# Patient Record
Sex: Female | Born: 1980 | Race: Black or African American | Hispanic: No | Marital: Single | Smoking: Former smoker
Health system: Southern US, Community
[De-identification: ages and names within clinical notes are randomized; demographics above are authoritative.]

## PROBLEM LIST (undated history)

## (undated) DIAGNOSIS — I10 Essential (primary) hypertension: Secondary | ICD-10-CM

## (undated) DIAGNOSIS — Z8489 Family history of other specified conditions: Secondary | ICD-10-CM

## (undated) DIAGNOSIS — R000 Tachycardia, unspecified: Secondary | ICD-10-CM

## (undated) DIAGNOSIS — R1115 Cyclical vomiting syndrome unrelated to migraine: Secondary | ICD-10-CM

## (undated) HISTORY — PX: CHOLECYSTECTOMY: SHX55

---

## 2005-09-20 ENCOUNTER — Emergency Department: Payer: Self-pay | Admitting: Unknown Physician Specialty

## 2005-09-21 ENCOUNTER — Emergency Department: Payer: Self-pay | Admitting: Emergency Medicine

## 2005-10-08 ENCOUNTER — Ambulatory Visit: Payer: Self-pay | Admitting: Unknown Physician Specialty

## 2006-02-04 ENCOUNTER — Emergency Department: Payer: Self-pay | Admitting: Emergency Medicine

## 2006-02-08 ENCOUNTER — Emergency Department: Payer: Self-pay | Admitting: Emergency Medicine

## 2006-02-11 ENCOUNTER — Emergency Department: Payer: Self-pay | Admitting: Emergency Medicine

## 2006-02-12 ENCOUNTER — Emergency Department: Payer: Self-pay | Admitting: Emergency Medicine

## 2006-02-12 ENCOUNTER — Ambulatory Visit: Payer: Self-pay

## 2006-04-27 ENCOUNTER — Ambulatory Visit: Payer: Self-pay | Admitting: Gastroenterology

## 2006-05-03 ENCOUNTER — Inpatient Hospital Stay: Payer: Self-pay | Admitting: Internal Medicine

## 2006-06-09 ENCOUNTER — Ambulatory Visit: Payer: Self-pay | Admitting: Surgery

## 2006-08-07 ENCOUNTER — Emergency Department: Payer: Self-pay | Admitting: Emergency Medicine

## 2006-12-18 ENCOUNTER — Emergency Department: Payer: Self-pay

## 2007-04-09 ENCOUNTER — Emergency Department: Payer: Self-pay | Admitting: Emergency Medicine

## 2007-04-10 ENCOUNTER — Emergency Department: Payer: Self-pay | Admitting: Emergency Medicine

## 2007-04-11 ENCOUNTER — Emergency Department: Payer: Self-pay | Admitting: Emergency Medicine

## 2007-04-12 ENCOUNTER — Emergency Department: Payer: Self-pay | Admitting: Emergency Medicine

## 2007-04-29 ENCOUNTER — Emergency Department: Payer: Self-pay | Admitting: Emergency Medicine

## 2007-05-31 ENCOUNTER — Ambulatory Visit: Payer: Self-pay | Admitting: Gastroenterology

## 2007-06-27 ENCOUNTER — Emergency Department: Payer: Self-pay | Admitting: Emergency Medicine

## 2007-06-29 ENCOUNTER — Emergency Department: Payer: Self-pay | Admitting: Emergency Medicine

## 2007-07-01 ENCOUNTER — Emergency Department: Payer: Self-pay | Admitting: Internal Medicine

## 2008-06-23 ENCOUNTER — Emergency Department: Payer: Self-pay | Admitting: Emergency Medicine

## 2008-06-24 ENCOUNTER — Emergency Department: Payer: Self-pay | Admitting: Emergency Medicine

## 2008-06-25 ENCOUNTER — Emergency Department: Payer: Self-pay | Admitting: Emergency Medicine

## 2008-07-25 ENCOUNTER — Emergency Department: Payer: Self-pay | Admitting: Emergency Medicine

## 2008-07-25 ENCOUNTER — Emergency Department: Payer: Self-pay | Admitting: Internal Medicine

## 2008-09-30 ENCOUNTER — Emergency Department: Payer: Self-pay | Admitting: Unknown Physician Specialty

## 2008-10-30 DIAGNOSIS — I10 Essential (primary) hypertension: Secondary | ICD-10-CM | POA: Diagnosis present

## 2008-11-17 ENCOUNTER — Emergency Department: Payer: Self-pay | Admitting: Unknown Physician Specialty

## 2008-11-18 ENCOUNTER — Emergency Department: Payer: Self-pay | Admitting: Emergency Medicine

## 2008-11-19 ENCOUNTER — Emergency Department: Payer: Self-pay | Admitting: Unknown Physician Specialty

## 2008-12-22 ENCOUNTER — Emergency Department: Payer: Self-pay | Admitting: Emergency Medicine

## 2009-01-29 ENCOUNTER — Emergency Department: Payer: Self-pay | Admitting: Emergency Medicine

## 2009-08-14 ENCOUNTER — Emergency Department: Payer: Self-pay | Admitting: Emergency Medicine

## 2012-05-18 ENCOUNTER — Emergency Department: Payer: Self-pay

## 2012-05-18 LAB — CBC
HGB: 14.2 g/dL (ref 12.0–16.0)
MCH: 31 pg (ref 26.0–34.0)
MCHC: 32.9 g/dL (ref 32.0–36.0)
MCV: 94 fL (ref 80–100)
RBC: 4.57 10*6/uL (ref 3.80–5.20)

## 2012-05-18 LAB — COMPREHENSIVE METABOLIC PANEL
Albumin: 3.9 g/dL (ref 3.4–5.0)
Alkaline Phosphatase: 89 U/L (ref 50–136)
BUN: 11 mg/dL (ref 7–18)
Bilirubin,Total: 0.6 mg/dL (ref 0.2–1.0)
Calcium, Total: 8.8 mg/dL (ref 8.5–10.1)
Chloride: 108 mmol/L — ABNORMAL HIGH (ref 98–107)
EGFR (Non-African Amer.): >60
Glucose: 96 mg/dL (ref 65–99)
SGPT (ALT): 21 U/L (ref 12–78)
Sodium: 140 mmol/L (ref 136–145)

## 2012-05-18 LAB — URINALYSIS, COMPLETE
Bacteria: NONE SEEN
Bilirubin,UR: NEGATIVE
Nitrite: NEGATIVE
RBC,UR: 5 /HPF (ref 0–5)

## 2012-07-04 ENCOUNTER — Emergency Department: Payer: Self-pay | Admitting: Emergency Medicine

## 2012-07-04 LAB — CBC
HGB: 14.5 g/dL (ref 12.0–16.0)
MCHC: 34.3 g/dL (ref 32.0–36.0)
RBC: 4.65 10*6/uL (ref 3.80–5.20)
RDW: 14.7 % — ABNORMAL HIGH (ref 11.5–14.5)
WBC: 12.1 10*3/uL — ABNORMAL HIGH (ref 3.6–11.0)

## 2012-07-04 LAB — URINALYSIS, COMPLETE
Glucose,UR: NEGATIVE mg/dL (ref 0–75)
Leukocyte Esterase: NEGATIVE
Nitrite: NEGATIVE
Ph: 6 (ref 4.5–8.0)
Protein: 100
RBC,UR: 4 /HPF (ref 0–5)
Squamous Epithelial: 6
WBC UR: 2 /HPF (ref 0–5)

## 2012-07-04 LAB — COMPREHENSIVE METABOLIC PANEL
Albumin: 3.7 g/dL (ref 3.4–5.0)
Anion Gap: 8 (ref 7–16)
Bilirubin,Total: 0.6 mg/dL (ref 0.2–1.0)
Chloride: 108 mmol/L — ABNORMAL HIGH (ref 98–107)
Co2: 23 mmol/L (ref 21–32)
Creatinine: 0.58 mg/dL — ABNORMAL LOW (ref 0.60–1.30)
EGFR (African American): >60
Osmolality: 276 (ref 275–301)
Total Protein: 7.5 g/dL (ref 6.4–8.2)

## 2012-07-04 LAB — LIPASE, BLOOD: Lipase: 73 U/L (ref 73–393)

## 2012-07-04 LAB — PREGNANCY, URINE: Pregnancy Test, Urine: NEGATIVE m[IU]/mL

## 2012-08-03 ENCOUNTER — Emergency Department: Payer: Self-pay | Admitting: Emergency Medicine

## 2012-08-03 LAB — LIPASE, BLOOD: Lipase: 55 U/L — ABNORMAL LOW (ref 73–393)

## 2012-08-03 LAB — COMPREHENSIVE METABOLIC PANEL
Albumin: 4.1 g/dL (ref 3.4–5.0)
Alkaline Phosphatase: 81 U/L (ref 50–136)
Anion Gap: 10 (ref 7–16)
Calcium, Total: 9.7 mg/dL (ref 8.5–10.1)
Chloride: 104 mmol/L (ref 98–107)
Potassium: 3.4 mmol/L — ABNORMAL LOW (ref 3.5–5.1)

## 2012-08-03 LAB — CBC
HCT: 41.8 % (ref 35.0–47.0)
MCH: 30.8 pg (ref 26.0–34.0)
MCHC: 33.9 g/dL (ref 32.0–36.0)
MCV: 91 fL (ref 80–100)
RBC: 4.6 10*6/uL (ref 3.80–5.20)

## 2012-08-03 LAB — GC/CHLAMYDIA PROBE AMP

## 2012-08-03 LAB — URINALYSIS, COMPLETE
Bacteria: NONE SEEN
Bilirubin,UR: NEGATIVE
Blood: NEGATIVE
Leukocyte Esterase: NEGATIVE
Ph: 6 (ref 4.5–8.0)
Protein: 100
RBC,UR: 4 /HPF (ref 0–5)
Squamous Epithelial: 6

## 2012-08-03 LAB — WET PREP, GENITAL

## 2012-11-23 ENCOUNTER — Emergency Department: Payer: Self-pay | Admitting: Emergency Medicine

## 2012-11-23 LAB — COMPREHENSIVE METABOLIC PANEL
Albumin: 3.9 g/dL (ref 3.4–5.0)
BUN: 8 mg/dL (ref 7–18)
Bilirubin,Total: 0.5 mg/dL (ref 0.2–1.0)
Calcium, Total: 9.2 mg/dL (ref 8.5–10.1)
Chloride: 109 mmol/L — ABNORMAL HIGH (ref 98–107)
EGFR (African American): >60
Glucose: 96 mg/dL (ref 65–99)
Osmolality: 278 (ref 275–301)
SGPT (ALT): 16 U/L (ref 12–78)
Sodium: 140 mmol/L (ref 136–145)
Total Protein: 7.3 g/dL (ref 6.4–8.2)

## 2012-11-23 LAB — CBC
HCT: 41.2 % (ref 35.0–47.0)
HGB: 14.1 g/dL (ref 12.0–16.0)
MCHC: 34.2 g/dL (ref 32.0–36.0)
MCV: 93 fL (ref 80–100)
RBC: 4.42 10*6/uL (ref 3.80–5.20)
WBC: 12 10*3/uL — ABNORMAL HIGH (ref 3.6–11.0)

## 2012-11-23 LAB — URINALYSIS, COMPLETE
Blood: NEGATIVE
Leukocyte Esterase: NEGATIVE
Protein: 100
Specific Gravity: 1.029 (ref 1.003–1.030)
Squamous Epithelial: 3

## 2013-07-15 ENCOUNTER — Emergency Department: Payer: Self-pay | Admitting: Emergency Medicine

## 2013-07-15 LAB — CBC WITH DIFFERENTIAL/PLATELET
BASOS PCT: 0.6 %
Basophil #: 0.1 10*3/uL (ref 0.0–0.1)
EOS PCT: 0.3 %
Eosinophil #: 0 10*3/uL (ref 0.0–0.7)
HCT: 45.8 % (ref 35.0–47.0)
HGB: 15 g/dL (ref 12.0–16.0)
LYMPHS ABS: 2.1 10*3/uL (ref 1.0–3.6)
Lymphocyte %: 19.5 %
MCH: 31.2 pg (ref 26.0–34.0)
MCHC: 32.8 g/dL (ref 32.0–36.0)
MCV: 95 fL (ref 80–100)
Monocyte #: 0.6 x10 3/mm (ref 0.2–0.9)
Monocyte %: 5.5 %
Neutrophil #: 8.1 10*3/uL — ABNORMAL HIGH (ref 1.4–6.5)
Neutrophil %: 74.1 %
Platelet: 291 10*3/uL (ref 150–440)
RBC: 4.82 10*6/uL (ref 3.80–5.20)
RDW: 15 % — ABNORMAL HIGH (ref 11.5–14.5)
WBC: 11 10*3/uL (ref 3.6–11.0)

## 2013-07-15 LAB — COMPREHENSIVE METABOLIC PANEL
ALBUMIN: 4.5 g/dL (ref 3.4–5.0)
AST: 18 U/L (ref 15–37)
Alkaline Phosphatase: 81 U/L
Anion Gap: 12 (ref 7–16)
BILIRUBIN TOTAL: 0.7 mg/dL (ref 0.2–1.0)
BUN: 15 mg/dL (ref 7–18)
CALCIUM: 10.4 mg/dL — AB (ref 8.5–10.1)
CREATININE: 0.84 mg/dL (ref 0.60–1.30)
Chloride: 105 mmol/L (ref 98–107)
Co2: 23 mmol/L (ref 21–32)
EGFR (African American): >60
EGFR (Non-African Amer.): >60
GLUCOSE: 118 mg/dL — AB (ref 65–99)
OSMOLALITY: 281 (ref 275–301)
POTASSIUM: 3.9 mmol/L (ref 3.5–5.1)
SGPT (ALT): 17 U/L (ref 12–78)
Sodium: 140 mmol/L (ref 136–145)
Total Protein: 8.5 g/dL — ABNORMAL HIGH (ref 6.4–8.2)

## 2013-07-15 LAB — URINALYSIS, COMPLETE
Bilirubin,UR: NEGATIVE
Blood: NEGATIVE
GLUCOSE, UR: NEGATIVE mg/dL (ref 0–75)
Ketone: NEGATIVE
Leukocyte Esterase: NEGATIVE
NITRITE: NEGATIVE
PH: 6 (ref 4.5–8.0)
SPECIFIC GRAVITY: 1.024 (ref 1.003–1.030)
Squamous Epithelial: 3
WBC UR: 2 /HPF (ref 0–5)

## 2014-01-15 ENCOUNTER — Emergency Department: Payer: Self-pay | Admitting: Emergency Medicine

## 2014-01-16 LAB — COMPREHENSIVE METABOLIC PANEL
ALT: 20 U/L
AST: 24 U/L (ref 15–37)
Albumin: 3.9 g/dL (ref 3.4–5.0)
Alkaline Phosphatase: 71 U/L
Anion Gap: 11 (ref 7–16)
BUN: 17 mg/dL (ref 7–18)
Bilirubin,Total: 0.5 mg/dL (ref 0.2–1.0)
CHLORIDE: 113 mmol/L — AB (ref 98–107)
Calcium, Total: 8.7 mg/dL (ref 8.5–10.1)
Co2: 24 mmol/L (ref 21–32)
Creatinine: 0.8 mg/dL (ref 0.60–1.30)
EGFR (African American): >60
EGFR (Non-African Amer.): >60
Glucose: 96 mg/dL (ref 65–99)
OSMOLALITY: 296 (ref 275–301)
Potassium: 3.6 mmol/L (ref 3.5–5.1)
SODIUM: 148 mmol/L — AB (ref 136–145)
TOTAL PROTEIN: 8 g/dL (ref 6.4–8.2)

## 2014-01-16 LAB — CBC
HCT: 39 % (ref 35.0–47.0)
HGB: 12.7 g/dL (ref 12.0–16.0)
MCH: 31.4 pg (ref 26.0–34.0)
MCHC: 32.5 g/dL (ref 32.0–36.0)
MCV: 97 fL (ref 80–100)
Platelet: 272 10*3/uL (ref 150–440)
RBC: 4.04 10*6/uL (ref 3.80–5.20)
RDW: 14.5 % (ref 11.5–14.5)
WBC: 22.7 10*3/uL — ABNORMAL HIGH (ref 3.6–11.0)

## 2014-01-16 LAB — LIPASE, BLOOD: Lipase: 63 U/L — ABNORMAL LOW (ref 73–393)

## 2014-01-16 LAB — TROPONIN I: Troponin-I: <0.02 ng/mL

## 2014-02-12 ENCOUNTER — Emergency Department: Payer: Self-pay | Admitting: Emergency Medicine

## 2014-03-26 ENCOUNTER — Emergency Department: Payer: Self-pay | Admitting: Emergency Medicine

## 2014-03-26 LAB — CBC WITH DIFFERENTIAL/PLATELET
COMMENT - H1-COM1: NORMAL
Eosinophil: 1 %
HCT: 43.1 % (ref 35.0–47.0)
HGB: 14.3 g/dL (ref 12.0–16.0)
Lymphocytes: 33 %
MCH: 31.4 pg (ref 26.0–34.0)
MCHC: 33.2 g/dL (ref 32.0–36.0)
MCV: 95 fL (ref 80–100)
Monocytes: 5 %
Platelet: 284 10*3/uL (ref 150–440)
RBC: 4.56 10*6/uL (ref 3.80–5.20)
RDW: 14.6 % — ABNORMAL HIGH (ref 11.5–14.5)
Segmented Neutrophils: 61 %
WBC: 11.1 10*3/uL — ABNORMAL HIGH (ref 3.6–11.0)

## 2014-03-26 LAB — COMPREHENSIVE METABOLIC PANEL
ALK PHOS: 70 U/L (ref 46–116)
Albumin: 3.6 g/dL (ref 3.4–5.0)
Anion Gap: 7 (ref 7–16)
BILIRUBIN TOTAL: 0.4 mg/dL (ref 0.2–1.0)
BUN: 11 mg/dL (ref 7–18)
CO2: 25 mmol/L (ref 21–32)
Calcium, Total: 9.5 mg/dL (ref 8.5–10.1)
Chloride: 107 mmol/L (ref 98–107)
Creatinine: 0.65 mg/dL (ref 0.60–1.30)
EGFR (African American): >60
Glucose: 74 mg/dL (ref 65–99)
Osmolality: 276 (ref 275–301)
Potassium: 3.8 mmol/L (ref 3.5–5.1)
SGOT(AST): 29 U/L (ref 15–37)
SGPT (ALT): 22 U/L (ref 14–63)
SODIUM: 139 mmol/L (ref 136–145)
TOTAL PROTEIN: 7.6 g/dL (ref 6.4–8.2)

## 2014-03-26 LAB — URINALYSIS, COMPLETE
BLOOD: NEGATIVE
Bacteria: NONE SEEN
Bilirubin,UR: NEGATIVE
GLUCOSE, UR: NEGATIVE mg/dL (ref 0–75)
Ketone: NEGATIVE
Leukocyte Esterase: NEGATIVE
Nitrite: NEGATIVE
PH: 6 (ref 4.5–8.0)
Protein: NEGATIVE
RBC,UR: 1 /HPF (ref 0–5)
SPECIFIC GRAVITY: 1.018 (ref 1.003–1.030)
WBC UR: 2 /HPF (ref 0–5)

## 2014-03-26 LAB — HCG, QUANTITATIVE, PREGNANCY: BETA HCG, QUANT.: 146 m[IU]/mL — AB

## 2014-03-26 LAB — LIPASE, BLOOD: LIPASE: 120 U/L (ref 73–393)

## 2014-04-08 ENCOUNTER — Emergency Department: Payer: Self-pay | Admitting: Internal Medicine

## 2014-04-09 ENCOUNTER — Emergency Department: Payer: Self-pay | Admitting: Emergency Medicine

## 2014-11-18 ENCOUNTER — Emergency Department
Admission: EM | Admit: 2014-11-18 | Discharge: 2014-11-18 | Disposition: A | Discharge status: Home / Self Care | Payer: Self-pay | Attending: Emergency Medicine | Admitting: Emergency Medicine

## 2014-11-18 DIAGNOSIS — Z3202 Encounter for pregnancy test, result negative: Secondary | ICD-10-CM | POA: Insufficient documentation

## 2014-11-18 DIAGNOSIS — G43D0 Abdominal migraine, not intractable: Secondary | ICD-10-CM | POA: Insufficient documentation

## 2014-11-18 LAB — CBC
HCT: 44.8 % (ref 35.0–47.0)
Hemoglobin: 14.8 g/dL (ref 12.0–16.0)
MCH: 31 pg (ref 26.0–34.0)
MCHC: 32.9 g/dL (ref 32.0–36.0)
MCV: 94 fL (ref 80.0–100.0)
PLATELETS: 264 10*3/uL (ref 150–440)
RBC: 4.77 MIL/uL (ref 3.80–5.20)
RDW: 14.5 % (ref 11.5–14.5)
WBC: 9.9 10*3/uL (ref 3.6–11.0)

## 2014-11-18 LAB — COMPREHENSIVE METABOLIC PANEL
ALBUMIN: 4.3 g/dL (ref 3.5–5.0)
ALK PHOS: 71 U/L (ref 38–126)
ALT: 12 U/L — AB (ref 14–54)
AST: 19 U/L (ref 15–41)
Anion gap: 10 (ref 5–15)
BILIRUBIN TOTAL: 0.5 mg/dL (ref 0.3–1.2)
BUN: 17 mg/dL (ref 6–20)
CALCIUM: 9.4 mg/dL (ref 8.9–10.3)
CO2: 21 mmol/L — AB (ref 22–32)
CREATININE: 0.82 mg/dL (ref 0.44–1.00)
Chloride: 108 mmol/L (ref 101–111)
GFR calc Af Amer: >60 mL/min (ref >60)
GFR calc non Af Amer: >60 mL/min (ref >60)
GLUCOSE: 101 mg/dL — AB (ref 65–99)
Potassium: 4.1 mmol/L (ref 3.5–5.1)
SODIUM: 139 mmol/L (ref 135–145)
TOTAL PROTEIN: 8.1 g/dL (ref 6.5–8.1)

## 2014-11-18 LAB — URINALYSIS COMPLETE WITH MICROSCOPIC (ARMC ONLY)
BILIRUBIN URINE: NEGATIVE
Bacteria, UA: NONE SEEN
GLUCOSE, UA: NEGATIVE mg/dL
KETONES UR: NEGATIVE mg/dL
Leukocytes, UA: NEGATIVE
Nitrite: NEGATIVE
PROTEIN: 30 mg/dL — AB
Specific Gravity, Urine: 1.029 (ref 1.005–1.030)
pH: 5 (ref 5.0–8.0)

## 2014-11-18 LAB — LIPASE, BLOOD: Lipase: 19 U/L — ABNORMAL LOW (ref 22–51)

## 2014-11-18 LAB — POCT PREGNANCY, URINE: Preg Test, Ur: NEGATIVE

## 2014-11-18 MED ORDER — PROMETHAZINE HCL 12.5 MG RE SUPP: 12.5000 mg | Freq: Four times a day (QID) | RECTAL | Status: DC | PRN

## 2014-11-18 MED ORDER — PROMETHAZINE HCL 25 MG/ML IJ SOLN
12.5000 mg | Freq: Once | INTRAMUSCULAR | Status: AC
  Administered 2014-11-18: 12.5 mg via INTRAVENOUS
  Filled 2014-11-18: qty 1

## 2014-11-18 MED ORDER — LORAZEPAM 2 MG/ML IJ SOLN
0.5000 mg | Freq: Once | INTRAMUSCULAR | Status: AC
  Administered 2014-11-18: 0.5 mg via INTRAVENOUS
  Filled 2014-11-18: qty 1

## 2014-11-18 MED ORDER — SODIUM CHLORIDE 0.9 % IV BOLUS (SEPSIS)
1000.0000 mL | Freq: Once | INTRAVENOUS | Status: AC
  Administered 2014-11-18: 1000 mL via INTRAVENOUS

## 2014-11-18 NOTE — ED Notes (Signed)
Pt tolerated PO fluids well, no emesis noted per pt, informed Dr Huel Cote.

## 2014-11-18 NOTE — ED Notes (Signed)
Pt reports that she has cyclic vomiting syndrome and since Friday has been having vomiting. Pt also  Reports that she has been having migraine headaches.

## 2014-11-18 NOTE — Discharge Instructions (Signed)
Abdominal Migraine Abdominal migraine is one of several types of migraine. It is one example of "periodic syndrome". The periodic type more commonly occurs in children. Such children usually have a family history of migraine. Children may go on to develop typical migraines later in their lives.  SYMPTOMS  The attacks usually include intermittent periods of abdominal pain. Along with the abdominal pain, other symptoms may occur such as:  Nausea.  Vomiting.  Intense blushing or reddening of the skin (flushing).  Pale appearance to the skin (pallor). DIAGNOSIS  Tests may be done to look for other possible conditions. TREATMENT  Medications that are useful in treating migraine may also work to control these attacks in most children. Document Released: 05/02/2003 Document Revised: 06/06/2012 Document Reviewed: 09/28/2007 [facility] Patient Information 2015 [place], [place]. This information is not intended to replace advice given to you by your health care provider. Make sure you discuss any questions you have with your health care provider.  Please return immediately if condition worsens. Please contact her primary physician or the physician you were given for referral. If you have any specialist physicians involved in her treatment and plan please also contact them. Thank you for using [facility].

## 2014-11-18 NOTE — ED Provider Notes (Signed)
Time Seen: Approximately 1430 I have reviewed the triage notes  Chief Complaint: Emesis   History of Present Illness: Theresa Carpenter is a 34 y.o. female who presents with acute episode of cyclic vomiting with a long history of abdominal migraines and cyclic vomiting syndrome. Followed by gastroenterologist at [facility] and took her typical Xanax and Maxalt without any successful relief of the nausea and vomiting etc. Patient denies any hematemesis or biliary emesis. She denies any fever. She denies any diarrhea or constipation. Occasional crampy abdominal pain which was typical of her previous episodes of intestinal migraines. She thought there may be a risk of being pregnant. She denies any vaginal discharge or bleeding.   No past medical history on file.  There are no active problems to display for this patient.   No past surgical history on file.  No past surgical history on file.  No current outpatient prescriptions on file.  Allergies:  Review of patient's allergies indicates no known allergies.  Family History: No family history on file.  Social History: Social History  Substance Use Topics  . Smoking status: Not on file  . Smokeless tobacco: Not on file  . Alcohol Use: Not on file     Review of Systems:   10 point review of systems was performed and was otherwise negative:  Constitutional: No fever Eyes: No visual disturbances ENT: No sore throat, ear pain Cardiac: No chest pain Respiratory: No shortness of breath, wheezing, or stridor Abdomen: No abdominal pain, no vomiting, No diarrhea Endocrine: No weight loss, No night sweats Extremities: No peripheral edema, cyanosis Skin: No rashes, easy bruising Neurologic: No focal weakness, trouble with speech or swollowing Urologic: No dysuria, Hematuria, or urinary frequency   Physical Exam:  ED Triage Vitals  Enc Vitals Group     BP 11/18/14 1251 138/80 mmHg     Pulse Rate 11/18/14 1251 138     Resp 11/18/14  1251 20     Temp 11/18/14 1251 98.2 F (36.8 C)     Temp Source 11/18/14 1251 Oral     SpO2 11/18/14 1251 97 %     Weight 11/18/14 1251 150 lb (68.04 kg)     Height 11/18/14 1251  (1.6 m)     Head Cir --      Peak Flow --      Pain Score 11/18/14 1329 10     Pain Loc --      Pain Edu? --      Excl. in GC? --     General: Awake , Alert , and Oriented times 3; GCS 15 Head: Normal cephalic , atraumatic Eyes: Pupils equal , round, reactive to light Nose/Throat: No nasal drainage, patent upper airway without erythema or exudate.  Neck: Supple, Full range of motion, No anterior adenopathy or palpable thyroid masses Lungs: Clear to ascultation without wheezes , rhonchi, or rales Heart: Regular rate, regular rhythm without murmurs , gallops , or rubs Abdomen: Soft, non tender without rebound, guarding , or rigidity; bowel sounds positive and symmetric in all 4 quadrants. No organomegaly .        Extremities: 2 plus symmetric pulses. No edema, clubbing or cyanosis Neurologic: normal ambulation, Motor symmetric without deficits, sensory intact Skin: warm, dry, no rashes   Labs:   All laboratory work was reviewed including any pertinent negatives or positives listed below:  Labs Reviewed  LIPASE, BLOOD - Abnormal; Notable for the following:    Lipase 19 (*)  All other components within normal limits  COMPREHENSIVE METABOLIC PANEL - Abnormal; Notable for the following:    CO2 21 (*)    Glucose, Bld 101 (*)    ALT 12 (*)    All other components within normal limits  URINALYSIS COMPLETEWITH MICROSCOPIC ([facility] ONLY) - Abnormal; Notable for the following:    Color, Urine YELLOW (*)    APPearance HAZY (*)    Hgb urine dipstick 2+ (*)    Protein, ur 30 (*)    Squamous Epithelial / LPF 0-5 (*)    All other components within normal limits  CBC  POC URINE PREG, ED  POCT PREGNANCY, URINE   review of patient's laboratory work shows no significant abnormalities.      ED  Course: Reviewed the patient's medical record shows that she has a history of substance abuse and was concerned about giving her any form of narcotic and wanted to be careful with any benzodiazepine therapy. Patient does appear to be of understanding when discussing this issue at the bedside. The patient will be given IV fluid along with IV low-dose Ativan and Phenergan 12.5 mg IV. Patient appears to have medication at home and does not appear to have any complications from the cyclic vomiting. The patient does not appear to have any signs or symptoms consistent with an acute abdomen. Her exam is benign at this time and she's had a long history of intestinal migraines.   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 Patient was advised to follow up with her primary care physician or other specialized physicians involved and in their current assessment. I prescribed the patient's Phenergan suppositories and she's been advised continue her normal medication and contact her gastroenterologist on Monday for any further advisement.           Jennye Moccasin, MD 11/18/14 [phone]

## 2014-11-28 ENCOUNTER — Emergency Department: Payer: Self-pay

## 2014-11-28 ENCOUNTER — Emergency Department
Admission: EM | Admit: 2014-11-28 | Discharge: 2014-11-28 | Disposition: A | Discharge status: Home / Self Care | Payer: Self-pay | Attending: Emergency Medicine | Admitting: Emergency Medicine

## 2014-11-28 ENCOUNTER — Encounter: Payer: Self-pay | Admitting: Emergency Medicine

## 2014-11-28 DIAGNOSIS — I10 Essential (primary) hypertension: Secondary | ICD-10-CM | POA: Insufficient documentation

## 2014-11-28 DIAGNOSIS — Z3202 Encounter for pregnancy test, result negative: Secondary | ICD-10-CM | POA: Insufficient documentation

## 2014-11-28 DIAGNOSIS — N760 Acute vaginitis: Secondary | ICD-10-CM | POA: Insufficient documentation

## 2014-11-28 DIAGNOSIS — R102 Pelvic and perineal pain: Secondary | ICD-10-CM

## 2014-11-28 DIAGNOSIS — B9689 Other specified bacterial agents as the cause of diseases classified elsewhere: Secondary | ICD-10-CM

## 2014-11-28 DIAGNOSIS — Z720 Tobacco use: Secondary | ICD-10-CM | POA: Insufficient documentation

## 2014-11-28 HISTORY — DX: Essential (primary) hypertension: I10

## 2014-11-28 HISTORY — DX: Cyclical vomiting syndrome unrelated to migraine: R11.15

## 2014-11-28 LAB — URINALYSIS COMPLETE WITH MICROSCOPIC (ARMC ONLY)
BACTERIA UA: NONE SEEN
Bilirubin Urine: NEGATIVE
Glucose, UA: NEGATIVE mg/dL
Ketones, ur: NEGATIVE mg/dL
Nitrite: NEGATIVE
PROTEIN: NEGATIVE mg/dL
Specific Gravity, Urine: 1.023 (ref 1.005–1.030)
pH: 6 (ref 5.0–8.0)

## 2014-11-28 LAB — CHLAMYDIA/NGC RT PCR (ARMC ONLY)
Chlamydia Tr: NOT DETECTED
N gonorrhoeae: NOT DETECTED

## 2014-11-28 LAB — WET PREP, GENITAL
Trich, Wet Prep: NONE SEEN
WBC WET PREP: NONE SEEN
YEAST WET PREP: NONE SEEN

## 2014-11-28 LAB — POCT PREGNANCY, URINE: Preg Test, Ur: NEGATIVE

## 2014-11-28 MED ORDER — METRONIDAZOLE 500 MG PO TABS: 2000.0000 mg | ORAL_TABLET | Freq: Two times a day (BID) | ORAL | Status: AC

## 2014-11-28 MED ORDER — METRONIDAZOLE 250 MG PO TABS
500.0000 mg | ORAL_TABLET | Freq: Two times a day (BID) | ORAL | Status: DC
  Administered 2014-11-28: 500 mg via ORAL
  Filled 2014-11-28: qty 2

## 2014-11-28 NOTE — ED Notes (Signed)
NAD noted at this time. Pt states she has a hx of UTI's. Pt states burning and frequency x 3 days. Pt states she has been taking AZO, cranberry juice, and increased water intake over the last 3 days with no relief. Pt states she thinks she may have an STD. Pt denies D/C and bleeding at this time.

## 2014-11-28 NOTE — ED Provider Notes (Signed)
[facility] Emergency Department Provider Note ____________________________________________  Time seen: 1920  I have reviewed the triage vital signs and the nursing notes.  HISTORY  Chief Complaint  Urinary Tract Infection and SEXUALLY TRANSMITTED DISEASE  HPI Theresa Carpenter is a 34 y.o. female reports to the ED for evaluation of some dysuria over the last 2 weeks. As well as some vaginal pain over the last week. She reports using AZO and drinking cranberry juice for her urinary symptoms. She denies any fevers, chills, sweats, or nausea or vomiting. Just denies any significant vaginal discharge or irregular bleeding. She rates her vaginal discomfort that she localizes to the vagina, as a 9/10 in triage. Symptoms are aggravated by intercourse.  Past Medical History  Diagnosis Date  . Hypertension   . Cyclic vomiting syndrome     There are no active problems to display for this patient.   Past Surgical History  Procedure Laterality Date  . Cholecystectomy      Current Outpatient Rx  Name  Route  Sig  Dispense  Refill  . metroNIDAZOLE (FLAGYL) 500 MG tablet   Oral   Take 4 tablets (2,000 mg total) by mouth 2 (two) times daily.   13 tablet   0   . promethazine (PHENERGAN) 12.5 MG suppository   Rectal   Place 1 suppository (12.5 mg total) rectally every 6 (six) hours as needed for nausea or vomiting.   12 each   0    Allergies Review of patient's allergies indicates no known allergies.  History reviewed. No pertinent family history.  Social History Social History  Substance Use Topics  . Smoking status: Current Every Day Smoker -- 0.30 packs/day    Types: Cigarettes  . Smokeless tobacco: Never Used  . Alcohol Use: No   Review of Systems  Constitutional: Negative for fever. Eyes: Negative for visual changes. ENT: Negative for sore throat. Cardiovascular: Negative for chest pain. Respiratory: Negative for shortness of  breath. Gastrointestinal: Negative for abdominal pain, vomiting and diarrhea. Genitourinary: Negative for dysuria. Vulvovaginal pain as above. Musculoskeletal: Negative for back pain. Skin: Negative for rash. Neurological: Negative for headaches, focal weakness or numbness. ____________________________________________  PHYSICAL EXAM:  VITAL SIGNS: ED Triage Vitals  Enc Vitals Group     BP 11/28/14 1843 151/107 mmHg     Pulse Rate 11/28/14 1843 116     Resp 11/28/14 1837 20     Temp 11/28/14 1837 98 F (36.7 C)     Temp Source 11/28/14 1837 Oral     SpO2 11/28/14 1837 100 %     Weight 11/28/14 1843 150 lb (68.04 kg)     Height 11/28/14 1843  (1.6 m)     Head Cir --      Peak Flow --      Pain Score 11/28/14 1844 7     Pain Loc --      Pain Edu? --      Excl. in GC? --    Constitutional: Alert and oriented. Well appearing and in no distress. Eyes: Conjunctivae are normal. PERRL. Normal extraocular movements. ENT   Head: Normocephalic and atraumatic.   Nose: No congestion/rhinorrhea.   Mouth/Throat: Mucous membranes are moist.   Neck: Supple. No thyromegaly. Hematological/Lymphatic/Immunological: No cervical lymphadenopathy. Cardiovascular: Normal rate, regular rhythm.  Respiratory: Normal respiratory effort. No wheezes/rales/rhonchi. Gastrointestinal: Soft and nontender. No distention. GU: Normal external genitalia. Vaginal exam without any significant vaginal discharge. Cervix is closed and nonfriable. No adnexal masses or  CMT noted. Musculoskeletal: Nontender with normal range of motion in all extremities.  Neurologic:  Normal gait without ataxia. Normal speech and language. No gross focal neurologic deficits are appreciated. Skin:  Skin is warm, dry and intact. No rash noted. Psychiatric: Mood and affect are normal. Patient exhibits appropriate insight and judgment. ____________________________________________   LABS (pertinent  positives/negatives) Labs Reviewed  WET PREP, GENITAL - Abnormal; Notable for the following:    Clue Cells Wet Prep HPF POC FEW (*)    All other components within normal limits  URINALYSIS COMPLETEWITH MICROSCOPIC ([facility] ONLY) - Abnormal; Notable for the following:    Color, Urine YELLOW (*)    APPearance CLOUDY (*)    Hgb urine dipstick 1+ (*)    Leukocytes, UA TRACE (*)    Squamous Epithelial / LPF 6-30 (*)    All other components within normal limits  CHLAMYDIA/NGC RT PCR ([facility] ONLY)  POCT PREGNANCY, URINE  ____________________________________________   RADIOLOGY  Pelvic/Transvaginal US IMPRESSION: Study within normal limits. ____________________________________________  PROCEDURES  Flagyl 500 mg PO ____________________________________________  INITIAL IMPRESSION / ASSESSMENT AND PLAN / ED COURSE  Patient with laboratory evaluation of bacterial vaginosis. She has a negative ultrasound, without indication of endometriosis, fibroids, or ovarian cyst. She'll be discharged with Flagyl to dose as directed. She'll follow with the primary care provider or the [facility] Department as needed. ____________________________________________  FINAL CLINICAL IMPRESSION(S) / ED DIAGNOSES  Final diagnoses:  Pelvic pain in female  BV (bacterial vaginosis)      Lissa Hoard, PA-C 11/29/14 0007  Darien Ramus, MD 12/05/14 [phone]

## 2014-11-28 NOTE — Discharge Instructions (Signed)
Bacterial Vaginosis Bacterial vaginosis is a vaginal infection that occurs when the normal balance of bacteria in the vagina is disrupted. It results from an overgrowth of certain bacteria. This is the most common vaginal infection in women of childbearing age. Treatment is important to prevent complications, especially in pregnant women, as it can cause a premature delivery. CAUSES  Bacterial vaginosis is caused by an increase in harmful bacteria that are normally present in smaller amounts in the vagina. Several different kinds of bacteria can cause bacterial vaginosis. However, the reason that the condition develops is not fully understood. RISK FACTORS Certain activities or behaviors can put you at an increased risk of developing bacterial vaginosis, including:  Having a new sex partner or multiple sex partners.  Douching.  Using an intrauterine device (IUD) for contraception. Women do not get bacterial vaginosis from toilet seats, bedding, swimming pools, or contact with objects around them. SIGNS AND SYMPTOMS  Some women with bacterial vaginosis have no signs or symptoms. Common symptoms include:  Grey vaginal discharge.  A fishlike odor with discharge, especially after sexual intercourse.  Itching or burning of the vagina and vulva.  Burning or pain with urination. DIAGNOSIS  Your health care provider will take a medical history and examine the vagina for signs of bacterial vaginosis. A sample of vaginal fluid may be taken. Your health care provider will look at this sample under a microscope to check for bacteria and abnormal cells. A vaginal pH test may also be done.  TREATMENT  Bacterial vaginosis may be treated with antibiotic medicines. These may be given in the form of a pill or a vaginal cream. A second round of antibiotics may be prescribed if the condition comes back after treatment. Because bacterial vaginosis increases your risk for sexually transmitted diseases, getting  treated can help reduce your risk for chlamydia, gonorrhea, HIV, and herpes. HOME CARE INSTRUCTIONS   Only take over-the-counter or prescription medicines as directed by your health care provider.  If antibiotic medicine was prescribed, take it as directed. Make sure you finish it even if you start to feel better.  Tell all sexual partners that you have a vaginal infection. They should see their health care provider and be treated if they have problems, such as a mild rash or itching.  During treatment, it is important that you follow these instructions:  Avoid sexual activity or use condoms correctly.  Do not douche.  Avoid alcohol as directed by your health care provider.  Avoid breastfeeding as directed by your health care provider. SEEK MEDICAL CARE IF:   Your symptoms are not improving after 3 days of treatment.  You have increased discharge or pain.  You have a fever. MAKE SURE YOU:   Understand these instructions.  Will watch your condition.  Will get help right away if you are not doing well or get worse. FOR MORE INFORMATION  Centers for Disease Control and Prevention, Division of STD Prevention: SolutionApps.[employer].za American Sexual Health Association (ASHA): www.ashastd.org    This information is not intended to replace advice given to you by your health care provider. Make sure you discuss any questions you have with your health care provider.   Document Released: 02/09/2005 Document Revised: 03/02/2014 Document Reviewed: 09/21/2012 Elsevier Interactive Patient Education 2016 Else[employer] Inc[employer]  Pelvic Pain, Female Pelvic pain is pain felt below the belly button and between your hips. It can be caused by many different things. It is important to get help right away. This is especially true  for severe, sharp, or unusual pain that comes on suddenly.  HOME CARE  Only take medicine as told by your doctor.  Rest as told by your doctor.  Eat a healthy diet, such as  fruits, vegetables, and lean meats.  Drink enough fluids to keep your pee (urine) clear or pale yellow, or as told.  Avoid sex (intercourse) if it causes pain.  Apply warm or cold packs to your lower belly (abdomen). Use the type of pack that helps the pain.  Avoid situations that cause you stress.  Keep a journal to track your pain. Write down:  When the pain started.  Where it is located.  If there are things that seem to be related to the pain, such as food or your period.  Follow up with your doctor as told. GET HELP RIGHT AWAY IF:   You have heavy bleeding from the vagina.  You have more pelvic pain.  You feel lightheaded or pass out (faint).  You have chills.  You have pain when you pee or have blood in your pee.  You cannot stop having watery poop (diarrhea).  You cannot stop throwing up (vomiting).  You have a fever or lasting symptoms for more than 3 days.  You have a fever and your symptoms suddenly get worse.  You are being physically or sexually abused.  Your medicine does not help your pain.  You have fluid (discharge) coming from your vagina that is not normal. MAKE SURE YOU:  Understand these instructions.  Will watch your condition.  Will get help if you are not doing well or get worse.   This information is not intended to replace advice given to you by your health care provider. Make sure you discuss any questions you have with your health care provider.   Document Released: 07/29/2007 Document Revised: 03/02/2014 Document Reviewed: 06/01/2011 Elsevier Interactive Patient Education [employer]   Take the antibiotic as directed until completely gone. Follow-up with your provider as needed for ongoing symptoms.

## 2014-11-28 NOTE — ED Notes (Signed)
AAOx3.  Skin warm and dry.  NAD 

## 2014-11-29 ENCOUNTER — Telehealth: Payer: Self-pay | Admitting: Emergency Medicine

## 2014-11-29 NOTE — ED Notes (Signed)
[facility] called to clarify flagyl dosing.  Per dr Mayford Knife flagyl 500 mg twice a day for 7 days.

## 2015-08-18 ENCOUNTER — Emergency Department
Admission: EM | Admit: 2015-08-18 | Discharge: 2015-08-18 | Disposition: A | Discharge status: Home / Self Care | Payer: Medicaid Other | Attending: Student | Admitting: Student

## 2015-08-18 ENCOUNTER — Encounter: Payer: Self-pay | Admitting: Emergency Medicine

## 2015-08-18 DIAGNOSIS — F17210 Nicotine dependence, cigarettes, uncomplicated: Secondary | ICD-10-CM | POA: Insufficient documentation

## 2015-08-18 DIAGNOSIS — I10 Essential (primary) hypertension: Secondary | ICD-10-CM | POA: Insufficient documentation

## 2015-08-18 DIAGNOSIS — L02512 Cutaneous abscess of left hand: Secondary | ICD-10-CM | POA: Insufficient documentation

## 2015-08-18 DIAGNOSIS — F1290 Cannabis use, unspecified, uncomplicated: Secondary | ICD-10-CM | POA: Insufficient documentation

## 2015-08-18 MED ORDER — SULFAMETHOXAZOLE-TRIMETHOPRIM 800-160 MG PO TABS: 1.0000 | ORAL_TABLET | Freq: Two times a day (BID) | ORAL | Status: DC

## 2015-08-18 MED ORDER — TRAMADOL HCL 50 MG PO TABS: 50.0000 mg | ORAL_TABLET | Freq: Four times a day (QID) | ORAL | Status: DC | PRN

## 2015-08-18 MED ORDER — SULFAMETHOXAZOLE-TRIMETHOPRIM 800-160 MG PO TABS
ORAL_TABLET | ORAL | Status: AC
  Administered 2015-08-18: 1 via ORAL
  Filled 2015-08-18: qty 1

## 2015-08-18 MED ORDER — SULFAMETHOXAZOLE-TRIMETHOPRIM 800-160 MG PO TABS
1.0000 | ORAL_TABLET | Freq: Once | ORAL | Status: AC
  Administered 2015-08-18: 1 via ORAL

## 2015-08-18 MED ORDER — NAPROXEN 500 MG PO TABS
500.0000 mg | ORAL_TABLET | Freq: Once | ORAL | Status: AC
  Administered 2015-08-18: 500 mg via ORAL
  Filled 2015-08-18: qty 1

## 2015-08-18 MED ORDER — NAPROXEN 500 MG PO TABS: 500.0000 mg | ORAL_TABLET | Freq: Two times a day (BID) | ORAL | Status: AC

## 2015-08-18 NOTE — Discharge Instructions (Signed)

## 2015-08-18 NOTE — ED Notes (Signed)
Pt states began to experience pain and swelling to lower left index finger 3 days pta. Pt with abscess noted to left index finger from mip and pip joint. Pt denies drainage. Cap refill to finger is less than 2 seconds.

## 2015-08-18 NOTE — ED Provider Notes (Signed)
[facility] Emergency Department Provider Note  ____________________________________________  Time seen: Approximately 9:12 PM  I have reviewed the triage vital signs and the nursing notes.   HISTORY  Chief Complaint Abscess   HPI Theresa Carpenter is a 35 y.o. female who presents to the emergency department for evaluation of abscess to the left hand. She states she had a very similar incident 2 years ago. She reports having to have an abscess drained in the exact same place and doesn't feel that it ever completely went away. She states that the pain started in the index finger about 3 days ago and today noticed that the pustules were back and there is redness and swelling, just like before. She states that over the day, it has gotten worse. She has not taken anything for pain today.  Past Medical History  Diagnosis Date  . Hypertension   . Cyclic vomiting syndrome     There are no active problems to display for this patient.   Past Surgical History  Procedure Laterality Date  . Cholecystectomy      Current Outpatient Rx  Name  Route  Sig  Dispense  Refill  . naproxen (NAPROSYN) 500 MG tablet   Oral   Take 1 tablet (500 mg total) by mouth 2 (two) times daily with a meal.   60 tablet   0   . promethazine (PHENERGAN) 12.5 MG suppository   Rectal   Place 1 suppository (12.5 mg total) rectally every 6 (six) hours as needed for nausea or vomiting.   12 each   0   . sulfamethoxazole-trimethoprim (BACTRIM DS,SEPTRA DS) 800-160 MG tablet   Oral   Take 1 tablet by mouth 2 (two) times daily.   20 tablet   0   . traMADol (ULTRAM) 50 MG tablet   Oral   Take 1 tablet (50 mg total) by mouth every 6 (six) hours as needed.   12 tablet   0     Allergies Review of patient's allergies indicates no known allergies.  No family history on file.  Social History Social History  Substance Use Topics  . Smoking status: Current Every Day Smoker -- 0.30  packs/day    Types: Cigarettes  . Smokeless tobacco: Never Used  . Alcohol Use: No    Review of Systems  Constitutional: Negative for fever/chills Respiratory: Negative for shortness of breath. Musculoskeletal: Negtive for pain. Skin: Positive for swelling, tenderness, and abscess. Neurological: Negative for headaches, focal weakness or numbness. ____________________________________________   PHYSICAL EXAM:  VITAL SIGNS: ED Triage Vitals  Enc Vitals Group     BP 08/18/15 1951 140/91 mmHg     Pulse Rate 08/18/15 1951 102     Resp 08/18/15 1951 16     Temp 08/18/15 1951 99.3 F (37.4 C)     Temp Source 08/18/15 1951 Oral     SpO2 08/18/15 1951 100 %     Weight 08/18/15 1951 136 lb (61.689 kg)     Height 08/18/15 1951 5\' 3"  (1.6 m)     Head Cir --      Peak Flow --      Pain Score 08/18/15 1952 10     Pain Loc --      Pain Edu? --      Excl. in GC? --      Constitutional: Alert and oriented. Well appearing and in no acute distress. Eyes: Conjunctivae are normal. EOMI. Nose: No congestion/rhinnorhea. Mouth/Throat: Mucous membranes are moist.  Cardiovascular: Good peripheral circulation. Respiratory: Normal respiratory effort.  No retractions. Musculoskeletal: Limited flexion of the MCP and PIP of the index finger of the left hand due to swelling and pain. Neurologic:  Normal speech and language. No gross focal neurologic deficits are appreciated. Skin:  Raised fluctuant pustules noted on the palmar aspect of the left index finger between the MCP and PIP.  ____________________________________________   LABS (all labs ordered are listed, but only abnormal results are displayed)  Labs Reviewed  AEROBIC CULTURE (SUPERFICIAL SPECIMEN)   ____________________________________________  EKG   ____________________________________________  RADIOLOGY   ____________________________________________   PROCEDURES  Procedure(s) performed:  INCISION AND  DRAINAGE Performed by: Rahmir Beever Consent: Verbal consent Kem Boroughsobtained. Risks and benefits: risks, benefits and alternatives were discussed Type: abscess  Body area: left index finger  Anesthesia: Pain Ese  Needle aspiration of pustules just under the surface of the skin  Drainage: purulent/clear  Drainage amount: scant  Patient tolerance: Patient tolerated the procedure well with no immediate complications. Specifically, no decrease in ROM of the digit    ____________________________________________   INITIAL IMPRESSION / ASSESSMENT AND PLAN / ED COURSE  Pertinent labs & imaging results that were available during my care of the patient were reviewed by me and considered in my medical decision making (see chart for details).  She will be advised to follow up with her PCP in 2 days for a wound check.  She was given Bactrim, Tramadol, and Naprosyn prescriptions.  She was also advised to return to the emergency department for symptoms that change or worsen if unable to schedule an appointment.  ____________________________________________   FINAL CLINICAL IMPRESSION(S) / ED DIAGNOSES  Final diagnoses:  Abscess of finger of left hand    Discharge Medication List as of 08/18/2015  9:33 PM    START taking these medications   Details  naproxen (NAPROSYN) 500 MG tablet Take 1 tablet (500 mg total) by mouth 2 (two) times daily with a meal., Starting 08/18/2015, Until Mon 08/17/16, Print    sulfamethoxazole-trimethoprim (BACTRIM DS,SEPTRA DS) 800-160 MG tablet Take 1 tablet by mouth 2 (two) times daily., Starting 08/18/2015, Until Discontinued, Print    traMADol (ULTRAM) 50 MG tablet Take 1 tablet (50 mg total) by mouth every 6 (six) hours as needed., Starting 08/18/2015, Until Discontinued, Print         Theresa PesterCari B Terressa Evola, FNP 08/18/15 2321  Gayla DossEryka A Gayle, MD 08/18/15 [phone]

## 2015-08-18 NOTE — ED Notes (Addendum)
Pt c/o of abscess to 2nd digit left hand. Pt reports abscess began 3 days ago; pt reports she had an abscess in the same spot 2 years ago, and had the area lanced. Pt reports the area healed, however she never fully regained sensation in the area of the abscess.  Pt reports she in unable to bend the finger. Pt c/o of pain to area (finger) that radiates up the left arm to the elbow.

## 2015-08-21 LAB — AEROBIC CULTURE W GRAM STAIN (SUPERFICIAL SPECIMEN): Special Requests: NORMAL

## 2015-08-21 LAB — AEROBIC CULTURE  (SUPERFICIAL SPECIMEN): CULTURE: NO GROWTH

## 2015-10-14 ENCOUNTER — Emergency Department
Admission: EM | Admit: 2015-10-14 | Discharge: 2015-10-14 | Disposition: A | Discharge status: Home / Self Care | Payer: Medicaid Other | Attending: Emergency Medicine | Admitting: Emergency Medicine

## 2015-10-14 DIAGNOSIS — R112 Nausea with vomiting, unspecified: Secondary | ICD-10-CM | POA: Diagnosis present

## 2015-10-14 DIAGNOSIS — F17210 Nicotine dependence, cigarettes, uncomplicated: Secondary | ICD-10-CM | POA: Insufficient documentation

## 2015-10-14 DIAGNOSIS — I10 Essential (primary) hypertension: Secondary | ICD-10-CM | POA: Diagnosis not present

## 2015-10-14 DIAGNOSIS — R109 Unspecified abdominal pain: Secondary | ICD-10-CM | POA: Diagnosis not present

## 2015-10-14 DIAGNOSIS — F1290 Cannabis use, unspecified, uncomplicated: Secondary | ICD-10-CM | POA: Insufficient documentation

## 2015-10-14 LAB — COMPREHENSIVE METABOLIC PANEL
ALT: 11 U/L — AB (ref 14–54)
ANION GAP: 9 (ref 5–15)
AST: 20 U/L (ref 15–41)
Albumin: 4.8 g/dL (ref 3.5–5.0)
Alkaline Phosphatase: 68 U/L (ref 38–126)
BUN: 12 mg/dL (ref 6–20)
CHLORIDE: 110 mmol/L (ref 101–111)
CO2: 21 mmol/L — AB (ref 22–32)
CREATININE: 0.59 mg/dL (ref 0.44–1.00)
Calcium: 9.7 mg/dL (ref 8.9–10.3)
Glucose, Bld: 106 mg/dL — ABNORMAL HIGH (ref 65–99)
POTASSIUM: 3.5 mmol/L (ref 3.5–5.1)
SODIUM: 140 mmol/L (ref 135–145)
Total Bilirubin: 0.4 mg/dL (ref 0.3–1.2)
Total Protein: 8.8 g/dL — ABNORMAL HIGH (ref 6.5–8.1)

## 2015-10-14 LAB — URINALYSIS COMPLETE WITH MICROSCOPIC (ARMC ONLY)
BACTERIA UA: NONE SEEN
Bilirubin Urine: NEGATIVE
GLUCOSE, UA: NEGATIVE mg/dL
LEUKOCYTES UA: NEGATIVE
NITRITE: NEGATIVE
PH: 5 (ref 5.0–8.0)
PROTEIN: 30 mg/dL — AB
Specific Gravity, Urine: 1.027 (ref 1.005–1.030)

## 2015-10-14 LAB — LIPASE, BLOOD: LIPASE: 13 U/L (ref 11–51)

## 2015-10-14 MED ORDER — LORAZEPAM 2 MG/ML IJ SOLN
1.0000 mg | Freq: Once | INTRAMUSCULAR | Status: AC
  Administered 2015-10-14: 1 mg via INTRAMUSCULAR
  Filled 2015-10-14: qty 1

## 2015-10-14 MED ORDER — PROMETHAZINE HCL 25 MG/ML IJ SOLN
25.0000 mg | Freq: Once | INTRAMUSCULAR | Status: AC
  Administered 2015-10-14: 25 mg via INTRAMUSCULAR
  Filled 2015-10-14 (×2): qty 1

## 2015-10-14 NOTE — Discharge Instructions (Signed)
Please seek medical attention for any high fevers, chest pain, shortness of breath, change in behavior, persistent vomiting, bloody stool or any other new or concerning symptoms.  

## 2015-10-14 NOTE — ED Notes (Signed)
Pt reports that she is no longer vomiting - she states she is sleeping but when she awakes she has nausea - pt does feel like she is ready to go home at this point - Dr Derrill KayGoodman notified

## 2015-10-14 NOTE — ED Notes (Addendum)
One unsuccessful IV attempt by this Clinical research associatewriter. Dr Derrill KayGoodman aware. Patient states she usually has to have IVs in the neck or feet.

## 2015-10-14 NOTE — ED Provider Notes (Signed)
[facility] Emergency Department Provider Note    ____________________________________________   I have reviewed the triage vital signs and the nursing notes.   HISTORY  Chief Complaint Emesis   History limited by: Not Limited   HPI [name] is a 35 y.o. female with history of cyclic vomiting syndrome who presents to the emergency department today because of concern for nausea and vomiting. She says that it started 3 mornings ago. It is triggered by her menstrual cycle. She has been trying the medications prescribed by her GI doctor without any great relief. States last night she had persistent vomiting. She felt hot although did not have any measured fevers.   Past Medical History:  Diagnosis Date  . Cyclic vomiting syndrome   . Hypertension     There are no active problems to display for this patient.   Past Surgical History:  Procedure Laterality Date  . CHOLECYSTECTOMY      Prior to Admission medications   Medication Sig Start Date End Date Taking? Authorizing Provider  naproxen (NAPROSYN) 500 MG tablet Take 1 tablet (500 mg total) by mouth 2 (two) times daily with a meal. 08/18/15 08/17/16  Chinita Pesterari B Triplett, FNP  promethazine (PHENERGAN) 12.5 MG suppository Place 1 suppository (12.5 mg total) rectally every 6 (six) hours as needed for nausea or vomiting. 11/18/14   Jennye MoccasinBrian S Quigley, MD  sulfamethoxazole-trimethoprim (BACTRIM DS,SEPTRA DS) 800-160 MG tablet Take 1 tablet by mouth 2 (two) times daily. 08/18/15   Chinita Pesterari B Triplett, FNP  traMADol (ULTRAM) 50 MG tablet Take 1 tablet (50 mg total) by mouth every 6 (six) hours as needed. 08/18/15   Chinita Pesterari B Triplett, FNP    Allergies Review of patient's allergies indicates no known allergies.  No family history on file.  Social History Social History  Substance Use Topics  . Smoking status: Current Every Day Smoker    Packs/day: 0.30    Types: Cigarettes  . Smokeless tobacco: Never Used  .  Alcohol use No    Review of Systems  Constitutional: Negative for fever. Cardiovascular: Negative for chest pain. Respiratory: Negative for shortness of breath. Gastrointestinal: positive for abdominal pain nausea and vomiting Genitourinary: Negative for dysuria. Musculoskeletal: Negative for back pain. Skin: Negative for rash. Neurological: Negative for headaches, focal weakness or numbness.  10-point ROS otherwise negative.  ____________________________________________   PHYSICAL EXAM:  VITAL SIGNS: ED Triage Vitals [10/14/15 1423]  Enc Vitals Group     BP (!) 155/109     Pulse Rate (!) 108     Resp 20     Temp 98.7 F (37.1 C)     Temp Source Oral     SpO2 100 %     Weight 133 lb (60.3 kg)     Height 5\' 3"  (1.6 m)     Head Circumference      Peak Flow      Pain Score 10   Constitutional: Alert and oriented. Well appearing and in no distress. Eyes: Conjunctivae are normal. PERRL. Normal extraocular movements. ENT   Head: Normocephalic and atraumatic.   Nose: No congestion/rhinnorhea.   Mouth/Throat: Mucous membranes are moist.   Neck: No stridor. Hematological/Lymphatic/Immunilogical: No cervical lymphadenopathy. Cardiovascular: Normal rate, regular rhythm.  No murmurs, rubs, or gallops. Respiratory: Normal respiratory effort without tachypnea nor retractions. Breath sounds are clear and equal bilaterally. No wheezes/rales/rhonchi. Gastrointestinal: Soft and nontender. No distention. There is no CVA tenderness. Genitourinary: Deferred Musculoskeletal: Normal range of motion in all extremities.  No joint effusions.  No lower extremity tenderness nor edema. Neurologic:  Normal speech and language. No gross focal neurologic deficits are appreciated.  Skin:  Skin is warm, dry and intact. No rash noted. Psychiatric: Mood and affect are normal. Speech and behavior are normal. Patient exhibits appropriate insight and  judgment.  ____________________________________________    LABS (pertinent positives/negatives)  Labs Reviewed  COMPREHENSIVE METABOLIC PANEL - Abnormal; Notable for the following:       Result Value   CO2 21 (*)    Glucose, Bld 106 (*)    Total Protein 8.8 (*)    ALT 11 (*)    All other components within normal limits  URINALYSIS COMPLETEWITH MICROSCOPIC ([facility] ONLY) - Abnormal; Notable for the following:    Color, Urine YELLOW (*)    APPearance CLEAR (*)    Ketones, ur TRACE (*)    Hgb urine dipstick 1+ (*)    Protein, ur 30 (*)    Squamous Epithelial / LPF 0-5 (*)    All other components within normal limits  LIPASE, BLOOD  CBC     ____________________________________________   EKG  None  ____________________________________________    RADIOLOGY  None  ____________________________________________   PROCEDURES  Procedures  ____________________________________________   INITIAL IMPRESSION / ASSESSMENT AND PLAN / ED COURSE  Pertinent labs & imaging results that were available during my care of the patient were reviewed by me and considered in my medical decision making (see chart for details).  Patient here with cyclic vomiting syndrome. Will give medications.  Clinical Course   Patient states she feels better after medication. She states she feels comfortable to go home at this point. Will outpatient follow-up with primary care and GI doctors. ____________________________________________   FINAL CLINICAL IMPRESSION(S) / ED DIAGNOSES  Final diagnoses:  Nausea and vomiting, vomiting of unspecified type     Note: This dictation was prepared with Dragon dictation. Any transcriptional errors that result from this process are unintentional    Phineas SemenGraydon Daytona Hedman, MD 10/14/15 1824

## 2015-10-14 NOTE — ED Triage Notes (Signed)
Pt states she has a vomiting syndrome and has been vomiting all day, states she took maxalt and xanax for rescue meds without any relief.. Now pt c/o dizziness.

## 2016-08-04 ENCOUNTER — Emergency Department
Admission: EM | Admit: 2016-08-04 | Discharge: 2016-08-04 | Disposition: A | Discharge status: Home / Self Care | Payer: Medicaid Other | Attending: Emergency Medicine | Admitting: Emergency Medicine

## 2016-08-04 ENCOUNTER — Encounter: Payer: Self-pay | Admitting: *Deleted

## 2016-08-04 DIAGNOSIS — G43A0 Cyclical vomiting, not intractable: Secondary | ICD-10-CM | POA: Insufficient documentation

## 2016-08-04 DIAGNOSIS — Z79899 Other long term (current) drug therapy: Secondary | ICD-10-CM | POA: Insufficient documentation

## 2016-08-04 DIAGNOSIS — F17210 Nicotine dependence, cigarettes, uncomplicated: Secondary | ICD-10-CM | POA: Insufficient documentation

## 2016-08-04 DIAGNOSIS — I10 Essential (primary) hypertension: Secondary | ICD-10-CM | POA: Insufficient documentation

## 2016-08-04 DIAGNOSIS — R1115 Cyclical vomiting syndrome unrelated to migraine: Secondary | ICD-10-CM

## 2016-08-04 DIAGNOSIS — R112 Nausea with vomiting, unspecified: Secondary | ICD-10-CM | POA: Diagnosis present

## 2016-08-04 LAB — URINALYSIS, COMPLETE (UACMP) WITH MICROSCOPIC
BACTERIA UA: NONE SEEN
BILIRUBIN URINE: NEGATIVE
GLUCOSE, UA: NEGATIVE mg/dL
HGB URINE DIPSTICK: NEGATIVE
KETONES UR: 20 mg/dL — AB
Leukocytes, UA: NEGATIVE
NITRITE: NEGATIVE
PROTEIN: 30 mg/dL — AB
Specific Gravity, Urine: 1.028 (ref 1.005–1.030)
pH: 6 (ref 5.0–8.0)

## 2016-08-04 LAB — COMPREHENSIVE METABOLIC PANEL
ALBUMIN: 4.6 g/dL (ref 3.5–5.0)
ALT: 16 U/L (ref 14–54)
ANION GAP: 9 (ref 5–15)
AST: 22 U/L (ref 15–41)
Alkaline Phosphatase: 65 U/L (ref 38–126)
BILIRUBIN TOTAL: 0.8 mg/dL (ref 0.3–1.2)
BUN: 15 mg/dL (ref 6–20)
CO2: 25 mmol/L (ref 22–32)
Calcium: 9.8 mg/dL (ref 8.9–10.3)
Chloride: 108 mmol/L (ref 101–111)
Creatinine, Ser: 0.67 mg/dL (ref 0.44–1.00)
GFR calc non Af Amer: >60 mL/min (ref >60)
GLUCOSE: 99 mg/dL (ref 65–99)
POTASSIUM: 3.2 mmol/L — AB (ref 3.5–5.1)
SODIUM: 142 mmol/L (ref 135–145)
TOTAL PROTEIN: 7.9 g/dL (ref 6.5–8.1)

## 2016-08-04 LAB — CBC
HEMATOCRIT: 40.9 % (ref 35.0–47.0)
HEMOGLOBIN: 13.7 g/dL (ref 12.0–16.0)
MCH: 31.8 pg (ref 26.0–34.0)
MCHC: 33.6 g/dL (ref 32.0–36.0)
MCV: 94.7 fL (ref 80.0–100.0)
Platelets: 295 10*3/uL (ref 150–440)
RBC: 4.32 MIL/uL (ref 3.80–5.20)
RDW: 15.1 % — ABNORMAL HIGH (ref 11.5–14.5)
WBC: 18.5 10*3/uL — ABNORMAL HIGH (ref 3.6–11.0)

## 2016-08-04 LAB — LIPASE, BLOOD: Lipase: 22 U/L (ref 11–51)

## 2016-08-04 LAB — POCT PREGNANCY, URINE: PREG TEST UR: NEGATIVE

## 2016-08-04 MED ORDER — HALOPERIDOL LACTATE 5 MG/ML IJ SOLN
2.5000 mg | Freq: Once | INTRAMUSCULAR | Status: AC
  Administered 2016-08-04: 2.5 mg via INTRAVENOUS
  Filled 2016-08-04: qty 1

## 2016-08-04 MED ORDER — DIPHENHYDRAMINE HCL 50 MG/ML IJ SOLN
12.5000 mg | Freq: Once | INTRAMUSCULAR | Status: AC
  Administered 2016-08-04: 12.5 mg via INTRAVENOUS
  Filled 2016-08-04: qty 1

## 2016-08-04 MED ORDER — SODIUM CHLORIDE 0.9 % IV BOLUS (SEPSIS)
1000.0000 mL | INTRAVENOUS | Status: AC
  Administered 2016-08-04: 1000 mL via INTRAVENOUS

## 2016-08-04 NOTE — ED Notes (Signed)
Pt discharged to home.  Family member driving.  Discharge instructions reviewed.  Verbalized understanding.  No questions or concerns at this time.  Teach back verified.  Pt in NAD.  No items left in ED.   

## 2016-08-04 NOTE — ED Notes (Signed)
Pt reports been vomiting since Sunday last time she has an episode of vomiting around 2pm. Pt denies any diarrhea, reports unable to keep anything in stomach. Pt talks in complete sentences no respiratory distress noted

## 2016-08-04 NOTE — ED Provider Notes (Signed)
[facility] Emergency Department Provider Note  ____________________________________________   First MD Initiated Contact with Patient 08/04/16 2020     (approximate)  I have reviewed the triage vital signs and the nursing notes.   HISTORY  Chief Complaint Abdominal Pain and Emesis    HPI [name] is a 36 y.o. female who reports a history of cyclic vomiting syndrome and occasional marijuana use (no more than a couple of times per week) who presents for evaluation of persistent vomiting over the last 4 days.  She has some mild epigastric pain accompanying the vomiting but most of the issue is the persistent nausea and inability to tolerate anything by mouth.  She has a number of medications that she takes as prescribed by [facility] that is supposed to abort the cyclic vomiting process, but it has not worked this time.  She reports that she does get some relief by getting in a tub of hot water but that it does not last.  Nothing in particular makes the patient's symptoms better nor worse.  She came in to the emergency department today because the symptoms are not improving and she is concerned about not being able to eat or drink anything for several days.  She feels very hungry but cannot even tolerate water by mouth. she denies fever/chills, chest pain, shortness of breath, lower abdominal pain, dysuria.   Past Medical History:  Diagnosis Date  . Cyclic vomiting syndrome   . Hypertension     There are no active problems to display for this patient.   Past Surgical History:  Procedure Laterality Date  . CHOLECYSTECTOMY      Prior to Admission medications   Medication Sig Start Date End Date Taking? Authorizing Provider  naproxen (NAPROSYN) 500 MG tablet Take 1 tablet (500 mg total) by mouth 2 (two) times daily with a meal. 08/18/15 08/17/16  Triplett, Cari B, FNP  promethazine (PHENERGAN) 12.5 MG suppository Place 1 suppository (12.5 mg  total) rectally every 6 (six) hours as needed for nausea or vomiting. 11/18/14   Jennye MoccasinQuigley, Brian S, MD  sulfamethoxazole-trimethoprim (BACTRIM DS,SEPTRA DS) 800-160 MG tablet Take 1 tablet by mouth 2 (two) times daily. 08/18/15   Triplett, Rulon Eisenmengerari B, FNP  traMADol (ULTRAM) 50 MG tablet Take 1 tablet (50 mg total) by mouth every 6 (six) hours as needed. 08/18/15   Chinita Pesterriplett, Cari B, FNP    Allergies Patient has no known allergies.  No family history on file.  Social History Social History  Substance Use Topics  . Smoking status: Current Every Day Smoker    Packs/day: 0.30    Types: Cigarettes  . Smokeless tobacco: Never Used  . Alcohol use No    Review of Systems Constitutional: No fever/chills Eyes: No visual changes. ENT: No sore throat. Cardiovascular: Denies chest pain. Respiratory: Denies shortness of breath. Gastrointestinal: Persistent severe vomiting for 4 days with some mild epigastric tenderness Genitourinary: Negative for dysuria. Musculoskeletal: Negative for neck pain.  Negative for back pain. Integumentary: Negative for rash. Neurological: Negative for headaches, focal weakness or numbness.   ____________________________________________   PHYSICAL EXAM:  VITAL SIGNS: ED Triage Vitals  Enc Vitals Group     BP 08/04/16 1700 (!) 159/108     Pulse Rate 08/04/16 1700 (!) 114     Resp 08/04/16 1700 20     Temp 08/04/16 1700 98.6 F (37 C)     Temp Source 08/04/16 1700 Oral     SpO2  08/04/16 1700 99 %     Weight 08/04/16 1701 68 kg (150 lb)     Height 08/04/16 1701 1.6 m (5\' 3" )     Head Circumference --      Peak Flow --      Pain Score 08/04/16 1700 8     Pain Loc --      Pain Edu? --      Excl. in GC? --     Constitutional: Alert and oriented. Well appearing and in no acute distress. Eyes: Conjunctivae are normal.  Head: Atraumatic. Nose: No congestion/rhinnorhea. Mouth/Throat: Mucous membranes are Dry Neck: No stridor.  No meningeal signs.     Cardiovascular: Tachycardia, regular rhythm. Good peripheral circulation. Grossly normal heart sounds. Respiratory: Normal respiratory effort.  No retractions. Lungs CTAB. Gastrointestinal: Soft and nontender. No distention.  Musculoskeletal: No lower extremity tenderness nor edema. No gross deformities of extremities. Neurologic:  Normal speech and language. No gross focal neurologic deficits are appreciated.  Skin:  Skin is warm, dry and intact. No rash noted. Psychiatric: Mood and affect are normal. Speech and behavior are normal.  ____________________________________________   LABS (all labs ordered are listed, but only abnormal results are displayed)  Labs Reviewed  COMPREHENSIVE METABOLIC PANEL - Abnormal; Notable for the following:       Result Value   Potassium 3.2 (*)    All other components within normal limits  CBC - Abnormal; Notable for the following:    WBC 18.5 (*)    RDW 15.1 (*)    All other components within normal limits  URINALYSIS, COMPLETE (UACMP) WITH MICROSCOPIC - Abnormal; Notable for the following:    Color, Urine YELLOW (*)    APPearance HAZY (*)    Ketones, ur 20 (*)    Protein, ur 30 (*)    Squamous Epithelial / LPF 0-5 (*)    All other components within normal limits  LIPASE, BLOOD  POC URINE PREG, ED  POCT PREGNANCY, URINE   ____________________________________________  EKG  None - EKG not ordered by ED physician ____________________________________________  RADIOLOGY   No results found.  ____________________________________________   PROCEDURES  Critical Care performed: No   Procedure(s) performed:   Procedures   ____________________________________________   INITIAL IMPRESSION / ASSESSMENT AND PLAN / ED COURSE  Pertinent labs & imaging results that were available during my care of the patient were reviewed by me and considered in my medical decision making (see chart for details).  The patient has no tenderness to  palpation of her abdomen which is reassuring.  She has a leukocytosis of about 18 but her labs are otherwise unremarkable except for some mild hypokalemia which is to be expected from her vomiting.  She does have ketones in her urine I believe she is volume depleted based on her tachycardia and ketonuria and I will provide a liter of fluids along with Haldol 2.5 mg IV and Benadryl 12.5 mg IV in an attempt to abort her cyclic vomiting.  There is no indication for any imaging at this time.  The patient agrees with the current plan.  Clinical Course as of Aug 05 2234  Tue Aug 04, 2016  2233 The patient feels much better, tolerated ginger ale, and wants to go.    I gave my usual and customary return precautions.     [CF]    Clinical Course User Index [CF] Loleta Rose, MD    ____________________________________________  FINAL CLINICAL IMPRESSION(S) / ED DIAGNOSES  Final diagnoses:  Non-intractable cyclical vomiting with nausea     MEDICATIONS GIVEN DURING THIS VISIT:  Medications  sodium chloride 0.9 % bolus 1,000 mL (1,000 mLs Intravenous New Bag/Given 08/04/16 2159)  haloperidol lactate (HALDOL) injection 2.5 mg (2.5 mg Intravenous Given 08/04/16 2200)  diphenhydrAMINE (BENADRYL) injection 12.5 mg (12.5 mg Intravenous Given 08/04/16 2203)     NEW OUTPATIENT MEDICATIONS STARTED DURING THIS VISIT:  New Prescriptions   No medications on file    Modified Medications   No medications on file    Discontinued Medications   No medications on file     Note:  This document was prepared using Dragon voice recognition software and may include unintentional dictation errors.    Loleta Rose, MD 08/04/16 2236

## 2016-08-04 NOTE — ED Triage Notes (Signed)
Pt has abd pain with vomiting for 4 days  Pt taking rx meds without relief.  No diarrhea.  Pt alert.  Speech clear.

## 2016-08-04 NOTE — Discharge Instructions (Signed)
Your workup in the Emergency Department today was reassuring.  We did not find any specific abnormalities.  We recommend you drink plenty of fluids, take your regular medications and/or any new ones prescribed today, and follow up with the doctor(s) listed in these documents as recommended.  Return to the Emergency Department if you develop new or worsening symptoms that concern you.  

## 2016-08-04 NOTE — ED Notes (Signed)
Pt reports history of HTN has not been able to take her meds due to nausea.

## 2016-08-04 NOTE — ED Notes (Signed)
Pt refusing finishing IV fluids, states that nausea is better.  Pt tolerated ginger ale.  EDP notified and talking with pt at this time.

## 2016-08-04 NOTE — ED Notes (Signed)
2 unsuccessful PIV attempts by this RN: LEFT AC and RIGHT forearm.

## 2016-09-18 ENCOUNTER — Encounter: Payer: Self-pay | Admitting: Emergency Medicine

## 2016-09-18 ENCOUNTER — Emergency Department
Admission: EM | Admit: 2016-09-18 | Discharge: 2016-09-18 | Disposition: A | Discharge status: Home / Self Care | Payer: Medicaid Other | Attending: Emergency Medicine | Admitting: Emergency Medicine

## 2016-09-18 DIAGNOSIS — Z79899 Other long term (current) drug therapy: Secondary | ICD-10-CM | POA: Insufficient documentation

## 2016-09-18 DIAGNOSIS — F17210 Nicotine dependence, cigarettes, uncomplicated: Secondary | ICD-10-CM | POA: Insufficient documentation

## 2016-09-18 DIAGNOSIS — I10 Essential (primary) hypertension: Secondary | ICD-10-CM | POA: Diagnosis not present

## 2016-09-18 DIAGNOSIS — G43A0 Cyclical vomiting, not intractable: Secondary | ICD-10-CM | POA: Diagnosis not present

## 2016-09-18 DIAGNOSIS — R1115 Cyclical vomiting syndrome unrelated to migraine: Secondary | ICD-10-CM

## 2016-09-18 DIAGNOSIS — R1110 Vomiting, unspecified: Secondary | ICD-10-CM | POA: Diagnosis present

## 2016-09-18 LAB — CBC
HEMATOCRIT: 45.7 % (ref 35.0–47.0)
Hemoglobin: 15 g/dL (ref 12.0–16.0)
MCH: 31.6 pg (ref 26.0–34.0)
MCHC: 32.8 g/dL (ref 32.0–36.0)
MCV: 96.1 fL (ref 80.0–100.0)
Platelets: 276 10*3/uL (ref 150–440)
RBC: 4.75 MIL/uL (ref 3.80–5.20)
RDW: 14.7 % — ABNORMAL HIGH (ref 11.5–14.5)
WBC: 9.9 10*3/uL (ref 3.6–11.0)

## 2016-09-18 LAB — COMPREHENSIVE METABOLIC PANEL
ALBUMIN: 5.1 g/dL — AB (ref 3.5–5.0)
ALK PHOS: 73 U/L (ref 38–126)
ALT: 10 U/L — ABNORMAL LOW (ref 14–54)
AST: 24 U/L (ref 15–41)
Anion gap: 8 (ref 5–15)
BILIRUBIN TOTAL: 0.5 mg/dL (ref 0.3–1.2)
BUN: 16 mg/dL (ref 6–20)
CALCIUM: 9.7 mg/dL (ref 8.9–10.3)
CO2: 22 mmol/L (ref 22–32)
Chloride: 113 mmol/L — ABNORMAL HIGH (ref 101–111)
Creatinine, Ser: 0.82 mg/dL (ref 0.44–1.00)
GFR calc Af Amer: >60 mL/min (ref >60)
GFR calc non Af Amer: >60 mL/min (ref >60)
GLUCOSE: 105 mg/dL — AB (ref 65–99)
POTASSIUM: 4.6 mmol/L (ref 3.5–5.1)
Sodium: 143 mmol/L (ref 135–145)
TOTAL PROTEIN: 8.7 g/dL — AB (ref 6.5–8.1)

## 2016-09-18 LAB — URINALYSIS, COMPLETE (UACMP) WITH MICROSCOPIC
Bilirubin Urine: NEGATIVE
Glucose, UA: NEGATIVE mg/dL
HGB URINE DIPSTICK: NEGATIVE
Ketones, ur: NEGATIVE mg/dL
Leukocytes, UA: NEGATIVE
NITRITE: NEGATIVE
Protein, ur: 30 mg/dL — AB
SPECIFIC GRAVITY, URINE: 1.028 (ref 1.005–1.030)
pH: 6 (ref 5.0–8.0)

## 2016-09-18 LAB — POCT PREGNANCY, URINE: PREG TEST UR: NEGATIVE

## 2016-09-18 LAB — LIPASE, BLOOD: LIPASE: 25 U/L (ref 11–51)

## 2016-09-18 MED ORDER — HALOPERIDOL LACTATE 5 MG/ML IJ SOLN
5.0000 mg | Freq: Once | INTRAMUSCULAR | Status: AC
  Administered 2016-09-18: 5 mg via INTRAVENOUS
  Filled 2016-09-18: qty 1

## 2016-09-18 MED ORDER — ONDANSETRON 4 MG PO TBDP
4.0000 mg | ORAL_TABLET | Freq: Once | ORAL | Status: AC | PRN
  Administered 2016-09-18: 4 mg via ORAL
  Filled 2016-09-18: qty 1

## 2016-09-18 MED ORDER — DIPHENHYDRAMINE HCL 50 MG/ML IJ SOLN
25.0000 mg | Freq: Once | INTRAMUSCULAR | Status: AC
  Administered 2016-09-18: 25 mg via INTRAVENOUS
  Filled 2016-09-18: qty 1

## 2016-09-18 MED ORDER — SODIUM CHLORIDE 0.9 % IV BOLUS (SEPSIS)
1000.0000 mL | Freq: Once | INTRAVENOUS | Status: AC
  Administered 2016-09-18: 1000 mL via INTRAVENOUS

## 2016-09-18 MED ORDER — METOCLOPRAMIDE HCL 5 MG/ML IJ SOLN
10.0000 mg | Freq: Once | INTRAMUSCULAR | Status: AC
  Administered 2016-09-18: 10 mg via INTRAVENOUS
  Filled 2016-09-18: qty 2

## 2016-09-18 MED ORDER — ONDANSETRON 4 MG PO TBDP: 4.0000 mg | ORAL_TABLET | Freq: Three times a day (TID) | ORAL | 0 refills | Status: DC | PRN

## 2016-09-18 NOTE — ED Provider Notes (Addendum)
[facility] Emergency Department Provider Note  ____________________________________________  Time seen: Approximately 7:19 PM  I have reviewed the triage vital signs and the nursing notes.   HISTORY  Chief Complaint Emesis    HPI Theresa Carpenter is a 36 y.o. female reports that she woke up this morning with nausea and vomiting. Unable to tolerate any oral intake all day. Left upper quadrant abdominal pain. Was in her usual state of health at bedtime last night and eating and drinking normally. Has a history of cyclic vomiting syndrome and this feels just like it. She reports that she has "a lot of triggers", but is unable to identify if she was exposed to any of those today or last night. Denies fever chills or sweats. No chest pain shortness of breath. No dizziness or syncope. No dysuria frequency urgency or abnormal vaginal bleeding or discharge.     Past Medical History:  Diagnosis Date  . Cyclic vomiting syndrome   . Hypertension      There are no active problems to display for this patient.    Past Surgical History:  Procedure Laterality Date  . CHOLECYSTECTOMY       Prior to Admission medications   Medication Sig Start Date End Date Taking? Authorizing Provider  ALPRAZolam Prudy Feeler(XANAX) 0.5 MG tablet Take 1-2 mg by mouth at bedtime as needed. 09/14/16  Yes [provider]  hydrOXYzine (ATARAX/VISTARIL) 25 MG tablet Take 25 mg by mouth 3 (three) times daily. 09/14/16  Yes [provider]  metoprolol succinate (TOPROL-XL) 25 MG 24 hr tablet Take 25 mg by mouth daily. 09/15/16  Yes [provider]  promethazine (PHENERGAN) 25 MG tablet Take 25 mg by mouth every 6 (six) hours as needed for nausea. 07/30/16  Yes [provider]  topiramate (TOPAMAX) 25 MG tablet Take 25 mg by mouth 2 (two) times daily. 09/14/16  Yes [provider]  ondansetron (ZOFRAN ODT) 4 MG disintegrating tablet Take 1 tablet (4 mg total) by  mouth every 8 (eight) hours as needed for nausea or vomiting. 09/18/16   Sharman CheekStafford, Eluzer Howdeshell, MD  promethazine (PHENERGAN) 12.5 MG suppository Place 1 suppository (12.5 mg total) rectally every 6 (six) hours as needed for nausea or vomiting. Patient not taking: Reported on 09/18/2016 11/18/14   Jennye MoccasinQuigley, Brian S, MD  traMADol (ULTRAM) 50 MG tablet Take 1 tablet (50 mg total) by mouth every 6 (six) hours as needed. Patient not taking: Reported on 09/18/2016 08/18/15   Chinita Pesterriplett, Cari B, FNP     Allergies Patient has no known allergies.   No family history on file.  Social History Social History  Substance Use Topics  . Smoking status: Current Every Day Smoker    Packs/day: 0.30    Types: Cigarettes  . Smokeless tobacco: Never Used  . Alcohol use No    Review of Systems  Constitutional:   No fever or chills.  ENT:   No sore throat. No rhinorrhea. Cardiovascular:   No chest pain or syncope. Respiratory:   No dyspnea or cough. Gastrointestinal:   Negative for abdominal pain, Positive vomiting. No constipation.  Musculoskeletal:   Negative for focal pain or swelling All other systems reviewed and are negative except as documented above in ROS and HPI.  ____________________________________________   PHYSICAL EXAM:  VITAL SIGNS: ED Triage Vitals  Enc Vitals Group     BP 09/18/16 1509 (!) 159/114     Pulse Rate 09/18/16 1509 (!) 124     Resp 09/18/16  1509 14     Temp 09/18/16 1509 99.9 F (37.7 C)     Temp Source 09/18/16 1509 Oral     SpO2 09/18/16 1509 99 %     Weight 09/18/16 1509 150 lb (68 kg)     Height 09/18/16 1509 5\' 3"  (1.6 m)     Head Circumference --      Peak Flow --      Pain Score 09/18/16 1517 10     Pain Loc --      Pain Edu? --      Excl. in GC? --     Vital signs reviewed, nursing assessments reviewed.   Constitutional:   Alert and oriented. Well appearing and in no distress. Eyes:   No scleral icterus.  EOMI. No nystagmus. No conjunctival pallor.  PERRL. ENT   Head:   Normocephalic and atraumatic.   Nose:   No congestion/rhinnorhea.    Mouth/Throat:   MMM, no pharyngeal erythema. No peritonsillar mass.    Neck:   No meningismus. Full ROM Hematological/Lymphatic/Immunilogical:   No cervical lymphadenopathy. Cardiovascular:   RRR. Symmetric bilateral radial and DP pulses.  No murmurs.  Respiratory:   Normal respiratory effort without tachypnea/retractions. Breath sounds are clear and equal bilaterally. No wheezes/rales/rhonchi. Gastrointestinal:   Soft and nontender. Non distended. There is no CVA tenderness.  No rebound, rigidity, or guarding. Genitourinary:   deferred Musculoskeletal:   Normal range of motion in all extremities. No joint effusions.  No lower extremity tenderness.  No edema. Neurologic:   Normal speech and language.  Motor grossly intact. No gross focal neurologic deficits are appreciated.  Skin:    Skin is warm, dry and intact. No rash noted.  No petechiae, purpura, or bullae.  ____________________________________________    LABS (pertinent positives/negatives) (all labs ordered are listed, but only abnormal results are displayed) Labs Reviewed  COMPREHENSIVE METABOLIC PANEL - Abnormal; Notable for the following:       Result Value   Chloride 113 (*)    Glucose, Bld 105 (*)    Total Protein 8.7 (*)    Albumin 5.1 (*)    ALT 10 (*)    All other components within normal limits  CBC - Abnormal; Notable for the following:    RDW 14.7 (*)    All other components within normal limits  URINALYSIS, COMPLETE (UACMP) WITH MICROSCOPIC - Abnormal; Notable for the following:    Color, Urine YELLOW (*)    APPearance CLEAR (*)    Protein, ur 30 (*)    Bacteria, UA RARE (*)    Squamous Epithelial / LPF 0-5 (*)    All other components within normal limits  LIPASE, BLOOD  POCT PREGNANCY, URINE   ____________________________________________   EKG    ____________________________________________     RADIOLOGY  No results found.  ____________________________________________   PROCEDURES Procedures  ____________________________________________   INITIAL IMPRESSION / ASSESSMENT AND PLAN / ED COURSE  Pertinent labs & imaging results that were available during my care of the patient were reviewed by me and considered in my medical decision making (see chart for details).  Patient presents with nausea and vomiting. Exam is unremarkable other than heart rate of 120 at triage, heart rate improved to 90 on my exam. Patient's calm and comfortable not in distress. Labs unremarkable. We'll treat symptomatically with IV fluids and antiemetics, by mouth trial hopefully discharge home.  Clinical Course as of Sep 19 2006  Fri Sep 18, 2016  1939 Still nauseated. Will  give haldol for antiemetic effect  [PS]    Clinical Course User Index [PS] Sharman Cheek, MD    ----------------------------------------- 8:27 PM on 09/18/2016 -----------------------------------------  Heart rate 90. Abdominal exam still benign and completely nontender. Patient feels better started to drink water and eat crackers, ready to go home.   ____________________________________________   FINAL CLINICAL IMPRESSION(S) / ED DIAGNOSES  Final diagnoses:  Non-intractable cyclical vomiting with nausea      New Prescriptions   ONDANSETRON (ZOFRAN ODT) 4 MG DISINTEGRATING TABLET    Take 1 tablet (4 mg total) by mouth every 8 (eight) hours as needed for nausea or vomiting.     Portions of this note were generated with dragon dictation software. Dictation errors may occur despite best attempts at proofreading.    Sharman Cheek, MD 09/18/16 Malvin Johns    Sharman Cheek, MD 09/18/16 2028

## 2016-09-18 NOTE — ED Notes (Signed)
MD Stafford at bedside. 

## 2016-09-18 NOTE — ED Triage Notes (Signed)
Vomiting since this am.  Unable to tolerate PO.  Also c/o LUQ abdominal pain.

## 2016-09-18 NOTE — ED Notes (Signed)

## 2016-09-18 NOTE — ED Notes (Signed)
Pt provided with ice water and saltines as a PO challenge.

## 2016-09-18 NOTE — ED Notes (Signed)
Pt assisted to ambulate to the commode and back to stretcher; pt displays a steady, even, balanced gait.

## 2016-09-18 NOTE — ED Notes (Signed)
Unable to obtain E-signature, E-sign pad not working; discharge instructions reviewed with patient and family; patient and family verbalized understanding.

## 2016-09-18 NOTE — Discharge Instructions (Signed)
Results for orders placed or performed during the hospital encounter of 09/18/16  Lipase, blood  Result Value Ref Range   Lipase 25 11 - 51 U/L  Comprehensive metabolic panel  Result Value Ref Range   Sodium 143 135 - 145 mmol/L   Potassium 4.6 3.5 - 5.1 mmol/L   Chloride 113 (H) 101 - 111 mmol/L   CO2 22 22 - 32 mmol/L   Glucose, Bld 105 (H) 65 - 99 mg/dL   BUN 16 6 - 20 mg/dL   Creatinine, Ser 7.820.82 0.44 - 1.00 mg/dL   Calcium 9.7 8.9 - 95.610.3 mg/dL   Total Protein 8.7 (H) 6.5 - 8.1 g/dL   Albumin 5.1 (H) 3.5 - 5.0 g/dL   AST 24 15 - 41 U/L   ALT 10 (L) 14 - 54 U/L   Alkaline Phosphatase 73 38 - 126 U/L   Total Bilirubin 0.5 0.3 - 1.2 mg/dL   GFR calc non Af Amer >60 >60 mL/min   GFR calc Af Amer >60 >60 mL/min   Anion gap 8 5 - 15  CBC  Result Value Ref Range   WBC 9.9 3.6 - 11.0 K/uL   RBC 4.75 3.80 - 5.20 MIL/uL   Hemoglobin 15.0 12.0 - 16.0 g/dL   HCT 21.345.7 08.635.0 - 57.847.0 %   MCV 96.1 80.0 - 100.0 fL   MCH 31.6 26.0 - 34.0 pg   MCHC 32.8 32.0 - 36.0 g/dL   RDW 46.914.7 (H) 62.911.5 - 52.814.5 %   Platelets 276 150 - 440 K/uL  Urinalysis, Complete w Microscopic  Result Value Ref Range   Color, Urine YELLOW (A) YELLOW   APPearance CLEAR (A) CLEAR   Specific Gravity, Urine 1.028 1.005 - 1.030   pH 6.0 5.0 - 8.0   Glucose, UA NEGATIVE NEGATIVE mg/dL   Hgb urine dipstick NEGATIVE NEGATIVE   Bilirubin Urine NEGATIVE NEGATIVE   Ketones, ur NEGATIVE NEGATIVE mg/dL   Protein, ur 30 (A) NEGATIVE mg/dL   Nitrite NEGATIVE NEGATIVE   Leukocytes, UA NEGATIVE NEGATIVE   RBC / HPF 0-5 0 - 5 RBC/hpf   WBC, UA 0-5 0 - 5 WBC/hpf   Bacteria, UA RARE (A) NONE SEEN   Squamous Epithelial / LPF 0-5 (A) NONE SEEN   Mucous PRESENT   Pregnancy, urine POC  Result Value Ref Range   Preg Test, Ur NEGATIVE NEGATIVE   No results found.

## 2016-12-03 ENCOUNTER — Other Ambulatory Visit: Payer: Self-pay | Admitting: Gastroenterology

## 2016-12-03 DIAGNOSIS — K6289 Other specified diseases of anus and rectum: Secondary | ICD-10-CM

## 2016-12-03 DIAGNOSIS — N898 Other specified noninflammatory disorders of vagina: Secondary | ICD-10-CM

## 2016-12-04 ENCOUNTER — Telehealth: Payer: Self-pay | Admitting: *Deleted

## 2016-12-07 ENCOUNTER — Ambulatory Visit: Payer: Medicaid Other

## 2016-12-11 ENCOUNTER — Ambulatory Visit: Payer: Medicaid Other

## 2017-01-12 ENCOUNTER — Ambulatory Visit: Payer: Self-pay | Admitting: General Surgery

## 2017-01-12 NOTE — H&P (View-Only) (Signed)
Theresa Carpenter is a 36 y.o. female who presents to the Clinic for consultation at the request of Dr. Alice Reichert for evaluation of suspected perianal abscess.  PCP:  Tomasa Hose Achirimofor, MD  HISTORY OF PRESENT ILLNESS: Ms. Kappes comes to the clinic with the complain of a mass on the perianal area. The patient refers that starting on August she felt the mass and at that moment she saw blood and pus. The mass was associated with acute sharp pain that did not radiated to other part of the bodies. Patient did not seek medical evaluation at that moment. Patient refers that pain started to improve by itself with time. On October the patient had a colonoscopy due to this complains of rectal bleeding and purulent secretions. Colonoscopy showed the suspected abscess that was not draining. Patient denies any other episode of purulent secretions other than the one in August. GI physician oriented the patient to see a surgeon for treatment of the abscess but it was until today this week that the patient contacted our department. Today, patient complain of episodic pain on the perianal area intercalated with pressure sensation. Patient also refers a palpable hard mass on the anal area. Pain does not radiates. Again, patient refers not seen pus since August. Denies fever or chills. Refers constipation. Refers rectal bleeding 3 times a week.    PROBLEM LIST:         Problem List  Date Reviewed: 12/03/2016         Noted   Vomiting 12/03/2016      GENERAL REVIEW OF SYSTEMS:  General ROS: negative for - chills, fatigue, fever, weight gain or weight loss Allergy and Immunology ROS: negative for - hives  Hematological and Lymphatic ROS: negative for - bleeding problems or bruising, negative for palpable nodes Endocrine ROS: negative for - heat or cold intolerance, hair changes Respiratory ROS: negative for - cough, shortness of breath or wheezing Cardiovascular ROS: no chest pain or palpitations GI ROS:  negative for nausea, vomiting, abdominal pain, diarrhea, constipation Musculoskeletal ROS: negative for - joint swelling or muscle pain Neurological ROS: negative for - confusion, syncope Dermatological ROS: negative for pruritus and rash  MEDICATIONS: CurrentMedications        Current Outpatient Medications  Medication Sig Dispense Refill  . ALPRAZolam (XANAX) 0.5 MG tablet Take 0.5 mg by mouth nightly as needed for Sleep.    . hydrOXYzine (ATARAX) 25 MG tablet Take 25 mg by mouth 3 (three) times daily as needed for Itching.    . metoprolol succinate (TOPROL-XL) 25 MG XL tablet Take 25 mg by mouth once daily.    . polyethylene glycol (MIRALAX) powder AS DIRECTED FOR COLONIC PREP. 255 g 0  . promethazine (PHENERGAN) 25 mg/mL injection Inject into the muscle every 6 (six) hours as needed for Nausea.    Marland Kitchen topiramate (TOPAMAX) 25 MG tablet Take 25 mg by mouth 2 (two) times daily.     No current facility-administered medications for this visit.       ALLERGIES: Patient has no known allergies.  PAST MEDICAL HISTORY:     Past Medical History:  Diagnosis Date  . Cyclical vomiting syndrome   . HTN (hypertension)     PAST SURGICAL HISTORY:      Past Surgical History:  Procedure Laterality Date  . CHOLECYSTECTOMY    . COLONOSCOPY  12/21/2016   Hyperplastic colon polyp/Repeat 6yr at age 36/TKT    FAMILY HISTORY:      Family History  Problem  Relation Age of Onset  . Colon polyps Father   . Colon cancer Neg Hx   . Rectal cancer Neg Hx   . Crohn's disease Neg Hx   . Ulcerative colitis Neg Hx      SOCIAL HISTORY: Social History        Socioeconomic History  . Marital status: Single    Spouse name: None  . Number of children: None  . Years of education: None  . Highest education level: None  Social Needs  . Financial resource strain: None  . Food insecurity - worry: None  . Food insecurity - inability: None  . Transportation needs  - medical: None  . Transportation needs - non-medical: None  Occupational History  . None  Tobacco Use  . Smoking status: Current Every Day Smoker  . Smokeless tobacco: Never Used  Substance and Sexual Activity  . Alcohol use: Never    Frequency: Never  . Drug use: Never  . Sexual activity: Defer  Other Topics Concern  . None  Social History Narrative  . None    PHYSICAL EXAM:    Vitals:   01/11/17 0853  BP: 104/75  Pulse: 94  Temp: 36.9 C (98.4 F)   Body mass index is 26.5 kg/m. Weight: 67.9 kg (149 lb 9.6 oz)   General Appearance:    Alert, cooperative, no distress, appears stated age  Head:     Atraumatic, normocephalic  Eyes:   Anciteric, no erythema, no secretions  Neck:   Supple, symmetrical, no JVD, no palpable lymph nodes  Mouth:   Lips, mucosa, and tongue normal;   Lungs:     Clear to auscultation bilaterally, respirations unlabored   Heart:    Regular rate and rhythm, S1 and S2 normal, no murmur, rub   or gallop  Abdomen:  Rectum:     Soft, non-tender, bowel sounds active all four quadrants,    no masses, no organomegaly   Palpable external mass consistent wit chronic thrombosed external hemorrhoid. Anterior anal tag. No palpable fluctuant mass. Adequate sphincter tone.   Extremities:   Extremities normal, atraumatic, no cyanosis or edema  Skin:   Skin color, texture, turgor normal, no rashes or lesions   Neurologic:   Grossly intact.    REVIEW OF DATA: I have reviewed the following data today:      Office Visit on 12/03/2016  Component Date Value  . WBC (White Blood Cell Co* 12/03/2016 8.9   . RBC (Red Blood Cell Coun* 12/03/2016 4.03*  . Hemoglobin 12/03/2016 12.9   . Hematocrit 12/03/2016 39.5   . MCV (Mean Corpuscular Vo* 12/03/2016 98.0   . MCH (Mean Corpuscular He* 12/03/2016 32.0*  . MCHC (Mean Corpuscular H* 12/03/2016 32.7   . Platelet Count 12/03/2016 298   . RDW-CV (Red Cell Distrib* 12/03/2016 15.7*  . MPV (Mean Platelet  Volum* 12/03/2016 11.1   . Neutrophils 12/03/2016 5.68   . Lymphocytes 12/03/2016 2.34   . Monocytes 12/03/2016 0.51   . Eosinophils 12/03/2016 0.32   . Basophils 12/03/2016 0.03   . Neutrophil % 12/03/2016 63.8   . Lymphocyte % 12/03/2016 26.3   . Monocyte % 12/03/2016 5.7   . Eosinophil % 12/03/2016 3.6   . Basophil% 12/03/2016 0.3   . Immature Granulocyte % 12/03/2016 0.3   . Immature Granulocyte Cou* 12/03/2016 0.03   . C Reactive Protein - Lab* 12/03/2016 3.1   . Glucose 12/03/2016 80   . Sodium 12/03/2016 139   . Potassium 12/03/2016  4.9   . Chloride 12/03/2016 107   . Carbon Dioxide (CO2) 12/03/2016 27.1   . Urea Nitrogen (BUN) 12/03/2016 11   . Creatinine 12/03/2016 0.8   . Glomerular Filtration Ra* 12/03/2016 100   . Calcium 12/03/2016 9.9   . AST  12/03/2016 14   . ALT  12/03/2016 7   . Alk Phos (alkaline Phosp* 12/03/2016 56   . Albumin 12/03/2016 4.5   . Bilirubin, Total 12/03/2016 0.4   . Protein, Total 12/03/2016 7.1   . A/G Ratio 12/03/2016 1.7     Colonoscopy images and report reviewed and discussed with patient. Show the internal hemorrhoids and no lesion throughout the large intestine. No abcsess seen on the images.   See procedure report for anoscopy details.   ASSESSMENT: Ms. Heckert is a 36 y.o. female presenting for consultation for suspected perianal abscess. Upon evaluation the patient was found with a chronic thrombosed external hemorrhoid and multiple internal and external hemorrhoid. Patient most likely had a small infected external hemorrhoid that drained spontaneously and resolved.  At this evaluation patient was found with multiple internal and external hemorrhoid. Due to the chronically thrombosed hemorrhoid that has not decreased in size and the frequent rectal bleeding, patient will benefit of hemorrhoidectomy in addition to the increased fiber diet and adequate water intake. Patient was oriented about the diagnosis of hemorrhoids, its  implication, the treatment alternatives of observation vs surgical excision, its risks and benefits, and post operative care. Patient also was instructed about important life changing habits to decrease hemorrhoid inflammation. Will proceed with hemorrhoidectomy as discussed with patient.    PLAN: 1. Anorectal exam under anesthesia CPT: 45990 2. Internal and External Hemorrhoidectomy Complex CPT: 46260 3. Do not take aspirin or its derivatives 5-7 days before and after surgery 4. CBC, CMP - Done 5. Follow up pre admission instruction  6. Increase fiber supplementation on diet 7. Adequate fluid intake (6-8 glasses of water a day) 8. Avoid spicy and fatty food 9. Warm baths (Sitz bath) 2 or 3 times a day may help with symptoms 10. Avoid sitting on toilet for a prolonged time 11. Go to the bathroom only when have the urge to have the bowel movement 12. Topical steroids may be used for the relieve of pruritus and inflammation 13. Avoid constipation or diarrhea (may use stool softener)  Patient verbalized understanding, all questions were answered, and were agreeable with the plan outlined above.   Herbert Pun, MD  Electronically signed by Herbert Pun, MD

## 2017-01-12 NOTE — H&P (Signed)
Theresa Carpenter is a 36 y.o. female who presents to the Clinic for consultation at the request of Dr. Alice Reichert for evaluation of suspected perianal abscess.  PCP:  Tomasa Hose Achirimofor, MD  HISTORY OF PRESENT ILLNESS: Theresa Carpenter comes to the clinic with the complain of a mass on the perianal area. The patient refers that starting on August she felt the mass and at that moment she saw blood and pus. The mass was associated with acute sharp pain that did not radiated to other part of the bodies. Patient did not seek medical evaluation at that moment. Patient refers that pain started to improve by itself with time. On October the patient had a colonoscopy due to this complains of rectal bleeding and purulent secretions. Colonoscopy showed the suspected abscess that was not draining. Patient denies any other episode of purulent secretions other than the one in August. GI physician oriented the patient to see a surgeon for treatment of the abscess but it was until today this week that the patient contacted our department. Today, patient complain of episodic pain on the perianal area intercalated with pressure sensation. Patient also refers a palpable hard mass on the anal area. Pain does not radiates. Again, patient refers not seen pus since August. Denies fever or chills. Refers constipation. Refers rectal bleeding 3 times a week.    PROBLEM LIST:         Problem List  Date Reviewed: 12/03/2016         Noted   Vomiting 12/03/2016      GENERAL REVIEW OF SYSTEMS:  General ROS: negative for - chills, fatigue, fever, weight gain or weight loss Allergy and Immunology ROS: negative for - hives  Hematological and Lymphatic ROS: negative for - bleeding problems or bruising, negative for palpable nodes Endocrine ROS: negative for - heat or cold intolerance, hair changes Respiratory ROS: negative for - cough, shortness of breath or wheezing Cardiovascular ROS: no chest pain or palpitations GI ROS:  negative for nausea, vomiting, abdominal pain, diarrhea, constipation Musculoskeletal ROS: negative for - joint swelling or muscle pain Neurological ROS: negative for - confusion, syncope Dermatological ROS: negative for pruritus and rash  MEDICATIONS: CurrentMedications        Current Outpatient Medications  Medication Sig Dispense Refill  . ALPRAZolam (XANAX) 0.5 MG tablet Take 0.5 mg by mouth nightly as needed for Sleep.    . hydrOXYzine (ATARAX) 25 MG tablet Take 25 mg by mouth 3 (three) times daily as needed for Itching.    . metoprolol succinate (TOPROL-XL) 25 MG XL tablet Take 25 mg by mouth once daily.    . polyethylene glycol (MIRALAX) powder AS DIRECTED FOR COLONIC PREP. 255 g 0  . promethazine (PHENERGAN) 25 mg/mL injection Inject into the muscle every 6 (six) hours as needed for Nausea.    Marland Kitchen topiramate (TOPAMAX) 25 MG tablet Take 25 mg by mouth 2 (two) times daily.     No current facility-administered medications for this visit.       ALLERGIES: Patient has no known allergies.  PAST MEDICAL HISTORY:     Past Medical History:  Diagnosis Date  . Cyclical vomiting syndrome   . HTN (hypertension)     PAST SURGICAL HISTORY:      Past Surgical History:  Procedure Laterality Date  . CHOLECYSTECTOMY    . COLONOSCOPY  12/21/2016   Hyperplastic colon polyp/Repeat 6yr at age 36/TKT    FAMILY HISTORY:      Family History  Problem  Relation Age of Onset  . Colon polyps Father   . Colon cancer Neg Hx   . Rectal cancer Neg Hx   . Crohn's disease Neg Hx   . Ulcerative colitis Neg Hx      SOCIAL HISTORY: Social History        Socioeconomic History  . Marital status: Single    Spouse name: None  . Number of children: None  . Years of education: None  . Highest education level: None  Social Needs  . Financial resource strain: None  . Food insecurity - worry: None  . Food insecurity - inability: None  . Transportation needs  - medical: None  . Transportation needs - non-medical: None  Occupational History  . None  Tobacco Use  . Smoking status: Current Every Day Smoker  . Smokeless tobacco: Never Used  Substance and Sexual Activity  . Alcohol use: Never    Frequency: Never  . Drug use: Never  . Sexual activity: Defer  Other Topics Concern  . None  Social History Narrative  . None    PHYSICAL EXAM:    Vitals:   01/11/17 0853  BP: 104/75  Pulse: 94  Temp: 36.9 C (98.4 F)   Body mass index is 26.5 kg/m. Weight: 67.9 kg (149 lb 9.6 oz)   General Appearance:    Alert, cooperative, no distress, appears stated age  Head:     Atraumatic, normocephalic  Eyes:   Anciteric, no erythema, no secretions  Neck:   Supple, symmetrical, no JVD, no palpable lymph nodes  Mouth:   Lips, mucosa, and tongue normal;   Lungs:     Clear to auscultation bilaterally, respirations unlabored   Heart:    Regular rate and rhythm, S1 and S2 normal, no murmur, rub   or gallop  Abdomen:  Rectum:     Soft, non-tender, bowel sounds active all four quadrants,    no masses, no organomegaly   Palpable external mass consistent wit chronic thrombosed external hemorrhoid. Anterior anal tag. No palpable fluctuant mass. Adequate sphincter tone.   Extremities:   Extremities normal, atraumatic, no cyanosis or edema  Skin:   Skin color, texture, turgor normal, no rashes or lesions   Neurologic:   Grossly intact.    REVIEW OF DATA: I have reviewed the following data today:      Office Visit on 12/03/2016  Component Date Value  . WBC (White Blood Cell Co* 12/03/2016 8.9   . RBC (Red Blood Cell Coun* 12/03/2016 4.03*  . Hemoglobin 12/03/2016 12.9   . Hematocrit 12/03/2016 39.5   . MCV (Mean Corpuscular Vo* 12/03/2016 98.0   . MCH (Mean Corpuscular He* 12/03/2016 32.0*  . MCHC (Mean Corpuscular H* 12/03/2016 32.7   . Platelet Count 12/03/2016 298   . RDW-CV (Red Cell Distrib* 12/03/2016 15.7*  . MPV (Mean Platelet  Volum* 12/03/2016 11.1   . Neutrophils 12/03/2016 5.68   . Lymphocytes 12/03/2016 2.34   . Monocytes 12/03/2016 0.51   . Eosinophils 12/03/2016 0.32   . Basophils 12/03/2016 0.03   . Neutrophil % 12/03/2016 63.8   . Lymphocyte % 12/03/2016 26.3   . Monocyte % 12/03/2016 5.7   . Eosinophil % 12/03/2016 3.6   . Basophil% 12/03/2016 0.3   . Immature Granulocyte % 12/03/2016 0.3   . Immature Granulocyte Cou* 12/03/2016 0.03   . C Reactive Protein - Lab* 12/03/2016 3.1   . Glucose 12/03/2016 80   . Sodium 12/03/2016 139   . Potassium 12/03/2016  4.9   . Chloride 12/03/2016 107   . Carbon Dioxide (CO2) 12/03/2016 27.1   . Urea Nitrogen (BUN) 12/03/2016 11   . Creatinine 12/03/2016 0.8   . Glomerular Filtration Ra* 12/03/2016 100   . Calcium 12/03/2016 9.9   . AST  12/03/2016 14   . ALT  12/03/2016 7   . Alk Phos (alkaline Phosp* 12/03/2016 56   . Albumin 12/03/2016 4.5   . Bilirubin, Total 12/03/2016 0.4   . Protein, Total 12/03/2016 7.1   . A/G Ratio 12/03/2016 1.7     Colonoscopy images and report reviewed and discussed with patient. Show the internal hemorrhoids and no lesion throughout the large intestine. No abcsess seen on the images.   See procedure report for anoscopy details.   ASSESSMENT: Theresa Carpenter is a 36 y.o. female presenting for consultation for suspected perianal abscess. Upon evaluation the patient was found with a chronic thrombosed external hemorrhoid and multiple internal and external hemorrhoid. Patient most likely had a small infected external hemorrhoid that drained spontaneously and resolved.  At this evaluation patient was found with multiple internal and external hemorrhoid. Due to the chronically thrombosed hemorrhoid that has not decreased in size and the frequent rectal bleeding, patient will benefit of hemorrhoidectomy in addition to the increased fiber diet and adequate water intake. Patient was oriented about the diagnosis of hemorrhoids, its  implication, the treatment alternatives of observation vs surgical excision, its risks and benefits, and post operative care. Patient also was instructed about important life changing habits to decrease hemorrhoid inflammation. Will proceed with hemorrhoidectomy as discussed with patient.    PLAN: 1. Anorectal exam under anesthesia CPT: 45990 2. Internal and External Hemorrhoidectomy Complex CPT: 46260 3. Do not take aspirin or its derivatives 5-7 days before and after surgery 4. CBC, CMP - Done 5. Follow up pre admission instruction  6. Increase fiber supplementation on diet 7. Adequate fluid intake (6-8 glasses of water a day) 8. Avoid spicy and fatty food 9. Warm baths (Sitz bath) 2 or 3 times a day may help with symptoms 10. Avoid sitting on toilet for a prolonged time 11. Go to the bathroom only when have the urge to have the bowel movement 12. Topical steroids may be used for the relieve of pruritus and inflammation 13. Avoid constipation or diarrhea (may use stool softener)  Patient verbalized understanding, all questions were answered, and were agreeable with the plan outlined above.   Herbert Pun, MD  Electronically signed by Herbert Pun, MD

## 2017-01-20 ENCOUNTER — Encounter
Admission: RE | Admit: 2017-01-20 | Discharge: 2017-01-20 | Disposition: A | Discharge status: Home / Self Care | Payer: Medicaid Other | Source: Ambulatory Visit | Attending: General Surgery | Admitting: General Surgery

## 2017-01-20 ENCOUNTER — Other Ambulatory Visit: Payer: Self-pay

## 2017-01-20 ENCOUNTER — Ambulatory Visit: Payer: Self-pay | Admitting: General Surgery

## 2017-01-20 HISTORY — DX: Family history of other specified conditions: Z84.89

## 2017-01-20 NOTE — Pre-Procedure Instructions (Signed)
EchocardiogramW Colorflow Spectral Doppler10/05/2015 [facility] Component Name Value Ref Range  LV Diastolic Diameter PLAX 4.1 cm  LV Systolic Diameter PLAX 2.72 cm  IVS Diastolic Thickness 0.92 cm  LVPW Diastolic Thickness 0.91 cm  Aortic Root Diameter 2.90 cm  LA Systolic Diameter LX 3.40 cm  LA Area 4C View 12.70 cm2  LA Area 2C View 13.00 cm2  AV Peak Velocity 1.23 m/s  AV Peak Gradient 6.00   Mitral E Point Velocity 0.01 m/s  TR Peak Velocity 2.22 m/s  PV Peak Velocity 0.87 m/s  Pulmonary Artery Systolic Pressure 20.00 mmHg   PV peak gradient 3.00 mmHg   Result Narrative   Normal left ventricular systolic function, ejection fraction 60 to 65%  Normal right ventricular systolic function  Mildly elevated right atrial pressure  No significant valvular abnormalities  Status Results Details   Encounter Summary

## 2017-01-20 NOTE — Patient Instructions (Signed)
  [facility]: [facility] 2nd floor medical mall [facility](Medical Mall Entrance-take elevator on left to 2nd floor.  Check in with surgery information desk.) To find out your arrival time please call [phone] between 1PM - 3PM on 01-26-17 TUESDAY  Remember: Instructions that are not followed completely may result in serious medical risk, up to and including death, or upon the discretion of your surgeon and anesthesiologist your surgery may need to be rescheduled.    _x___ 1. Do not eat food after midnight the night before your procedure. NO GUM CHEWING OR CANDY AFTER MIDNIGHT.  You may drink clear liquids up to 2 hours before you are scheduled to arrive at the hospital for your procedure.  Do not drink clear liquids within 2 hours of your scheduled arrival to the hospital.  Clear liquids include  --Water or Apple juice without pulp  --Clear carbohydrate beverage such as ClearFast or Gatorade  --Black Coffee or Clear Tea (No milk, no creamers, do not add anything to the coffee or Tea      __x__ 2. No Alcohol for 24 hours before or after surgery.   __x__3. No Smoking for 24 prior to surgery.   ____  4. Bring all medications with you on the day of surgery if instructed.    __x__ 5. Notify your doctor if there is any change in your medical condition     (cold, fever, infections).     Do not wear jewelry, make-up, hairpins, clips or nail polish.  Do not wear lotions, powders, or perfumes. You may wear deodorant.  Do not shave 48 hours prior to surgery. Men may shave face and neck.  Do not bring valuables to the hospital.    [facility] for any belongings or valuables.               Contacts, dentures or bridgework may not be worn into surgery.  Leave your suitcase in the car. After surgery it may be brought to your room.  For patients admitted to the hospital, discharge time is determined by your treatment  team.   Patients discharged the day of surgery will not be allowed to drive home.  You will need someone to drive you home and stay with you the night of your procedure.    Please read over the following fact sheets that you were given:   [facility] and or MRSA Information   _x___ TAKE THE FOLLOWING MEDICATION THE MORNING OF SURGERY WITH A SMALL SIP OF WATER. These include:  1. METOPROLOL  2. TOPAMAX  3.  4.  5.  6.  ____Fleets enema or Magnesium Citrate as directed.   ____ Use CHG Soap or sage wipes as directed on instruction sheet   ____ Use inhalers on the day of surgery and bring to hospital day of surgery  ____ Stop Metformin and Janumet 2 days prior to surgery.    ____ Take 1/2 of usual insulin dose the night before surgery and none on the morning surgery.   ____ Follow recommendations from Cardiologist, Pulmonologist or PCP regarding stopping Aspirin, Coumadin, Plavix ,Eliquis, Effient, or Pradaxa, and Pletal.  X____Stop Anti-inflammatories such as Advil, Aleve, Ibuprofen, Motrin, Naproxen, Naprosyn, Goodies powders or aspirin products NOW-OK to take Tylenol   ____ Stop supplements until after surgery.   ____ Bring C-Pap to the hospital.

## 2017-01-21 ENCOUNTER — Encounter
Admission: RE | Admit: 2017-01-21 | Discharge: 2017-01-21 | Disposition: A | Discharge status: Home / Self Care | Payer: Medicaid Other | Source: Ambulatory Visit | Attending: General Surgery | Admitting: General Surgery

## 2017-01-21 DIAGNOSIS — R000 Tachycardia, unspecified: Secondary | ICD-10-CM | POA: Diagnosis not present

## 2017-01-21 DIAGNOSIS — I10 Essential (primary) hypertension: Secondary | ICD-10-CM | POA: Insufficient documentation

## 2017-01-21 HISTORY — DX: Tachycardia, unspecified: R00.0

## 2017-01-21 NOTE — Pre-Procedure Instructions (Addendum)
Notified Dr Henrene HawkingKephart of pts EKG that is now showing A-flutter with 2:1 AV conduction.  [facility] but was undetermined what was causing her tachycardia. Dr Henrene HawkingKephart wanting cardiac clearnace.  [facility] with receptionist at [facility] and notified her I was sending her a clearance.  She wanted me to put it to the attention of Dr Graciela HusbandsKlein.  Faxed to Dr Graciela HusbandsKlein and Dr Maia Planintron with fax confirmation received from both offices.  Called and spoke with sherry at Dr Jake Michaelisintrons office regarding the need for clearance. She verbalized she would relay info to [place] and Dr Maia Planintron

## 2017-01-26 NOTE — Pre-Procedure Instructions (Signed)
CARDIAC CLEARANCE ON CHART FROM DR CALLWOOD.  

## 2017-01-26 NOTE — Pre-Procedure Instructions (Signed)
Called [facility] to find out status of clearance since they just faxed me back the coversheet  From [facility] that states that pt needs an appointment since she has not been seen since 2017.  I left a message for Dr Koren BoundKleins nurse Elita QuickPam to call me back

## 2017-01-27 ENCOUNTER — Ambulatory Visit: Payer: Medicaid Other | Admitting: Anesthesiology

## 2017-01-27 ENCOUNTER — Ambulatory Visit
Admission: RE | Admit: 2017-01-27 | Discharge: 2017-01-27 | Disposition: A | Discharge status: Home / Self Care | Payer: Medicaid Other | Source: Ambulatory Visit | Attending: General Surgery | Admitting: General Surgery

## 2017-01-27 ENCOUNTER — Encounter
Admission: RE | Disposition: A | Discharge status: Home / Self Care | Payer: Self-pay | Source: Ambulatory Visit | Attending: General Surgery

## 2017-01-27 ENCOUNTER — Encounter: Payer: Self-pay | Admitting: *Deleted

## 2017-01-27 DIAGNOSIS — Z79899 Other long term (current) drug therapy: Secondary | ICD-10-CM | POA: Diagnosis not present

## 2017-01-27 DIAGNOSIS — K5900 Constipation, unspecified: Secondary | ICD-10-CM | POA: Diagnosis not present

## 2017-01-27 DIAGNOSIS — K645 Perianal venous thrombosis: Secondary | ICD-10-CM | POA: Diagnosis not present

## 2017-01-27 DIAGNOSIS — F17200 Nicotine dependence, unspecified, uncomplicated: Secondary | ICD-10-CM | POA: Diagnosis not present

## 2017-01-27 DIAGNOSIS — Z86010 Personal history of colonic polyps: Secondary | ICD-10-CM | POA: Insufficient documentation

## 2017-01-27 DIAGNOSIS — I10 Essential (primary) hypertension: Secondary | ICD-10-CM | POA: Insufficient documentation

## 2017-01-27 DIAGNOSIS — K641 Second degree hemorrhoids: Secondary | ICD-10-CM | POA: Insufficient documentation

## 2017-01-27 DIAGNOSIS — Z8371 Family history of colonic polyps: Secondary | ICD-10-CM | POA: Insufficient documentation

## 2017-01-27 HISTORY — PX: RECTAL EXAM UNDER ANESTHESIA: SHX6399

## 2017-01-27 HISTORY — PX: HEMORRHOID SURGERY: SHX153

## 2017-01-27 LAB — URINE DRUG SCREEN, QUALITATIVE (ARMC ONLY)
Amphetamines, Ur Screen: NOT DETECTED
BARBITURATES, UR SCREEN: NOT DETECTED
BENZODIAZEPINE, UR SCRN: POSITIVE — AB
Cannabinoid 50 Ng, Ur ~~LOC~~: POSITIVE — AB
Cocaine Metabolite,Ur ~~LOC~~: NOT DETECTED
MDMA (Ecstasy)Ur Screen: NOT DETECTED
METHADONE SCREEN, URINE: NOT DETECTED
OPIATE, UR SCREEN: NOT DETECTED
PHENCYCLIDINE (PCP) UR S: NOT DETECTED
Tricyclic, Ur Screen: NOT DETECTED

## 2017-01-27 LAB — POCT PREGNANCY, URINE: Preg Test, Ur: NEGATIVE

## 2017-01-27 SURGERY — HEMORRHOIDECTOMY
Anesthesia: General

## 2017-01-27 MED ORDER — ACETAMINOPHEN 10 MG/ML IV SOLN
INTRAVENOUS | Status: DC | PRN
  Administered 2017-01-27: 1000 mg via INTRAVENOUS

## 2017-01-27 MED ORDER — LABETALOL HCL 5 MG/ML IV SOLN
INTRAVENOUS | Status: AC
  Filled 2017-01-27: qty 4

## 2017-01-27 MED ORDER — ROCURONIUM BROMIDE 100 MG/10ML IV SOLN
INTRAVENOUS | Status: DC | PRN
  Administered 2017-01-27: 50 mg via INTRAVENOUS

## 2017-01-27 MED ORDER — LIDOCAINE HCL (CARDIAC) 20 MG/ML IV SOLN
INTRAVENOUS | Status: DC | PRN
  Administered 2017-01-27: 80 mg via INTRAVENOUS

## 2017-01-27 MED ORDER — LABETALOL HCL 5 MG/ML IV SOLN
INTRAVENOUS | Status: DC | PRN
  Administered 2017-01-27 (×2): 5 mg via INTRAVENOUS

## 2017-01-27 MED ORDER — PROMETHAZINE HCL 25 MG/ML IJ SOLN: 6.2500 mg | INTRAMUSCULAR | Status: DC | PRN

## 2017-01-27 MED ORDER — FENTANYL CITRATE (PF) 100 MCG/2ML IJ SOLN
INTRAMUSCULAR | Status: AC
  Filled 2017-01-27: qty 2

## 2017-01-27 MED ORDER — PROPOFOL 10 MG/ML IV BOLUS
INTRAVENOUS | Status: DC | PRN
  Administered 2017-01-27: 160 mg via INTRAVENOUS

## 2017-01-27 MED ORDER — SCOPOLAMINE 1 MG/3DAYS TD PT72
1.0000 | MEDICATED_PATCH | TRANSDERMAL | Status: DC
  Administered 2017-01-27: 1.5 mg via TRANSDERMAL

## 2017-01-27 MED ORDER — PSYLLIUM 28 % PO PACK: 1.0000 | PACK | Freq: Two times a day (BID) | ORAL | 3 refills | Status: AC

## 2017-01-27 MED ORDER — PHENYLEPHRINE HCL 10 MG/ML IJ SOLN
INTRAMUSCULAR | Status: DC | PRN
  Administered 2017-01-27: 200 ug via INTRAVENOUS
  Administered 2017-01-27: 100 ug via INTRAVENOUS

## 2017-01-27 MED ORDER — MIDAZOLAM HCL 2 MG/2ML IJ SOLN
INTRAMUSCULAR | Status: DC | PRN
  Administered 2017-01-27: 2 mg via INTRAVENOUS

## 2017-01-27 MED ORDER — POLYETHYLENE GLYCOL 3350 17 GM/SCOOP PO POWD: 17.0000 g | Freq: Every day | ORAL | 0 refills | Status: AC

## 2017-01-27 MED ORDER — SODIUM CHLORIDE 0.9 % IJ SOLN
INTRAMUSCULAR | Status: AC
  Filled 2017-01-27: qty 10

## 2017-01-27 MED ORDER — METRONIDAZOLE IN NACL 5-0.79 MG/ML-% IV SOLN
500.0000 mg | INTRAVENOUS | Status: AC
  Administered 2017-01-27: 500 mg via INTRAVENOUS
  Filled 2017-01-27: qty 100

## 2017-01-27 MED ORDER — FENTANYL CITRATE (PF) 100 MCG/2ML IJ SOLN: 25.0000 ug | INTRAMUSCULAR | Status: DC | PRN

## 2017-01-27 MED ORDER — DEXAMETHASONE SODIUM PHOSPHATE 10 MG/ML IJ SOLN
INTRAMUSCULAR | Status: DC | PRN
  Administered 2017-01-27: 10 mg via INTRAVENOUS

## 2017-01-27 MED ORDER — OXYCODONE HCL 5 MG/5ML PO SOLN: 5.0000 mg | Freq: Once | ORAL | Status: AC | PRN

## 2017-01-27 MED ORDER — BUPIVACAINE LIPOSOME 1.3 % IJ SUSP: INTRAMUSCULAR | Status: DC | PRN

## 2017-01-27 MED ORDER — MIDAZOLAM HCL 2 MG/2ML IJ SOLN
INTRAMUSCULAR | Status: AC
  Filled 2017-01-27: qty 2

## 2017-01-27 MED ORDER — LACTATED RINGERS IV SOLN
INTRAVENOUS | Status: DC
  Administered 2017-01-27: 07:00:00 via INTRAVENOUS

## 2017-01-27 MED ORDER — ONDANSETRON HCL 4 MG/2ML IJ SOLN
INTRAMUSCULAR | Status: DC | PRN
  Administered 2017-01-27: 4 mg via INTRAVENOUS

## 2017-01-27 MED ORDER — LIDOCAINE HCL (PF) 2 % IJ SOLN
INTRAMUSCULAR | Status: AC
  Filled 2017-01-27: qty 10

## 2017-01-27 MED ORDER — ACETAMINOPHEN 10 MG/ML IV SOLN
INTRAVENOUS | Status: AC
  Filled 2017-01-27: qty 100

## 2017-01-27 MED ORDER — FAMOTIDINE 20 MG PO TABS
20.0000 mg | ORAL_TABLET | Freq: Once | ORAL | Status: AC
  Administered 2017-01-27: 20 mg via ORAL

## 2017-01-27 MED ORDER — MEPERIDINE HCL 50 MG/ML IJ SOLN: 6.2500 mg | INTRAMUSCULAR | Status: DC | PRN

## 2017-01-27 MED ORDER — BUPIVACAINE LIPOSOME 1.3 % IJ SUSP
INTRAMUSCULAR | Status: AC
  Filled 2017-01-27: qty 20

## 2017-01-27 MED ORDER — BUPIVACAINE-EPINEPHRINE (PF) 0.5% -1:200000 IJ SOLN
INTRAMUSCULAR | Status: AC
  Filled 2017-01-27: qty 30

## 2017-01-27 MED ORDER — SODIUM CHLORIDE 0.9 % IJ SOLN: INTRAMUSCULAR | Status: DC | PRN

## 2017-01-27 MED ORDER — CIPROFLOXACIN IN D5W 400 MG/200ML IV SOLN
400.0000 mg | INTRAVENOUS | Status: AC
  Administered 2017-01-27: 400 mg via INTRAVENOUS

## 2017-01-27 MED ORDER — HYDROCODONE-ACETAMINOPHEN 5-325 MG PO TABS: 1.0000 | ORAL_TABLET | ORAL | 0 refills | Status: AC | PRN

## 2017-01-27 MED ORDER — BUPIVACAINE HCL (PF) 0.5 % IJ SOLN
INTRAMUSCULAR | Status: DC | PRN
  Administered 2017-01-27: 30 mL

## 2017-01-27 MED ORDER — EPHEDRINE SULFATE 50 MG/ML IJ SOLN
INTRAMUSCULAR | Status: AC
  Filled 2017-01-27: qty 1

## 2017-01-27 MED ORDER — BUPIVACAINE HCL (PF) 0.5 % IJ SOLN
INTRAMUSCULAR | Status: AC
  Filled 2017-01-27: qty 30

## 2017-01-27 MED ORDER — OXYCODONE HCL 5 MG PO TABS
5.0000 mg | ORAL_TABLET | Freq: Once | ORAL | Status: AC | PRN
  Administered 2017-01-27: 5 mg via ORAL

## 2017-01-27 MED ORDER — KETOROLAC TROMETHAMINE 30 MG/ML IJ SOLN
INTRAMUSCULAR | Status: DC | PRN
  Administered 2017-01-27: 30 mg via INTRAVENOUS

## 2017-01-27 MED ORDER — FENTANYL CITRATE (PF) 100 MCG/2ML IJ SOLN
INTRAMUSCULAR | Status: DC | PRN
  Administered 2017-01-27 (×2): 50 ug via INTRAVENOUS

## 2017-01-27 MED ORDER — SUGAMMADEX SODIUM 200 MG/2ML IV SOLN
INTRAVENOUS | Status: DC | PRN
  Administered 2017-01-27: 200 mg via INTRAVENOUS

## 2017-01-27 SURGICAL SUPPLY — 38 items
BLADE SURG 15 STRL LF DISP TIS (BLADE) IMPLANT
BLADE SURG 15 STRL SS (BLADE)
CANISTER SUCT 1200ML W/VALVE (MISCELLANEOUS) ×3 IMPLANT
DECANTER SPIKE VIAL GLASS SM (MISCELLANEOUS) ×3 IMPLANT
DRAPE LAPAROTOMY 100X77 ABD (DRAPES) ×3 IMPLANT
DRAPE LEGGINS SURG 28X43 STRL (DRAPES) IMPLANT
DRAPE UNDER BUTTOCK W/FLU (DRAPES) IMPLANT
ELECT REM PT RETURN 9FT ADLT (ELECTROSURGICAL) ×3
ELECTRODE REM PT RTRN 9FT ADLT (ELECTROSURGICAL) ×1 IMPLANT
GAUZE SPONGE 4X4 12PLY STRL (GAUZE/BANDAGES/DRESSINGS) ×3 IMPLANT
GLOVE BIO SURGEON STRL SZ 6.5 (GLOVE) ×4 IMPLANT
GLOVE BIO SURGEON STRL SZ7.5 (GLOVE) IMPLANT
GLOVE BIO SURGEONS STRL SZ 6.5 (GLOVE) ×2
GLOVE INDICATOR 6.5 STRL GRN (GLOVE) ×3 IMPLANT
GOWN STRL REUS W/ TWL LRG LVL3 (GOWN DISPOSABLE) ×2 IMPLANT
GOWN STRL REUS W/TWL LRG LVL3 (GOWN DISPOSABLE) ×4
HEMOSTAT SURGICEL 2X3 (HEMOSTASIS) ×3 IMPLANT
LABEL OR SOLS (LABEL) IMPLANT
NEEDLE HYPO 25X1 1.5 SAFETY (NEEDLE) ×3 IMPLANT
NS IRRIG 500ML POUR BTL (IV SOLUTION) ×3 IMPLANT
PACK BASIN MINOR ARMC (MISCELLANEOUS) ×3 IMPLANT
PAD ABD DERMACEA PRESS 5X9 (GAUZE/BANDAGES/DRESSINGS) ×3 IMPLANT
PAD PREP 24X41 OB/GYN DISP (PERSONAL CARE ITEMS) IMPLANT
SHEARS HARMONIC 9CM CVD (BLADE) ×3 IMPLANT
SOL PREP PVP 2OZ (MISCELLANEOUS) ×3
SOLUTION PREP PVP 2OZ (MISCELLANEOUS) ×1 IMPLANT
STAPLER PROXIMATE HCS (STAPLE) IMPLANT
STRAP SAFETY BODY (MISCELLANEOUS) IMPLANT
SURGILUBE 2OZ TUBE FLIPTOP (MISCELLANEOUS) ×3 IMPLANT
SUT CHROMIC 2 0 SH (SUTURE) IMPLANT
SUT CHROMIC 3 0 SH 27 (SUTURE) IMPLANT
SUT ETHILON 3-0 FS-10 30 BLK (SUTURE)
SUT PROLENE 3 0 PS 2 (SUTURE) IMPLANT
SUT VIC AB 2-0 SH 27 (SUTURE) ×2
SUT VIC AB 2-0 SH 27XBRD (SUTURE) ×1 IMPLANT
SUTURE EHLN 3-0 FS-10 30 BLK (SUTURE) IMPLANT
SYR 10ML LL (SYRINGE) IMPLANT
SYR 30ML LL (SYRINGE) ×3 IMPLANT

## 2017-01-27 NOTE — Anesthesia Procedure Notes (Signed)
Procedure Name: Intubation Date/Time: 01/27/2017 7:35 AM Performed by: Letitia Neri, CRNA Pre-anesthesia Checklist: Patient identified, Emergency Drugs available, Suction available, Patient being monitored and Timeout performed Patient Re-evaluated:Patient Re-evaluated prior to induction Oxygen Delivery Method: Circle system utilized Preoxygenation: Pre-oxygenation with 100% oxygen Induction Type: IV induction Ventilation: Mask ventilation without difficulty Laryngoscope Size: Mac and 3 Grade View: Grade I Tube type: Oral Tube size: 7.0 mm Number of attempts: 1 Placement Confirmation: ETT inserted through vocal cords under direct vision,  positive ETCO2 and breath sounds checked- equal and bilateral Secured at: 22 cm Tube secured with: Tape Dental Injury: Teeth and Oropharynx as per pre-operative assessment

## 2017-01-27 NOTE — Discharge Instructions (Signed)
°  Diet: Resume home heart healthy regular diet.   Activity: No heavy lifting >20 pounds (children, pets, laundry, garbage) or strenuous activity until follow-up, but light activity and walking are encouraged. Do not drive or drink alcohol if taking narcotic pain medications.  Avoid sitting in toilet for a prolonged time. Go to the bathroom just when have the urge.   Wound care: Remove dressing in tonight or before next bowel movement. Gauze inside the anal area will fall by itself. May shower with soapy water and pat dry (do not rub incisions), but no baths or submerging incision underwater until follow-up. (no swimming)   Medications: Resume all home medications. For mild to moderate pain: acetaminophen (Tylenol) or ibuprofen (if no kidney disease). Combining Tylenol with alcohol can substantially increase your risk of causing liver disease. Narcotic pain medications, if prescribed, can be used for severe pain, though may cause nausea, constipation, and drowsiness. Do not combine Tylenol and Percocet within a 6 hour period as Percocet contains Tylenol. If you do not need the narcotic pain medication, you do not need to fill the prescription.  New medications such as Metamucil (fiber supplement) and Polyethylene glycol are to make to stool softer and avoid hurting the wounds area. If develop diarrhea, stop taking it. Avoid constipation  Drink 8 glasses of water daily.   Call office [phone]) at any time if any questions, worsening pain, fevers/chills, bleeding, drainage from incision site, or other concerns.

## 2017-01-27 NOTE — Interval H&P Note (Signed)
History and Physical Interval Note:  01/27/2017 6:54 AM  Theresa Carpenter  has presented today for surgery, with the diagnosis of EXTERNAL AND INTERNAL HEMORRHOIDS  The various methods of treatment have been discussed with the patient and family. After consideration of risks, benefits and other options for treatment, the patient has consented to  Procedure(s): HEMORRHOIDECTOMY (N/A) RECTAL EXAM UNDER ANESTHESIA (N/A) as a surgical intervention .  The patient's history has been reviewed, patient examined, no change in status, stable for surgery.  I have reviewed the patient's chart and labs.  Questions were answered to the patient's satisfaction.     Carolan ShiverEdgardo Cintron-Diaz

## 2017-01-27 NOTE — Anesthesia Post-op Follow-up Note (Signed)
Anesthesia QCDR form completed.        

## 2017-01-27 NOTE — Op Note (Signed)
Preoperative diagnosis: Second degree internal hemorrhoids and thrombosed external hemorrhoid  Postoperative diagnosis: Second degree internal hemorrhoids and thrombosed external hemorrhoid.  Procedure: Anoscopy, hemorrhoidectomy.  Surgeon: Dr. Hazle Quantintron-Diaz  Anesthesia: General  Wound classification: Clean Contaminated  Indications: Patient is a 36 y.o. female was found to have symptomatic hemorrhoids refractory to medical managemen.   Findings: 1. Second degree internal hemorrhoids, Chronic left posterior external hemorrhoid thrombosed. Posterior skin tag, No fissure.  2. Internal and external anal sphincter identified and preserved 3. Adequate hemostasis 4. No masses or lesions identified  Description of procedure: The patient was brought to the operating room and general anesthesia was induced. Patient was placed in the prone jackknife position. A time-out was completed verifying correct patient, procedure, site, positioning, and implant(s) and/or special equipment prior to beginning this procedure. The buttocks were taped apart.  The perineum was prepped and draped in standard sterile fashion. Local anesthetic was injected as a perianal block. An anoscope was introduced and the three hemorrhoidal pedicles were identified. A Kelly clamp was placed near the base of each pedicle near the dentate line and retracted externally to exteriorize the hemorrhoidal pedicles.  Each pedicle was excised in turn in the following fashion. An elliptical incision was made extending from perianal skin to anorectal ring including both internal and external hemorrhoids and excising a minimum amount of anoderm. Flaps were developed on both aspects of the incision, taking care to elevate only skin and mucosa. The dilated venous mass was dissected using Metzenbaum scissors from the underlying sphincter muscle. The base was ligated with a 2-0 Vicryl figure of 8 suture. The pedicle was amputated from the base.  Hemostasis was achieved using electrocautery. Following hemostasis, the skin and mucosal incisions were closed with a running lock stitch of 2-0 Vicryl on the mucosal aspect and converted to subcuticular once skin was encountered. The anal canal was then injected with local anesthetic. A gauze pad was tucked between the gluteal folds.  The patient tolerated the procedure well and was taken to the postanesthesia care unit in stable condition.   Specimen: Right posterior internal and external hemorrhoids, Right anterior internal and external hemorrhoid, Posterior skin tag  Complications: none  EBL: 5mL

## 2017-01-27 NOTE — Anesthesia Postprocedure Evaluation (Signed)
Anesthesia Post Note  Patient: Theresa Carpenter  Procedure(s) Performed: HEMORRHOIDECTOMY (N/A ) RECTAL EXAM UNDER ANESTHESIA (N/A )  Patient location during evaluation: PACU Anesthesia Type: General Level of consciousness: awake and alert and oriented Pain management: pain level controlled Vital Signs Assessment: post-procedure vital signs reviewed and stable Respiratory status: spontaneous breathing, nonlabored ventilation and respiratory function stable Cardiovascular status: blood pressure returned to baseline and stable Postop Assessment: no signs of nausea or vomiting Anesthetic complications: no     Last Vitals:  Vitals:   01/27/17 0925 01/27/17 0949  BP: (!) 140/98 (!) 134/91  Pulse: (!) 102 96  Resp: 16   Temp: (!) 35.7 C   SpO2: 100% 100%    Last Pain:  Vitals:   01/27/17 0949  TempSrc:   PainSc: 4                  [name]

## 2017-01-27 NOTE — Anesthesia Preprocedure Evaluation (Signed)
Anesthesia Evaluation  Patient identified by MRN, date of birth, ID band Patient awake    Reviewed: Allergy & Precautions, NPO status , Patient's Chart, lab work & pertinent test results  History of Anesthesia Complications Negative for: history of anesthetic complications  Airway Mallampati: II  TM Distance: >3 FB Neck ROM: Full    Dental  (+) Poor Dentition   Pulmonary neg sleep apnea, neg COPD, Current Smoker,    breath sounds clear to auscultation- rhonchi (-) wheezing      Cardiovascular hypertension, Pt. on medications (-) CAD, (-) Past MI, (-) Cardiac Stents and (-) CABG  Rhythm:Regular Rate:Normal - Systolic murmurs and - Diastolic murmurs    Neuro/Psych negative neurological ROS  negative psych ROS   GI/Hepatic negative GI ROS, Neg liver ROS,   Endo/Other  negative endocrine ROSneg diabetes  Renal/GU negative Renal ROS     Musculoskeletal negative musculoskeletal ROS (+)   Abdominal (+) - obese,   Peds  Hematology negative hematology ROS (+)   Anesthesia Other Findings Past Medical History: No date: Cyclic vomiting syndrome No date: Family history of adverse reaction to anesthesia     Comment:  BROTHER-N/V No date: Hypertension No date: Tachycardia   Reproductive/Obstetrics                             Anesthesia Physical Anesthesia Plan  ASA: II  Anesthesia Plan: General   Post-op Pain Management:    Induction: Intravenous  PONV Risk Score and Plan: 1 and Ondansetron, Dexamethasone and Scopolamine patch - Pre-op  Airway Management Planned: LMA  Additional Equipment:   Intra-op Plan:   Post-operative Plan:   Informed Consent: I have reviewed the patients History and Physical, chart, labs and discussed the procedure including the risks, benefits and alternatives for the proposed anesthesia with the patient or authorized representative who has indicated his/her  understanding and acceptance.   Dental advisory given  Plan Discussed with: CRNA and Anesthesiologist  Anesthesia Plan Comments:         Anesthesia Quick Evaluation

## 2017-01-27 NOTE — OR Nursing (Signed)
Flagyl and Cipro both given in OR and documented on MAR by OR staff

## 2017-01-27 NOTE — OR Nursing (Signed)
Dr. Priscella MannPenwarden aware of BP/HR and UDS results -  Notified when in to see patient.   Scopolamine patch ordered and applied.  Report given to Mordecai Rasmussenose Carreau, RN - 2 antibiotics ordered - per Rose, we need to start Flagyl on call to OR.

## 2017-01-27 NOTE — Transfer of Care (Signed)
Immediate Anesthesia Transfer of Care Note  Patient: Theresa Carpenter  Procedure(s) Performed: Procedure(s): HEMORRHOIDECTOMY (N/A) RECTAL EXAM UNDER ANESTHESIA (N/A)  Patient Location: PACU  Anesthesia Type:General  Level of Consciousness: sedated  Airway & Oxygen Therapy: Patient Spontanous Breathing and Patient connected to face mask oxygen  Post-op Assessment: Report given to RN and Post -op Vital signs reviewed and stable  Post vital signs: Reviewed and stable  Last Vitals:  Vitals:   01/27/17 0638 01/27/17 0845  BP: 130/88 135/90  Pulse: (!) 110 (!) 108  Resp: 16 (!) 21  Temp:  36.6 C  SpO2: 100% 100%    Complications: No apparent anesthesia complications

## 2017-01-29 LAB — SURGICAL PATHOLOGY

## 2017-03-03 IMAGING — US US PELVIS COMPLETE
1 series · 14 of 25 positions shown · non-contrast
Comparison: August 03, 2012

CLINICAL DATA: Three-day history of pelvic pain

EXAM:
TRANSABDOMINAL AND TRANSVAGINAL ULTRASOUND OF PELVIS
TECHNIQUE: Study was performed transabdominally to optimize pelvic field of
view evaluation and transvaginally to optimize internal visceral
architecture evaluation

[Series 1: us pelvis complete · 0.14mm/px · 14 of 78 slices shown]
[im 1/78]
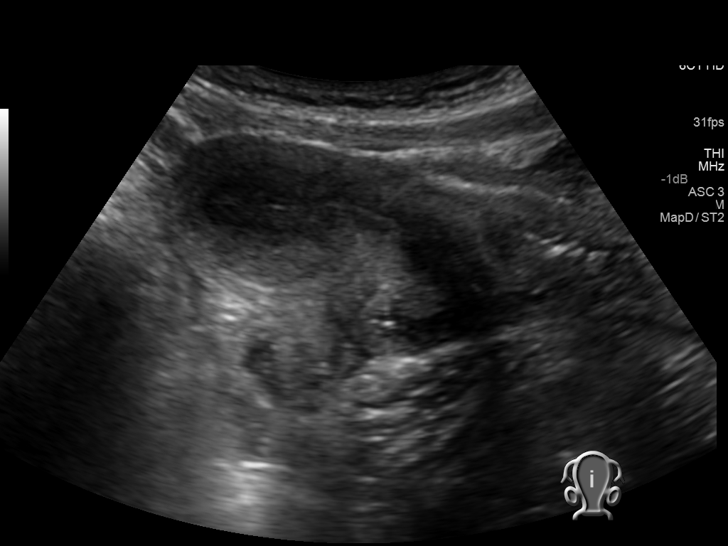
[im 7/78]
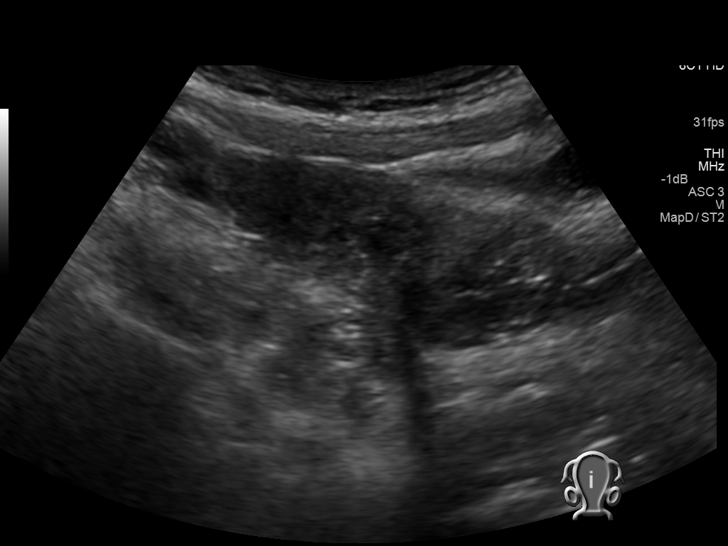
[im 13/78]
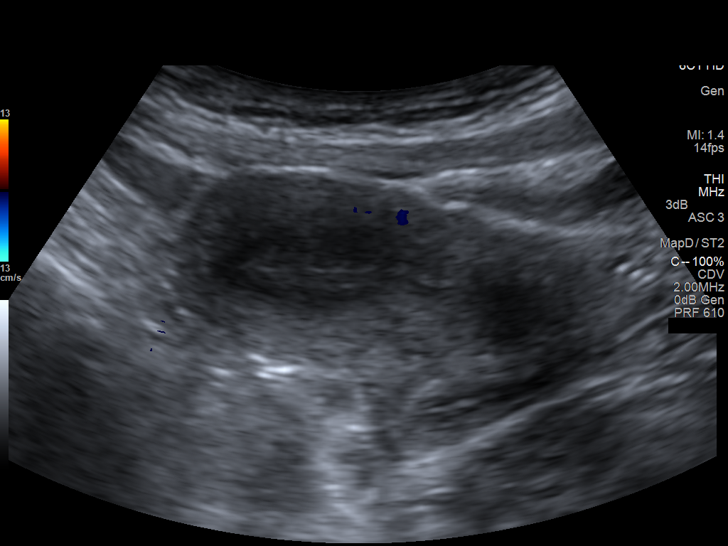
[im 20/78]
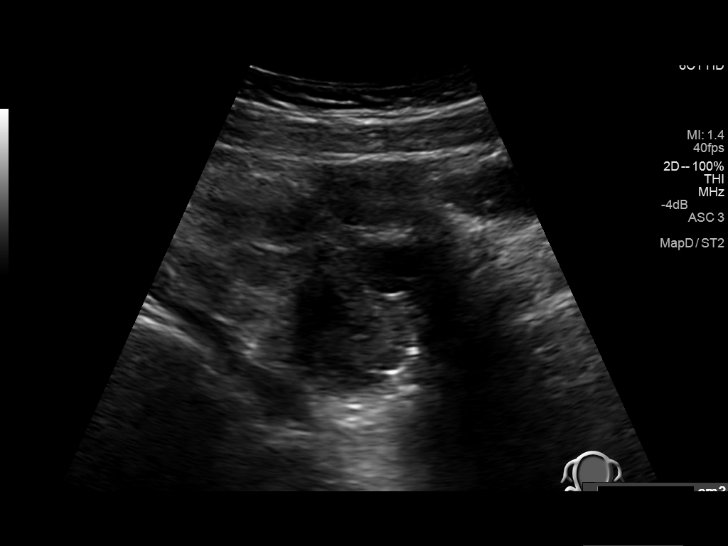
[im 26/78]
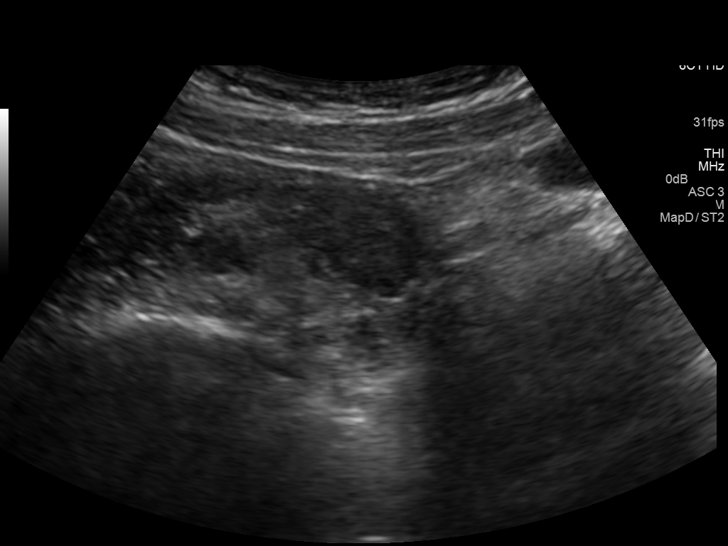
[im 29/78]
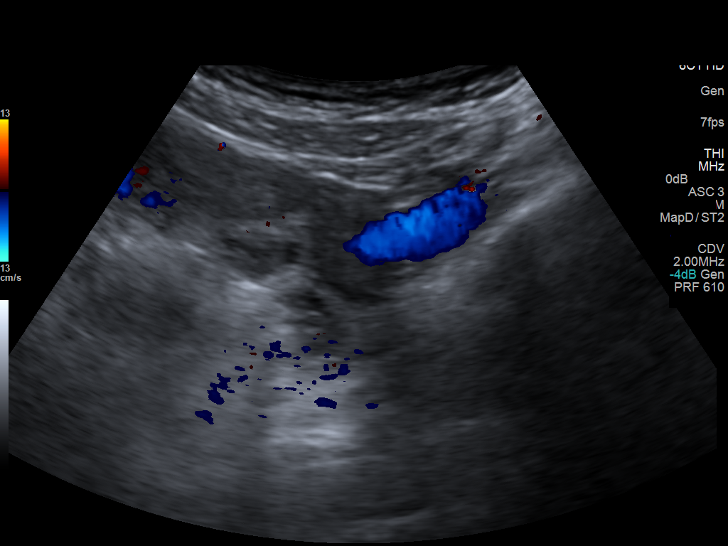
[im 36/78]
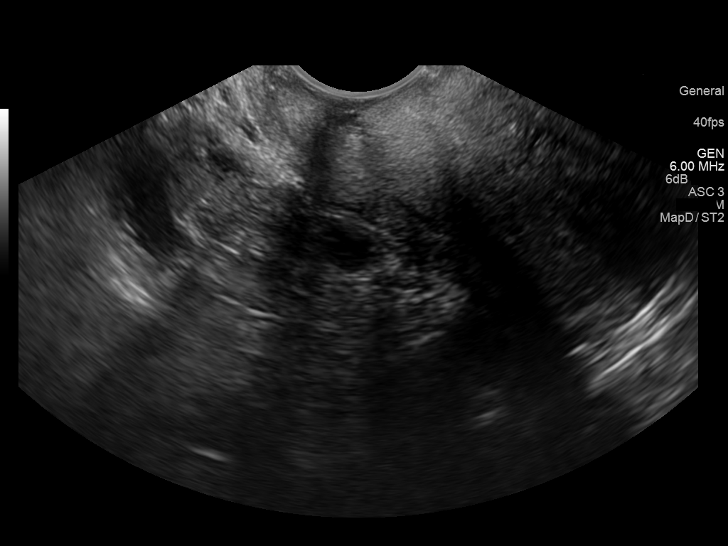
[im 42/78]
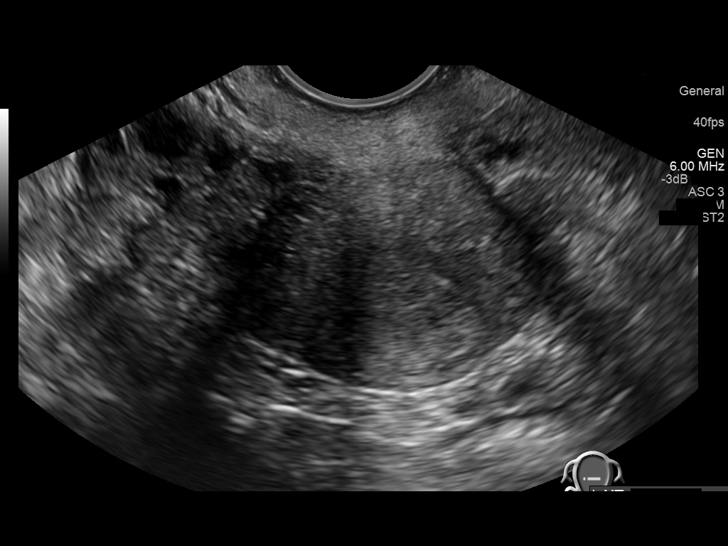
[im 49/78]
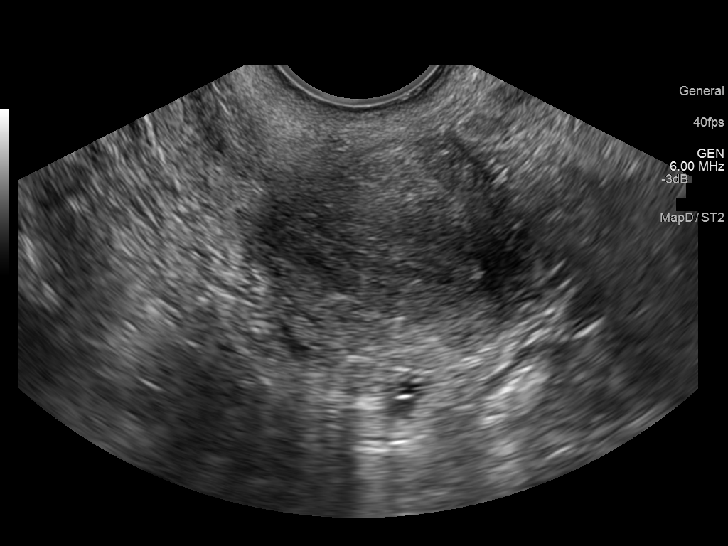
[im 52/78]
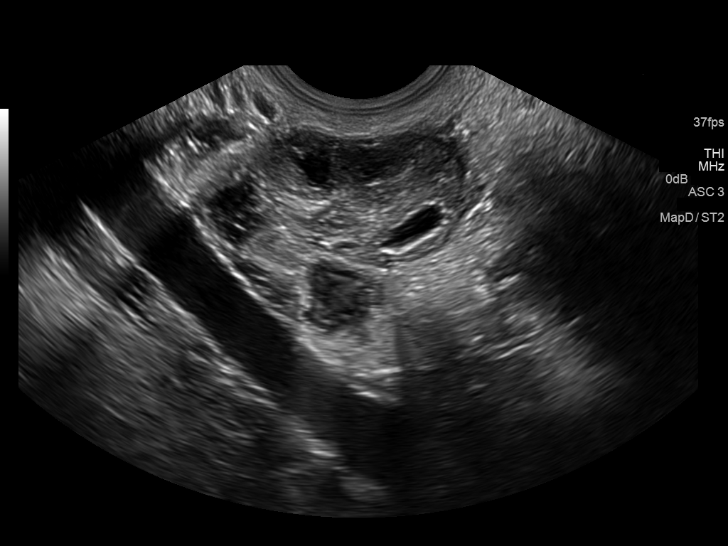
[im 58/78]
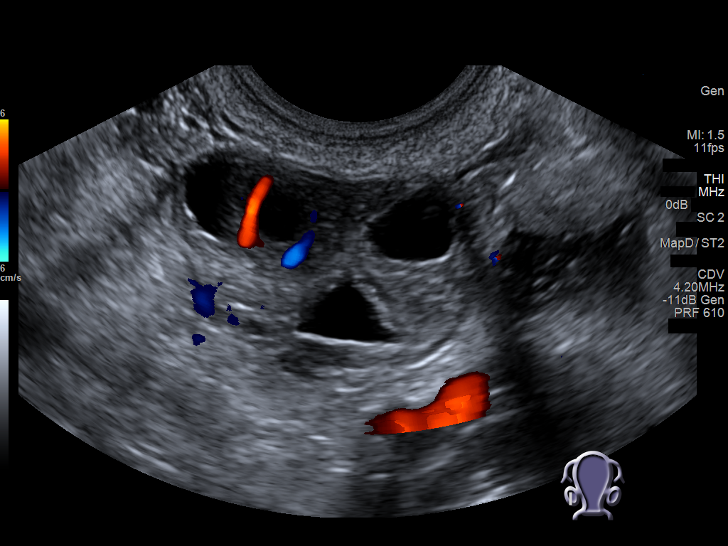
[im 65/78]
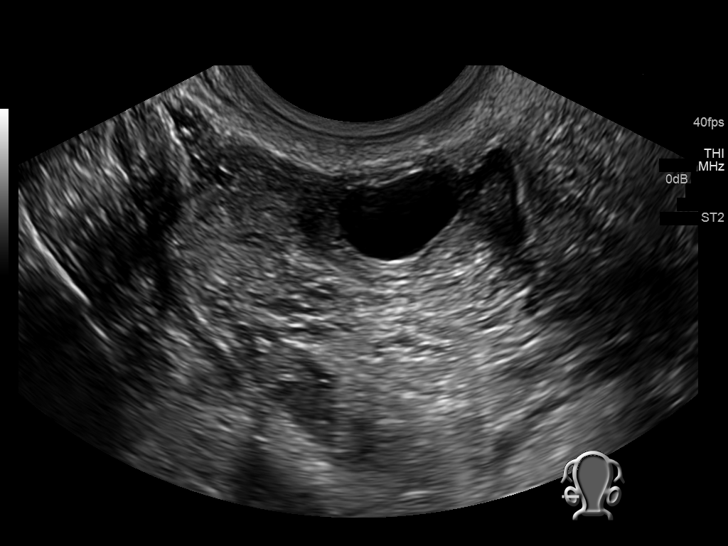
[im 71/78]
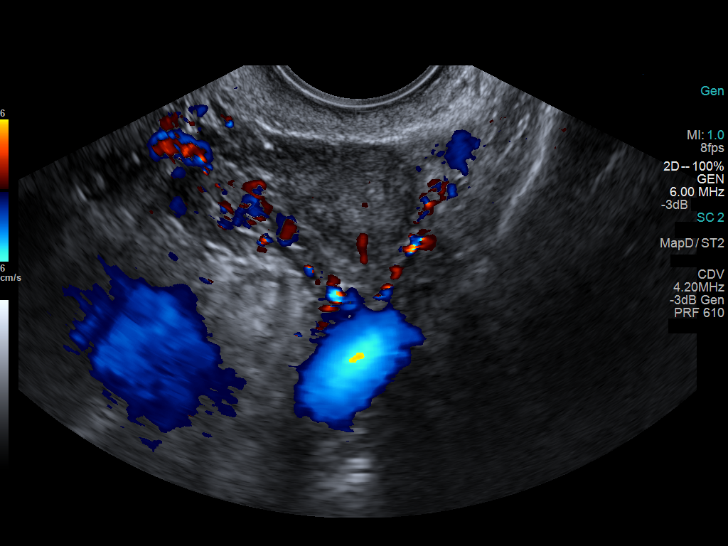
[im 78/78]
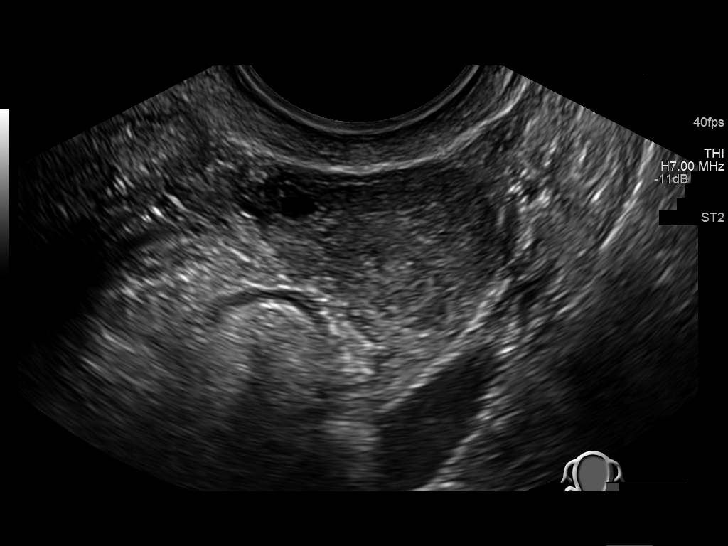

[14 of 25 positions shown; findings below may reference images not displayed]

FINDINGS: Uterus

Measurements: 6.9 x 2.9 x 4.1 cm. No fibroids or other mass
visualized.

Endometrium

Thickness: 7 mm.  No focal abnormality visualized.

Right ovary

Measurements: 3.1 x 1.7 x 3.4 cm. Normal appearance/no adnexal mass.

Left ovary

Measurements: 2.7 x 2.4 x 2.0 cm. Normal appearance/no adnexal mass.

Other findings

No free fluid.
IMPRESSION: Study within normal limits.

## 2017-05-20 DIAGNOSIS — F419 Anxiety disorder, unspecified: Secondary | ICD-10-CM | POA: Insufficient documentation

## 2018-02-11 ENCOUNTER — Other Ambulatory Visit: Payer: Self-pay

## 2018-02-11 ENCOUNTER — Observation Stay
Admission: EM | Admit: 2018-02-11 | Discharge: 2018-02-12 | Disposition: A | Discharge status: Home / Self Care | Payer: Medicaid Other | Attending: Internal Medicine | Admitting: Internal Medicine

## 2018-02-11 ENCOUNTER — Emergency Department
Admission: EM | Admit: 2018-02-11 | Discharge: 2018-02-11 | Disposition: A | Discharge status: Home / Self Care | Payer: Medicaid Other | Source: Home / Self Care | Attending: Emergency Medicine | Admitting: Emergency Medicine

## 2018-02-11 ENCOUNTER — Encounter: Payer: Self-pay | Admitting: Emergency Medicine

## 2018-02-11 DIAGNOSIS — F17210 Nicotine dependence, cigarettes, uncomplicated: Secondary | ICD-10-CM | POA: Insufficient documentation

## 2018-02-11 DIAGNOSIS — Z79899 Other long term (current) drug therapy: Secondary | ICD-10-CM | POA: Insufficient documentation

## 2018-02-11 DIAGNOSIS — R109 Unspecified abdominal pain: Secondary | ICD-10-CM | POA: Insufficient documentation

## 2018-02-11 DIAGNOSIS — D72829 Elevated white blood cell count, unspecified: Secondary | ICD-10-CM | POA: Diagnosis not present

## 2018-02-11 DIAGNOSIS — I10 Essential (primary) hypertension: Secondary | ICD-10-CM

## 2018-02-11 DIAGNOSIS — R112 Nausea with vomiting, unspecified: Secondary | ICD-10-CM

## 2018-02-11 DIAGNOSIS — K3189 Other diseases of stomach and duodenum: Secondary | ICD-10-CM | POA: Insufficient documentation

## 2018-02-11 LAB — CBC WITH DIFFERENTIAL/PLATELET
Abs Immature Granulocytes: 0.07 10*3/uL (ref 0.00–0.07)
Basophils Absolute: 0 10*3/uL (ref 0.0–0.1)
Basophils Relative: 0 %
EOS ABS: 0 10*3/uL (ref 0.0–0.5)
Eosinophils Relative: 0 %
HCT: 41.1 % (ref 36.0–46.0)
Hemoglobin: 13.6 g/dL (ref 12.0–15.0)
Immature Granulocytes: 0 %
Lymphocytes Relative: 7 %
Lymphs Abs: 1.1 10*3/uL (ref 0.7–4.0)
MCH: 31.1 pg (ref 26.0–34.0)
MCHC: 33.1 g/dL (ref 30.0–36.0)
MCV: 94.1 fL (ref 80.0–100.0)
MONO ABS: 0.3 10*3/uL (ref 0.1–1.0)
Monocytes Relative: 2 %
Neutro Abs: 15.3 10*3/uL — ABNORMAL HIGH (ref 1.7–7.7)
Neutrophils Relative %: 91 %
PLATELETS: 279 10*3/uL (ref 150–400)
RBC: 4.37 MIL/uL (ref 3.87–5.11)
RDW: 14.6 % (ref 11.5–15.5)
WBC: 16.8 10*3/uL — AB (ref 4.0–10.5)
nRBC: 0 % (ref 0.0–0.2)

## 2018-02-11 LAB — CBC
HCT: 46.2 % — ABNORMAL HIGH (ref 36.0–46.0)
Hemoglobin: 15.5 g/dL — ABNORMAL HIGH (ref 12.0–15.0)
MCH: 31.3 pg (ref 26.0–34.0)
MCHC: 33.5 g/dL (ref 30.0–36.0)
MCV: 93.1 fL (ref 80.0–100.0)
Platelets: 326 10*3/uL (ref 150–400)
RBC: 4.96 MIL/uL (ref 3.87–5.11)
RDW: 14.5 % (ref 11.5–15.5)
WBC: 13.8 10*3/uL — ABNORMAL HIGH (ref 4.0–10.5)
nRBC: 0 % (ref 0.0–0.2)

## 2018-02-11 LAB — URINALYSIS, COMPLETE (UACMP) WITH MICROSCOPIC
Bacteria, UA: NONE SEEN
Bilirubin Urine: NEGATIVE
Glucose, UA: NEGATIVE mg/dL
Ketones, ur: 80 mg/dL — AB
Leukocytes, UA: NEGATIVE
Nitrite: NEGATIVE
Protein, ur: NEGATIVE mg/dL
Specific Gravity, Urine: 1.021 (ref 1.005–1.030)
pH: 5 (ref 5.0–8.0)

## 2018-02-11 LAB — COMPREHENSIVE METABOLIC PANEL WITH GFR
ALT: 14 U/L (ref 0–44)
AST: 20 U/L (ref 15–41)
Albumin: 4.6 g/dL (ref 3.5–5.0)
Alkaline Phosphatase: 65 U/L (ref 38–126)
Anion gap: 10 (ref 5–15)
BUN: 12 mg/dL (ref 6–20)
CO2: 18 mmol/L — ABNORMAL LOW (ref 22–32)
Calcium: 8.9 mg/dL (ref 8.9–10.3)
Chloride: 111 mmol/L (ref 98–111)
Creatinine, Ser: 0.65 mg/dL (ref 0.44–1.00)
GFR calc Af Amer: >60 mL/min (ref >60)
GFR calc non Af Amer: >60 mL/min (ref >60)
Glucose, Bld: 110 mg/dL — ABNORMAL HIGH (ref 70–99)
Potassium: 3.9 mmol/L (ref 3.5–5.1)
Sodium: 139 mmol/L (ref 135–145)
Total Bilirubin: 0.8 mg/dL (ref 0.3–1.2)
Total Protein: 7.9 g/dL (ref 6.5–8.1)

## 2018-02-11 LAB — COMPREHENSIVE METABOLIC PANEL
ALT: 14 U/L (ref 0–44)
ANION GAP: 11 (ref 5–15)
AST: 22 U/L (ref 15–41)
Albumin: 4.8 g/dL (ref 3.5–5.0)
Alkaline Phosphatase: 68 U/L (ref 38–126)
BUN: 15 mg/dL (ref 6–20)
CHLORIDE: 112 mmol/L — AB (ref 98–111)
CO2: 16 mmol/L — ABNORMAL LOW (ref 22–32)
CREATININE: 0.85 mg/dL (ref 0.44–1.00)
Calcium: 9.5 mg/dL (ref 8.9–10.3)
Glucose, Bld: 130 mg/dL — ABNORMAL HIGH (ref 70–99)
Potassium: 3.9 mmol/L (ref 3.5–5.1)
SODIUM: 139 mmol/L (ref 135–145)
Total Bilirubin: 1.2 mg/dL (ref 0.3–1.2)
Total Protein: 8.6 g/dL — ABNORMAL HIGH (ref 6.5–8.1)

## 2018-02-11 LAB — LIPASE, BLOOD
LIPASE: 27 U/L (ref 11–51)
Lipase: 24 U/L (ref 11–51)

## 2018-02-11 LAB — POCT PREGNANCY, URINE: PREG TEST UR: NEGATIVE

## 2018-02-11 MED ORDER — SODIUM CHLORIDE 0.9 % IV SOLN
1000.0000 mL | Freq: Once | INTRAVENOUS | Status: AC
  Administered 2018-02-11: 1000 mL via INTRAVENOUS

## 2018-02-11 MED ORDER — SODIUM CHLORIDE 0.9 % IV BOLUS
1000.0000 mL | Freq: Once | INTRAVENOUS | Status: AC
  Administered 2018-02-11: 1000 mL via INTRAVENOUS

## 2018-02-11 MED ORDER — HALOPERIDOL LACTATE 5 MG/ML IJ SOLN
5.0000 mg | Freq: Once | INTRAMUSCULAR | Status: AC
Start: 2018-02-11 — End: 2018-02-11
  Administered 2018-02-11: 5 mg via INTRAVENOUS
  Filled 2018-02-11: qty 1

## 2018-02-11 MED ORDER — ONDANSETRON HCL 4 MG/2ML IJ SOLN
4.0000 mg | Freq: Once | INTRAMUSCULAR | Status: AC
  Administered 2018-02-11: 4 mg via INTRAVENOUS
  Filled 2018-02-11: qty 2

## 2018-02-11 MED ORDER — MORPHINE SULFATE (PF) 2 MG/ML IV SOLN
2.0000 mg | Freq: Once | INTRAVENOUS | Status: AC
  Administered 2018-02-11: 2 mg via INTRAVENOUS
  Filled 2018-02-11: qty 1

## 2018-02-11 MED ORDER — ONDANSETRON 4 MG PO TBDP: 4.0000 mg | ORAL_TABLET | Freq: Three times a day (TID) | ORAL | 0 refills | Status: DC | PRN

## 2018-02-11 MED ORDER — SODIUM CHLORIDE 0.9 % IV SOLN
8.0000 mg | Freq: Once | INTRAVENOUS | Status: DC
  Filled 2018-02-11: qty 4

## 2018-02-11 MED ORDER — PROCHLORPERAZINE EDISYLATE 10 MG/2ML IJ SOLN
10.0000 mg | Freq: Once | INTRAMUSCULAR | Status: AC
  Administered 2018-02-11: 10 mg via INTRAVENOUS
  Filled 2018-02-11: qty 2

## 2018-02-11 NOTE — ED Provider Notes (Signed)
[facility] Emergency Department Provider Note   ____________________________________________    I have reviewed the triage vital signs and the nursing notes.   HISTORY  Chief Complaint Emesis and Nausea     HPI Theresa Carpenter is a 37 y.o. female who presents with complaints of nausea and vomiting.  Patient reports she has a long history of cyclical vomiting syndrome, recent change in her medications noted, typically she would take Xanax if she started feeling nauseated but they switched her to hydroxyzine.  She reports her symptoms started last night and she did which she typically does which is to get into a hot bathtub and take her medications but unfortunately no relief.  She denies fevers or chills.  Does describe diffuse abdominal cramping which is typical for her.  No sick contacts.  No recent travel   Past Medical History:  Diagnosis Date  . Cyclic vomiting syndrome   . Family history of adverse reaction to anesthesia    BROTHER-N/V  . Hypertension   . Tachycardia     There are no active problems to display for this patient.   Past Surgical History:  Procedure Laterality Date  . CHOLECYSTECTOMY    . HEMORRHOID SURGERY N/A 01/27/2017   Procedure: HEMORRHOIDECTOMY;  Surgeon: Carolan Shiverintron-Diaz, Edgardo, MD;  Location: [facility] ORS;  Service: General;  Laterality: N/A;  . RECTAL EXAM UNDER ANESTHESIA N/A 01/27/2017   Procedure: RECTAL EXAM UNDER ANESTHESIA;  Surgeon: Carolan Shiverintron-Diaz, Edgardo, MD;  Location: [facility] ORS;  Service: General;  Laterality: N/A;    Prior to Admission medications   Medication Sig Start Date End Date Taking? Authorizing Provider  hydrOXYzine (ATARAX/VISTARIL) 25 MG tablet Take 25 mg by mouth 3 (three) times daily.   Yes [provider]  metoprolol succinate (TOPROL-XL) 50 MG 24 hr tablet Take 50 mg by mouth daily.  09/15/16  Yes [provider]  naproxen sodium (ALEVE) 220 MG tablet Take 220 mg by mouth.   Yes  [provider]  promethazine (PHENERGAN) 25 MG tablet Take 25 mg by mouth every 6 (six) hours as needed for nausea. 07/30/16  Yes [provider]  rizatriptan (MAXALT) 5 MG tablet Take 5 mg by mouth as needed (vomiting syndrome). May repeat in 2 hours if needed   Yes [provider]  topiramate (TOPAMAX) 25 MG tablet Take 25 mg by mouth 2 (two) times daily. 09/14/16  Yes [provider]  ALPRAZolam Prudy Feeler(XANAX) 0.5 MG tablet Take 0.5 mg by mouth 3 (three) times daily as needed (vomiting syndrome).  09/14/16   [provider]  ondansetron (ZOFRAN ODT) 4 MG disintegrating tablet Take 1 tablet (4 mg total) by mouth every 8 (eight) hours as needed for nausea or vomiting. 02/11/18   Jene EveryKinner, Denishia Citro, MD     Allergies Patient has no known allergies.  No family history on file.  Social History Social History   Tobacco Use  . Smoking status: Current Every Day Smoker    Packs/day: 0.30    Years: 10.00    Pack years: 3.00    Types: Cigarettes  . Smokeless tobacco: Never Used  Substance Use Topics  . Alcohol use: No  . Drug use: Yes    Frequency: 2.0 times per week    Types: Marijuana    Review of Systems  Constitutional: No fever/chills Eyes: No visual changes.  ENT: No sore throat. Cardiovascular: Denies chest pain. Respiratory: Denies shortness of breath. Gastrointestinal: As above Genitourinary: Negative for dysuria. Musculoskeletal: Negative  for back pain. Skin: Negative for rash. Neurological: Negative for headaches or weakness   ____________________________________________   PHYSICAL EXAM:  VITAL SIGNS: ED Triage Vitals  Enc Vitals Group     BP 02/11/18 0805 (!) 143/107     Pulse Rate 02/11/18 0805 (!) 117     Resp --      Temp 02/11/18 0805 99 F (37.2 C)     Temp Source 02/11/18 0805 Oral     SpO2 02/11/18 0805 100 %     Weight 02/11/18 0806 77.1 kg (170 lb)     Height 02/11/18 0806 1.6 m (5\' 3" )     Head Circumference --        Peak Flow --      Pain Score 02/11/18 0805 5     Pain Loc --      Pain Edu? --      Excl. in GC? --     Constitutional: Alert and oriented.  Eyes: Conjunctivae are normal.  Head: Atraumatic. Nose: No congestion/rhinnorhea. Mouth/Throat: Mucous membranes are moist.   Neck:  Painless ROM Cardiovascular: Normal rate, regular rhythm. Grossly normal heart sounds.  Good peripheral circulation. Respiratory: Normal respiratory effort.  No retractions. Lungs CTAB. Gastrointestinal: Soft and nontender. No distention.  No CVA tenderness.  Musculoskeletal: .  Warm and well perfused Neurologic:  Normal speech and language. No gross focal neurologic deficits are appreciated.  Skin:  Skin is warm, dry and intact. No rash noted. Psychiatric: Mood and affect are normal. Speech and behavior are normal.  ____________________________________________   LABS (all labs ordered are listed, but only abnormal results are displayed)  Labs Reviewed  CBC - Abnormal; Notable for the following components:      Result Value   WBC 13.8 (*)    Hemoglobin 15.5 (*)    HCT 46.2 (*)    All other components within normal limits  COMPREHENSIVE METABOLIC PANEL - Abnormal; Notable for the following components:   Chloride 112 (*)    CO2 16 (*)    Glucose, Bld 130 (*)    Total Protein 8.6 (*)    All other components within normal limits  LIPASE, BLOOD  POCT PREGNANCY, URINE   ____________________________________________  EKG  None ____________________________________________  RADIOLOGY  None ____________________________________________   PROCEDURES  Procedure(s) performed: No  Procedures   Critical Care performed: no ____________________________________________   INITIAL IMPRESSION / ASSESSMENT AND PLAN / ED COURSE  Pertinent labs & imaging results that were available during my care of the patient were reviewed by me and considered in my medical decision making (see chart for  details).  Patient presents with nausea and vomiting as above, appears consistent with her cyclical vomiting syndrome.  Will treat with IV Zofran 8 mg, IV fluids, check labs and reevaluate.   Patient continued to have vomiting and nausea after Zofran, gave IV Haldol and 50 cc normal saline and the patient felt much better.  She was able to tolerate p.o.'s, she states she is ready to go, will discharge  Patient apparently left without discharge instructions    ____________________________________________   FINAL CLINICAL IMPRESSION(S) / ED DIAGNOSES  Final diagnoses:  Non-intractable vomiting with nausea, unspecified vomiting type        Note:  This document was prepared using Dragon voice recognition software and may include unintentional dictation errors.    Jene EveryKinner, Onelia Cadmus, MD 02/11/18 [phone]

## 2018-02-11 NOTE — ED Notes (Signed)
EKG completed for weakness.

## 2018-02-11 NOTE — ED Notes (Signed)
Pt updated. Sitting calmly in bed.

## 2018-02-11 NOTE — ED Notes (Signed)
Pt up to toilet attempting for urine sample.

## 2018-02-11 NOTE — ED Notes (Signed)
Blood drawn per IV team.

## 2018-02-11 NOTE — ED Provider Notes (Addendum)
[facility] Emergency Department Provider Note   ____________________________________________   First MD Initiated Contact with Patient 02/11/18 1623     (approximate)  I have reviewed the triage vital signs and the nursing notes.   HISTORY  Chief Complaint Emesis    HPI [name] is a 37 y.o. female patient was seen earlier today got Haldol for nausea and discharged.  She was doing well but when she got home immediately on getting out of the car she began having cramps and she began throwing up again is that she got in the house.  She is complaining of cramps and vomiting since then.  No diarrhea.  She has had this several times in the past.  No fever no blood in the vomit   Past Medical History:  Diagnosis Date  . Cyclic vomiting syndrome   . Family history of adverse reaction to anesthesia    BROTHER-N/V  . Hypertension   . Tachycardia     There are no active problems to display for this patient.   Past Surgical History:  Procedure Laterality Date  . CHOLECYSTECTOMY    . HEMORRHOID SURGERY N/A 01/27/2017   Procedure: HEMORRHOIDECTOMY;  Surgeon: Carolan Shiverintron-Diaz, Edgardo, MD;  Location: [facility] ORS;  Service: General;  Laterality: N/A;  . RECTAL EXAM UNDER ANESTHESIA N/A 01/27/2017   Procedure: RECTAL EXAM UNDER ANESTHESIA;  Surgeon: Carolan Shiverintron-Diaz, Edgardo, MD;  Location: [facility] ORS;  Service: General;  Laterality: N/A;    Prior to Admission medications   Medication Sig Start Date End Date Taking? Authorizing Provider  ALPRAZolam Prudy Feeler(XANAX) 0.5 MG tablet Take 0.5 mg by mouth 3 (three) times daily as needed (vomiting syndrome).  09/14/16   [provider]  hydrOXYzine (ATARAX/VISTARIL) 25 MG tablet Take 25 mg by mouth 3 (three) times daily.    [provider]  metoprolol succinate (TOPROL-XL) 50 MG 24 hr tablet Take 50 mg by mouth daily.  09/15/16   [provider]  naproxen sodium (ALEVE) 220 MG tablet Take 220 mg by mouth.     [provider]  ondansetron (ZOFRAN ODT) 4 MG disintegrating tablet Take 1 tablet (4 mg total) by mouth every 8 (eight) hours as needed for nausea or vomiting. 02/11/18   Jene EveryKinner, Robert, MD  promethazine (PHENERGAN) 25 MG tablet Take 25 mg by mouth every 6 (six) hours as needed for nausea. 07/30/16   [provider]  rizatriptan (MAXALT) 5 MG tablet Take 5 mg by mouth as needed (vomiting syndrome). May repeat in 2 hours if needed    [provider]  topiramate (TOPAMAX) 25 MG tablet Take 25 mg by mouth 2 (two) times daily. 09/14/16   [provider]    Allergies Patient has no known allergies.  History reviewed. No pertinent family history.  Social History Social History   Tobacco Use  . Smoking status: Current Every Day Smoker    Packs/day: 0.30    Years: 10.00    Pack years: 3.00    Types: Cigarettes  . Smokeless tobacco: Never Used  Substance Use Topics  . Alcohol use: No  . Drug use: Yes    Frequency: 2.0 times per week    Types: Marijuana    Review of Systems  Constitutional: No fever/chills Eyes: No visual changes. ENT: No sore throat. Cardiovascular: Denies chest pain. Respiratory: Denies shortness of breath. Gastrointestinal: Crampy abdominal pain.   nausea,  vomiting.  No diarrhea.  No constipation. Genitourinary: Negative for dysuria. Musculoskeletal: Negative for back  pain. Skin: Negative for rash. Neurological: Negative for headaches, focal weakness   ____________________________________________   PHYSICAL EXAM:  VITAL SIGNS: ED Triage Vitals  Enc Vitals Group     BP 02/11/18 1631 (!) 165/119     Pulse Rate 02/11/18 1631 (!) 110     Resp 02/11/18 1631 17     Temp 02/11/18 1631 98.3 F (36.8 C)     Temp Source 02/11/18 1631 Oral     SpO2 02/11/18 1631 99 %     Weight 02/11/18 1632 170 lb (77.1 kg)     Height 02/11/18 1632 5\' 3"  (1.6 m)     Head Circumference --      Peak Flow --      Pain Score 02/11/18 1632 9       Pain Loc --      Pain Edu? --      Excl. in GC? --     Constitutional: Alert and oriented.  Looks uncomfortable Eyes: Conjunctivae are normal.  Head: Atraumatic. Nose: No congestion/rhinnorhea. Mouth/Throat: Mucous membranes are moist.  Oropharynx non-erythematous. Neck: No stridor. Cardiovascular: Rapid rate, regular rhythm. Grossly normal heart sounds.  Good peripheral circulation. Respiratory: Normal respiratory effort.  No retractions. Lungs CTAB. Gastrointestinal: Soft mildly diffusely tender. No distention. No abdominal bruits. No CVA tenderness. Musculoskeletal: No lower extremity tenderness nor edema.   Neurologic:  Normal speech and language. No gross focal neurologic deficits are appreciated.. Skin:  Skin is warm, dry and intact. No rash noted. Psychiatric: Mood and affect are normal. Speech and behavior are normal.  ____________________________________________   LABS (all labs ordered are listed, but only abnormal results are displayed)  Labs Reviewed  CBC WITH DIFFERENTIAL/PLATELET - Abnormal; Notable for the following components:      Result Value   WBC 16.8 (*)    Neutro Abs 15.3 (*)    All other components within normal limits  URINALYSIS, COMPLETE (UACMP) WITH MICROSCOPIC - Abnormal; Notable for the following components:   Color, Urine YELLOW (*)    APPearance CLEAR (*)    Hgb urine dipstick MODERATE (*)    Ketones, ur 80 (*)    All other components within normal limits  COMPREHENSIVE METABOLIC PANEL - Abnormal; Notable for the following components:   CO2 18 (*)    Glucose, Bld 110 (*)    All other components within normal limits  LIPASE, BLOOD   ____________________________________________  EKG EKG read interpreted by me shows sinus tachycardia rate of 104 normal axis no acute ST-T wave changes  ____________________________________________  RADIOLOGY  ED MD interpretation:   Official radiology report(s): No results  found.  ____________________________________________   PROCEDURES  Procedure(s) performed:  Procedures  Critical Care performed:   ____________________________________________   INITIAL IMPRESSION / ASSESSMENT AND PLAN / ED COURSE  Patient has had 4 abdominal CT scans in the last few years.    ----------------------------------------- 7:33 PM on 02/11/2018 -----------------------------------------  Patient now feeling better.  Give her some more fluids that she is still tachycardic.  ----------------------------------------- 12:04 AM on 02/12/2018 -----------------------------------------  Patient is now worse and vomiting again.  We will give her some more Zofran and plan on admitting her. ____________________________________________   FINAL CLINICAL IMPRESSION(S) / ED DIAGNOSES  Final diagnoses:  Intractable vomiting with nausea, unspecified vomiting type     ED Discharge Orders    None       Note:  This document was prepared using Dragon voice recognition software and may include unintentional dictation errors.  Arnaldo NatalMalinda, Tanya Marvin F, MD 02/12/18 Salley [id number]    Arnaldo NatalMalinda, Arneta Mahmood F, MD 02/12/18 Burna [id number]

## 2018-02-11 NOTE — ED Notes (Signed)
Patient placed on cardiac monitor prior to starting Haldol infusion

## 2018-02-11 NOTE — ED Notes (Signed)
Pt resting. Requesting update on if d/c or admitting.

## 2018-02-11 NOTE — ED Notes (Signed)
Called and left message updating patient on discharge instructions.

## 2018-02-11 NOTE — ED Notes (Signed)
Pt given warm blankets. IV team at bedside.

## 2018-02-11 NOTE — ED Triage Notes (Signed)
Pt in via EMS from home for acyclic vomiting syndrome. Was d/c from ED at noon today. States the vomiting hasn't gotten any better.  Hist HTN, depression, anxiety.

## 2018-02-11 NOTE — ED Notes (Signed)
Pt denies needs. Lights turned down per pt request.

## 2018-02-11 NOTE — ED Notes (Signed)
2nd lav/grn tops sent to lab.

## 2018-02-11 NOTE — ED Triage Notes (Signed)
Patient from home via [facility]. Reports history of cyclic vomiting syndrome. States her doctors have recently adjusted her medications. Last night around 8 pm she started vomiting and vomited throughout the night. Patient ambulatory to room with NAD noted.

## 2018-02-11 NOTE — ED Notes (Signed)
Attempted for IV. Pt states she is a hard stick and usually has to be stuck in neck/foot.

## 2018-02-11 NOTE — ED Notes (Signed)
Pt up to bedside toilet.  

## 2018-02-11 NOTE — ED Notes (Signed)
Went to give patient her discharge instructions and pt had already left. Will attempt to call patient.

## 2018-02-11 NOTE — ED Notes (Signed)
Patient unhooked from pulse ox to use restroom. Ambulatory with steady gait.

## 2018-02-12 ENCOUNTER — Other Ambulatory Visit: Payer: Self-pay

## 2018-02-12 ENCOUNTER — Emergency Department: Payer: Medicaid Other

## 2018-02-12 DIAGNOSIS — R112 Nausea with vomiting, unspecified: Secondary | ICD-10-CM | POA: Diagnosis present

## 2018-02-12 LAB — BASIC METABOLIC PANEL
Anion gap: 9 (ref 5–15)
BUN: 10 mg/dL (ref 6–20)
CHLORIDE: 110 mmol/L (ref 98–111)
CO2: 20 mmol/L — ABNORMAL LOW (ref 22–32)
Calcium: 9 mg/dL (ref 8.9–10.3)
Creatinine, Ser: 0.57 mg/dL (ref 0.44–1.00)
GFR calc Af Amer: >60 mL/min (ref >60)
GFR calc non Af Amer: >60 mL/min (ref >60)
GLUCOSE: 90 mg/dL (ref 70–99)
Potassium: 3.4 mmol/L — ABNORMAL LOW (ref 3.5–5.1)
Sodium: 139 mmol/L (ref 135–145)

## 2018-02-12 LAB — GLUCOSE, CAPILLARY: Glucose-Capillary: 88 mg/dL (ref 70–99)

## 2018-02-12 LAB — CBC
HCT: 41.4 % (ref 36.0–46.0)
Hemoglobin: 13.8 g/dL (ref 12.0–15.0)
MCH: 31.2 pg (ref 26.0–34.0)
MCHC: 33.3 g/dL (ref 30.0–36.0)
MCV: 93.5 fL (ref 80.0–100.0)
Platelets: 289 10*3/uL (ref 150–400)
RBC: 4.43 MIL/uL (ref 3.87–5.11)
RDW: 14.6 % (ref 11.5–15.5)
WBC: 17.5 10*3/uL — ABNORMAL HIGH (ref 4.0–10.5)
nRBC: 0 % (ref 0.0–0.2)

## 2018-02-12 MED ORDER — ONDANSETRON HCL 4 MG/2ML IJ SOLN
4.0000 mg | Freq: Once | INTRAMUSCULAR | Status: AC
  Administered 2018-02-12: 4 mg via INTRAVENOUS
  Filled 2018-02-12: qty 2

## 2018-02-12 MED ORDER — SODIUM CHLORIDE 0.9 % IV SOLN
INTRAVENOUS | Status: DC
  Administered 2018-02-12: 05:00:00 via INTRAVENOUS

## 2018-02-12 MED ORDER — ACETAMINOPHEN 650 MG RE SUPP: 650.0000 mg | Freq: Four times a day (QID) | RECTAL | Status: DC | PRN

## 2018-02-12 MED ORDER — METOPROLOL SUCCINATE ER 50 MG PO TB24
50.0000 mg | ORAL_TABLET | Freq: Every day | ORAL | Status: DC
  Administered 2018-02-12: 50 mg via ORAL
  Filled 2018-02-12: qty 1

## 2018-02-12 MED ORDER — ONDANSETRON HCL 4 MG/2ML IJ SOLN
4.0000 mg | Freq: Four times a day (QID) | INTRAMUSCULAR | Status: DC | PRN
  Administered 2018-02-12: 4 mg via INTRAVENOUS
  Filled 2018-02-12: qty 2

## 2018-02-12 MED ORDER — ONDANSETRON HCL 4 MG PO TABS: 4.0000 mg | ORAL_TABLET | Freq: Four times a day (QID) | ORAL | Status: DC | PRN

## 2018-02-12 MED ORDER — PANTOPRAZOLE SODIUM 40 MG IV SOLR
40.0000 mg | INTRAVENOUS | Status: DC
  Administered 2018-02-12: 40 mg via INTRAVENOUS
  Filled 2018-02-12: qty 40

## 2018-02-12 MED ORDER — DOCUSATE SODIUM 100 MG PO CAPS: 100.0000 mg | ORAL_CAPSULE | Freq: Two times a day (BID) | ORAL | Status: DC

## 2018-02-12 MED ORDER — TOPIRAMATE 25 MG PO TABS
25.0000 mg | ORAL_TABLET | Freq: Two times a day (BID) | ORAL | Status: DC
  Filled 2018-02-12 (×3): qty 1

## 2018-02-12 MED ORDER — ALPRAZOLAM 0.5 MG PO TABS: 0.5000 mg | ORAL_TABLET | Freq: Three times a day (TID) | ORAL | Status: DC | PRN

## 2018-02-12 MED ORDER — PROMETHAZINE HCL 25 MG PO TABS
25.0000 mg | ORAL_TABLET | Freq: Four times a day (QID) | ORAL | Status: DC | PRN
  Filled 2018-02-12: qty 1

## 2018-02-12 MED ORDER — ACETAMINOPHEN 325 MG PO TABS: 650.0000 mg | ORAL_TABLET | Freq: Four times a day (QID) | ORAL | Status: DC | PRN

## 2018-02-12 MED ORDER — HYDROXYZINE HCL 25 MG PO TABS
25.0000 mg | ORAL_TABLET | Freq: Three times a day (TID) | ORAL | Status: DC
  Administered 2018-02-12: 25 mg via ORAL
  Filled 2018-02-12 (×4): qty 1

## 2018-02-12 NOTE — ED Notes (Signed)
Radiology called as pt already had urine preg test today.

## 2018-02-12 NOTE — H&P (Signed)
[facility] [place] [facility]   PATIENT NAME: Theresa Carpenter    MR#:  [id number]  DATE OF BIRTH:  06/09/1980  DATE OF ADMISSION:  02/11/2018  PRIMARY CARE PHYSICIAN: [facility]   REQUESTING/REFERRING PHYSICIAN: Emergency room physician  CHIEF COMPLAINT:   Chief Complaint  Patient presents with  . Emesis    HISTORY OF PRESENT ILLNESS:  Theresa Carpenter  is a 37 y.o. female with a known history listed below presented to emergency room for evaluation of nausea and vomiting.  Patient has history of cyclic vomiting syndrome.  Patient was seen earlier in emergency room and discharged home with improvement.  Patient went home and started having nausea vomiting and abdominal cramps.  Patient presented to emergency room again.  Patient received initial treatment in emergency room with improvement.  Currently patient denies any nausea or vomiting or abdominal pain.  In emergency room patient had evaluation that showed elevation of WBC.  No fever or chills.  No diarrhea.  No blood in vomit.  No other symptoms.  Hospitalist team requested for observation.  PAST MEDICAL HISTORY:   Past Medical History:  Diagnosis Date  . Cyclic vomiting syndrome   . Family history of adverse reaction to anesthesia    BROTHER-N/V  . Hypertension   . Tachycardia     PAST SURGICAL HISTORY:   Past Surgical History:  Procedure Laterality Date  . CHOLECYSTECTOMY    . HEMORRHOID SURGERY N/A 01/27/2017   Procedure: HEMORRHOIDECTOMY;  Surgeon: Carolan Shiverintron-Diaz, Edgardo, MD;  Location: [facility] ORS;  Service: General;  Laterality: N/A;  . RECTAL EXAM UNDER ANESTHESIA N/A 01/27/2017   Procedure: RECTAL EXAM UNDER ANESTHESIA;  Surgeon: Carolan Shiverintron-Diaz, Edgardo, MD;  Location: [facility] ORS;  Service: General;  Laterality: N/A;    SOCIAL HISTORY:   Social History   Tobacco Use  . Smoking status: Current Every Day Smoker    Packs/day: 0.30    Years: 10.00    Pack years: 3.00    Types:  Cigarettes  . Smokeless tobacco: Never Used  Substance Use Topics  . Alcohol use: No    FAMILY HISTORY:  History reviewed. No pertinent family history.  DRUG ALLERGIES:  No Known Allergies  REVIEW OF SYSTEMS:   ROS -12 point review of system reviewed positive as per HPI otherwise negative  MEDICATIONS AT HOME:   Prior to Admission medications   Medication Sig Start Date End Date Taking? Authorizing Provider  hydrOXYzine (ATARAX/VISTARIL) 25 MG tablet Take 25 mg by mouth 3 (three) times daily.   Yes [provider]  metoprolol succinate (TOPROL-XL) 50 MG 24 hr tablet Take 50 mg by mouth daily.  09/15/16  Yes [provider]  naproxen sodium (ALEVE) 220 MG tablet Take 220 mg by mouth.   Yes [provider]  promethazine (PHENERGAN) 25 MG tablet Take 25 mg by mouth every 6 (six) hours as needed for nausea. 07/30/16  Yes [provider]  rizatriptan (MAXALT) 5 MG tablet Take 5 mg by mouth as needed (vomiting syndrome). May repeat in 2 hours if needed   Yes [provider]  topiramate (TOPAMAX) 25 MG tablet Take 25 mg by mouth 2 (two) times daily. 09/14/16  Yes [provider]  ALPRAZolam Prudy Feeler(XANAX) 0.5 MG tablet Take 0.5 mg by mouth 3 (three) times daily as needed (vomiting syndrome).  09/14/16   [provider]  ondansetron (ZOFRAN ODT) 4 MG disintegrating tablet Take 1 tablet (4 mg total) by mouth every 8 (eight)  hours as needed for nausea or vomiting. 02/11/18   Jene EveryKinner, Robert, MD      VITAL SIGNS:  Blood pressure 117/80, pulse 99, temperature 98.3 F (36.8 C), temperature source Oral, resp. rate (!) 24, height 5\' 3"  (1.6 m), weight 77.1 kg, SpO2 100 %.  PHYSICAL EXAMINATION:  Physical Exam  GENERAL:  37 y.o.-year-old patient lying in the bed with no acute distress.  EYES: Pupils equal, round, reactive to light and accommodation. No scleral icterus. Extraocular muscles intact.  HEENT: Head atraumatic, normocephalic.  Oropharynx and nasopharynx clear.  NECK:  Supple, no jugular venous distention. No thyroid enlargement, no tenderness.  LUNGS: Normal breath sounds bilaterally, no wheezing, rales,rhonchi or crepitation. No use of accessory muscles of respiration.  CARDIOVASCULAR: S1, S2 normal. No murmurs, rubs, or gallops.  ABDOMEN: Soft, nontender, nondistended. Bowel sounds present. No organomegaly or mass.  EXTREMITIES: No pedal edema, cyanosis, or clubbing.  NEUROLOGIC: Cranial nerves II through XII are intact. Muscle strength 5/5 in all extremities. Sensation intact. Gait not checked.  PSYCHIATRIC: The patient is alert and oriented x 3.  SKIN: No obvious rash, lesion, or ulcer.   LABORATORY PANEL:   CBC Recent Labs  Lab 02/11/18 1748  WBC 16.8*  HGB 13.6  HCT 41.1  PLT 279   ------------------------------------------------------------------------------------------------------------------  Chemistries  Recent Labs  Lab 02/11/18 1830  NA 139  K 3.9  CL 111  CO2 18*  GLUCOSE 110*  BUN 12  CREATININE 0.65  CALCIUM 8.9  AST 20  ALT 14  ALKPHOS 65  BILITOT 0.8   ------------------------------------------------------------------------------------------------------------------  Cardiac Enzymes No results for input(s): TROPONINI in the last 168 hours. ------------------------------------------------------------------------------------------------------------------  RADIOLOGY:  Dg Abdomen Acute W/chest  Result Date: 02/12/2018 CLINICAL DATA:  Acute exacerbation of cyclic vomiting syndrome. EXAM: DG ABDOMEN ACUTE W/ 1V CHEST COMPARISON:  None. FINDINGS: Cardiomediastinal silhouette is normal. Lungs are clear, no pleural effusions. No pneumothorax. Soft tissue planes and included osseous structures are unremarkable. Bowel gas pattern is nondilated and nonobstructive. Surgical clips in the included right abdomen compatible with cholecystectomy. Phleboliths project in the pelvis. No  intra-abdominal mass effect, pathologic calcifications or free air. Soft tissue planes and included osseous structures are non-suspicious. RIGHT gluteal injection granuloma. IMPRESSION: Normal chest radiograph.  Normal bowel gas pattern. Electronically Signed   By: Awilda Metroourtnay  Bloomer M.D.   On: 02/12/2018 01:21      IMPRESSION AND PLAN:   1.  Nausea and vomiting in a patient with cyclic vomiting syndrome: Currently improved with initial treatment in emergency room.  Continue home medications.  Add IV Zofran PRN.  Adjust medication as indicated.  IV hydration.  Clear liquid diet.  Advance as tolerated.  Protonix.  2.  Leukocytosis: Likely related to stress and dehydration.  IV hydration.  Monitor for signs of infection.  3.  Chronic other medical problems: Monitor.  Continue home medication as ordered.  DVT prophylaxis: SCD  Estimated length of stay less than 2 midnight  Moderate risk secondary to above   All the records are reviewed and case discussed with ED provider. Management plans discussed with the patient, family and they are in agreement.  CODE STATUS: Full  TOTAL TIME TAKING CARE OF THIS PATIENT: 30 minutes.    Montez MoritaJitendra Beulah Matusek M.D on 02/12/2018 at 2:28 AM  Between 7am to 6pm - Pager - [phone]  After 6pm go to [email or web address] - Scientist, research (life [email or web address])password [email or web address]  [facility] [place] [facility]  Office  [phone]  CC: Primary care physician; [facility]

## 2018-02-12 NOTE — Plan of Care (Signed)
?  Problem: Clinical Measurements: ?Goal: Ability to maintain clinical measurements within normal limits will improve ?Outcome: Progressing ?Goal: Will remain free from infection ?Outcome: Progressing ?  ?Problem: Nutrition: ?Goal: Adequate nutrition will be maintained ?Outcome: Progressing ?  ?Problem: Coping: ?Goal: Level of anxiety will decrease ?Outcome: Progressing ?  ?

## 2018-02-12 NOTE — Progress Notes (Signed)
[facility] - [place] [facility]   PATIENT NAME: Theresa Carpenter    MR#:  [id number]  DATE OF BIRTH:  Jul 14, 1980  SUBJECTIVE:   Some nausea and gagging Tolerating po fluids REVIEW OF SYSTEMS:   Review of Systems  Constitutional: Negative for chills, fever and weight loss.  HENT: Negative for ear discharge, ear pain and nosebleeds.   Eyes: Negative for blurred vision, pain and discharge.  Respiratory: Negative for sputum production, shortness of breath, wheezing and stridor.   Cardiovascular: Negative for chest pain, palpitations, orthopnea and PND.  Gastrointestinal: Positive for nausea. Negative for abdominal pain, diarrhea and vomiting.  Genitourinary: Negative for frequency and urgency.  Musculoskeletal: Negative for back pain and joint pain.  Neurological: Negative for sensory change, speech change, focal weakness and weakness.  Psychiatric/Behavioral: Negative for depression and hallucinations. The patient is not nervous/anxious.    Tolerating Diet:CLD Tolerating PT: ambulatory  DRUG ALLERGIES:  No Known Allergies  VITALS:  Blood pressure 114/80, pulse 93, temperature 98.6 F (37 C), temperature source Oral, resp. rate 14, height 5\' 3"  (1.6 m), weight 77.1 kg, SpO2 100 %.  PHYSICAL EXAMINATION:   Physical Exam  GENERAL:  37 y.o.-year-old patient lying in the bed with no acute distress.  EYES: Pupils equal, round, reactive to light and accommodation. No scleral icterus. Extraocular muscles intact.  HEENT: Head atraumatic, normocephalic. Oropharynx and nasopharynx clear.  NECK:  Supple, no jugular venous distention. No thyroid enlargement, no tenderness.  LUNGS: Normal breath sounds bilaterally, no wheezing, rales, rhonchi. No use of accessory muscles of respiration.  CARDIOVASCULAR: S1, S2 normal. No murmurs, rubs, or gallops.  ABDOMEN: Soft, nontender, nondistended. Bowel sounds present. No organomegaly or mass.  EXTREMITIES: No cyanosis,  clubbing or edema b/l.    NEUROLOGIC: Cranial nerves II through XII are intact. No focal Motor or sensory deficits b/l.   PSYCHIATRIC:  patient is alert and oriented x 3.  SKIN: No obvious rash, lesion, or ulcer.   LABORATORY PANEL:  CBC Recent Labs  Lab 02/12/18 0450  WBC 17.5*  HGB 13.8  HCT 41.4  PLT 289    Chemistries  Recent Labs  Lab 02/11/18 1830 02/12/18 0450  NA 139 139  K 3.9 3.4*  CL 111 110  CO2 18* 20*  GLUCOSE 110* 90  BUN 12 10  CREATININE 0.65 0.57  CALCIUM 8.9 9.0  AST 20  --   ALT 14  --   ALKPHOS 65  --   BILITOT 0.8  --    Cardiac Enzymes No results for input(s): TROPONINI in the last 168 hours. RADIOLOGY:  Dg Abdomen Acute W/chest  Result Date: 02/12/2018 CLINICAL DATA:  Acute exacerbation of cyclic vomiting syndrome. EXAM: DG ABDOMEN ACUTE W/ 1V CHEST COMPARISON:  None. FINDINGS: Cardiomediastinal silhouette is normal. Lungs are clear, no pleural effusions. No pneumothorax. Soft tissue planes and included osseous structures are unremarkable. Bowel gas pattern is nondilated and nonobstructive. Surgical clips in the included right abdomen compatible with cholecystectomy. Phleboliths project in the pelvis. No intra-abdominal mass effect, pathologic calcifications or free air. Soft tissue planes and included osseous structures are non-suspicious. RIGHT gluteal injection granuloma. IMPRESSION: Normal chest radiograph.  Normal bowel gas pattern. Electronically Signed   By: Awilda Metroourtnay  Bloomer M.D.   On: 02/12/2018 01:21   ASSESSMENT AND PLAN:  Theresa HazelSheena Knodel  is a 37 y.o. female with a known history listed below presented to emergency room for evaluation of nausea and vomiting.  Patient has history of cyclic  vomiting syndrome.  Patient was seen earlier in emergency room and discharged home with improvement.  Patient went home and started having nausea vomiting and abdominal cramps.  Patient presented to emergency room again.  1. Nausea and vomiting in a  patient with suspected  cyclic vomiting syndrome:  -Currently improved with initial treatment in emergency room.  - Continue home medications.  Add IV Zofran PRN.  Adjust medication as indicated.  IV hydration.  -Clear liquid diet.  Advance as tolerated.  Protonix.  2.  Leukocytosis: Likely related to stress and dehydration.  IV hydration.  Monitor for signs of infection.  3.  Chronic other medical problems: Monitor.  Continue home medication as ordered.  4.DVT prophylaxis: SCD  if continues to tolerate po diet will consider d/c home later today  CODE STATUS: full  DVT Prophylaxis: ambuatory  TOTAL TIME TAKING CARE OF THIS PATIENT: *30* minutes.  >50% time spent on counselling and coordination of care  POSSIBLE D/C IN *1* DAYS, DEPENDING ON CLINICAL CONDITION.  Note: This dictation was prepared with Dragon dictation along with smaller phrase technology. Any transcriptional errors that result from this process are unintentional.  Enedina FinnerSona Keyontay Stolz M.D on 02/12/2018 at 1:30 PM  Between 7am to 6pm - Pager - [phone]  After 6pm go to [email or web address] - password [email or web address]  [facility] [place] [facility]  Office  [phone]  CC: Primary care physician; [facility] [place] PrimaryPatient ID: Su MonksSheena V Beaudry, female   DOB: [birth year], 37 y.o.   MRN: [id number]

## 2018-02-12 NOTE — Discharge Summary (Signed)
[facility] -  at [facility]   PATIENT NAME: Theresa Carpenter    MR#:  [id number]  DATE OF BIRTH:  Apr 26, 1980  DATE OF ADMISSION:  02/11/2018 ADMITTING PHYSICIAN: Montez MoritaJitendra Fey Coghill, MD  DATE OF DISCHARGE: 02/12/2018  PRIMARY CARE PHYSICIAN: [facility]    ADMISSION DIAGNOSIS:  Intractable vomiting with nausea, unspecified vomiting type [R11.2]  DISCHARGE DIAGNOSIS:  Intractable nausea/vomiting/cyclical vomiting syndrome  SECONDARY DIAGNOSIS:   Past Medical History:  Diagnosis Date  . Cyclic vomiting syndrome   . Family history of adverse reaction to anesthesia    BROTHER-N/V  . Hypertension   . Tachycardia     HOSPITAL COURSE:   SheenaGravesis a37 y.o.femalewith a known history listed below presented to emergency room for evaluation of nausea and vomiting. Patient has history of cyclic vomiting syndrome. Patient was seen earlier in emergency room and discharged home with improvement. Patient went home and started having nausea vomiting and abdominal cramps. Patient presented to emergency room again.  1.Nausea and vomiting in a patient with suspected  cyclic vomiting syndrome: -Currently improved with initial treatment in emergency room.  -Continue home medications. Add IV Zofran PRN. Adjust medication as indicated. IV hydration. -Clear liquid diet. Advance as tolerated--did ok with soft diet. [facility] and nothing acute found int he past  2.Leukocytosis:Likely related to stress and dehydration.IV hydration. Monitor for signs of infection.  3.Chronic other medical problems:Monitor. Continue home medication as ordered.  4.DVT prophylaxis:SCD  Overall ok  D/c home CONSULTS OBTAINED:    DRUG ALLERGIES:  No Known Allergies  DISCHARGE MEDICATIONS:   Allergies as of 02/12/2018   No Known Allergies     Medication List    STOP taking these medications    ALPRAZolam 0.5 MG tablet Commonly known as:  XANAX     TAKE these medications   hydrOXYzine 25 MG tablet Commonly known as:  ATARAX/VISTARIL Take 25 mg by mouth 3 (three) times daily.   MAXALT 5 MG tablet Generic drug:  rizatriptan Take 5 mg by mouth as needed (vomiting syndrome). May repeat in 2 hours if needed   metoprolol succinate 50 MG 24 hr tablet Commonly known as:  TOPROL-XL Take 50 mg by mouth daily.   naproxen sodium 220 MG tablet Commonly known as:  ALEVE Take 220 mg by mouth.   ondansetron 4 MG disintegrating tablet Commonly known as:  ZOFRAN ODT Take 1 tablet (4 mg total) by mouth every 8 (eight) hours as needed for nausea or vomiting.   promethazine 25 MG tablet Commonly known as:  PHENERGAN Take 25 mg by mouth every 6 (six) hours as needed for nausea.   topiramate 25 MG tablet Commonly known as:  TOPAMAX Take 25 mg by mouth 2 (two) times daily.       If you experience worsening of your admission symptoms, develop shortness of breath, life threatening emergency, suicidal or homicidal thoughts you must seek medical attention immediately by calling 911 or calling your MD immediately  if symptoms less severe.  You Must read complete instructions/literature along with all the possible adverse reactions/side effects for all the Medicines you take and that have been prescribed to you. Take any new Medicines after you have completely understood and accept all the possible adverse reactions/side effects.   Please note  You were cared for by a hospitalist during your hospital stay. If you have any questions about your discharge medications or the care you received while you  were in the hospital after you are discharged, you can call the unit and asked to speak with the hospitalist on call if the hospitalist that took care of you is not available. Once you are discharged, your primary care physician will handle any further medical issues. Please note that NO REFILLS  for any discharge medications will be authorized once you are discharged, as it is imperative that you return to your primary care physician (or establish a relationship with a primary care physician if you do not have one) for your aftercare needs so that they can reassess your need for medications and monitor your lab values.   DATA REVIEW:   CBC  Recent Labs  Lab 02/12/18 0450  WBC 17.5*  HGB 13.8  HCT 41.4  PLT 289    Chemistries  Recent Labs  Lab 02/11/18 1830 02/12/18 0450  NA 139 139  K 3.9 3.4*  CL 111 110  CO2 18* 20*  GLUCOSE 110* 90  BUN 12 10  CREATININE 0.65 0.57  CALCIUM 8.9 9.0  AST 20  --   ALT 14  --   ALKPHOS 65  --   BILITOT 0.8  --     Microbiology Results   No results found for this or any previous visit (from the past 240 hour(s)).  RADIOLOGY:  Dg Abdomen Acute W/chest  Result Date: 02/12/2018 CLINICAL DATA:  Acute exacerbation of cyclic vomiting syndrome. EXAM: DG ABDOMEN ACUTE W/ 1V CHEST COMPARISON:  None. FINDINGS: Cardiomediastinal silhouette is normal. Lungs are clear, no pleural effusions. No pneumothorax. Soft tissue planes and included osseous structures are unremarkable. Bowel gas pattern is nondilated and nonobstructive. Surgical clips in the included right abdomen compatible with cholecystectomy. Phleboliths project in the pelvis. No intra-abdominal mass effect, pathologic calcifications or free air. Soft tissue planes and included osseous structures are non-suspicious. RIGHT gluteal injection granuloma. IMPRESSION: Normal chest radiograph.  Normal bowel gas pattern. Electronically Signed   By: Awilda Metroourtnay  Bloomer M.D.   On: 02/12/2018 01:21     Management plans discussed with the patient, family and they are in agreement.  CODE STATUS:     Code Status Orders  (From admission, onward)         Start     Ordered   02/12/18 0400  Full code  Continuous     02/12/18 0359        Code Status History    This patient has a current  code status but no historical code status.      TOTAL TIME TAKING CARE OF THIS PATIENT: 40 minutes.    Enedina FinnerSona Adilynne Fitzwater M.D on 02/12/2018 at 5:34 PM  Between 7am to 6pm - Pager - [phone] After 6pm go to [email or web address] - Social research officer, governmentpassword [email or web address]  [facility]  Office  [phone]  CC: Primary care physician; [facility]

## 2018-02-12 NOTE — Progress Notes (Signed)
Patient ID: Theresa Carpenter, female   DOB: 21-May-1980, 37 y.o.   MRN: [id number] Per RN tolerated po soft diet well. Pt ok to go home

## 2018-02-12 NOTE — Progress Notes (Signed)
Patient is being discharged home this evening. DC instructions given and patient acknowledged understanding. IV removed. Patient packed belongings. Awaiting orders and patient's ride home.

## 2018-02-12 NOTE — Plan of Care (Signed)
  Problem: Nutrition: Goal: Adequate nutrition will be maintained Outcome: Progressing   Problem: Coping: Goal: Level of anxiety will decrease Outcome: Progressing   

## 2018-02-14 LAB — HIV ANTIBODY (ROUTINE TESTING W REFLEX): HIV Screen 4th Generation wRfx: NONREACTIVE

## 2019-09-29 DIAGNOSIS — R112 Nausea with vomiting, unspecified: Secondary | ICD-10-CM | POA: Insufficient documentation

## 2019-09-29 DIAGNOSIS — E872 Acidosis, unspecified: Secondary | ICD-10-CM | POA: Diagnosis present

## 2020-01-21 ENCOUNTER — Other Ambulatory Visit: Payer: Self-pay

## 2020-01-21 ENCOUNTER — Emergency Department
Admission: EM | Admit: 2020-01-21 | Discharge: 2020-01-21 | Disposition: A | Discharge status: Home / Self Care | Payer: Medicaid Other | Attending: Emergency Medicine | Admitting: Emergency Medicine

## 2020-01-21 ENCOUNTER — Encounter: Payer: Self-pay | Admitting: Emergency Medicine

## 2020-01-21 DIAGNOSIS — Z79899 Other long term (current) drug therapy: Secondary | ICD-10-CM | POA: Insufficient documentation

## 2020-01-21 DIAGNOSIS — R112 Nausea with vomiting, unspecified: Secondary | ICD-10-CM | POA: Diagnosis present

## 2020-01-21 DIAGNOSIS — R1013 Epigastric pain: Secondary | ICD-10-CM | POA: Diagnosis not present

## 2020-01-21 DIAGNOSIS — I10 Essential (primary) hypertension: Secondary | ICD-10-CM | POA: Insufficient documentation

## 2020-01-21 DIAGNOSIS — F17210 Nicotine dependence, cigarettes, uncomplicated: Secondary | ICD-10-CM | POA: Diagnosis not present

## 2020-01-21 DIAGNOSIS — R1115 Cyclical vomiting syndrome unrelated to migraine: Secondary | ICD-10-CM | POA: Insufficient documentation

## 2020-01-21 LAB — URINALYSIS, COMPLETE (UACMP) WITH MICROSCOPIC
Bacteria, UA: NONE SEEN
Bilirubin Urine: NEGATIVE
Glucose, UA: NEGATIVE mg/dL
Hgb urine dipstick: NEGATIVE
Ketones, ur: NEGATIVE mg/dL
Leukocytes,Ua: NEGATIVE
Nitrite: NEGATIVE
Protein, ur: 30 mg/dL — AB
Specific Gravity, Urine: 1.026 (ref 1.005–1.030)
pH: 5 (ref 5.0–8.0)

## 2020-01-21 LAB — COMPREHENSIVE METABOLIC PANEL
ALT: 12 U/L (ref 0–44)
AST: 19 U/L (ref 15–41)
Albumin: 4.2 g/dL (ref 3.5–5.0)
Alkaline Phosphatase: 82 U/L (ref 38–126)
Anion gap: 10 (ref 5–15)
BUN: 9 mg/dL (ref 6–20)
CO2: 18 mmol/L — ABNORMAL LOW (ref 22–32)
Calcium: 9.3 mg/dL (ref 8.9–10.3)
Chloride: 112 mmol/L — ABNORMAL HIGH (ref 98–111)
Creatinine, Ser: 0.68 mg/dL (ref 0.44–1.00)
GFR, Estimated: >60 mL/min (ref >60)
Glucose, Bld: 116 mg/dL — ABNORMAL HIGH (ref 70–99)
Potassium: 3.6 mmol/L (ref 3.5–5.1)
Sodium: 140 mmol/L (ref 135–145)
Total Bilirubin: 0.6 mg/dL (ref 0.3–1.2)
Total Protein: 7.5 g/dL (ref 6.5–8.1)

## 2020-01-21 LAB — LIPASE, BLOOD: Lipase: 21 U/L (ref 11–51)

## 2020-01-21 LAB — CBC
HCT: 44.2 % (ref 36.0–46.0)
Hemoglobin: 15.1 g/dL — ABNORMAL HIGH (ref 12.0–15.0)
MCH: 32.1 pg (ref 26.0–34.0)
MCHC: 34.2 g/dL (ref 30.0–36.0)
MCV: 93.8 fL (ref 80.0–100.0)
Platelets: 313 10*3/uL (ref 150–400)
RBC: 4.71 MIL/uL (ref 3.87–5.11)
RDW: 15.1 % (ref 11.5–15.5)
WBC: 10.5 10*3/uL (ref 4.0–10.5)
nRBC: 0 % (ref 0.0–0.2)

## 2020-01-21 LAB — POC URINE PREG, ED: Preg Test, Ur: NEGATIVE

## 2020-01-21 MED ORDER — LACTATED RINGERS IV BOLUS
1000.0000 mL | Freq: Once | INTRAVENOUS | Status: AC
  Administered 2020-01-21: 1000 mL via INTRAVENOUS

## 2020-01-21 MED ORDER — PROMETHAZINE HCL 25 MG/ML IJ SOLN
12.5000 mg | Freq: Once | INTRAMUSCULAR | Status: AC
  Administered 2020-01-21: 12.5 mg via INTRAVENOUS
  Filled 2020-01-21: qty 1

## 2020-01-21 NOTE — ED Provider Notes (Signed)
[facility] Emergency Department Provider Note   ____________________________________________   First MD Initiated Contact with Patient 01/21/20 1020     (approximate)  I have reviewed the triage vital signs and the nursing notes.   HISTORY  Chief Complaint Emesis    HPI Theresa Carpenter is a 39 y.o. female with past medical history of hypertension and cyclic vomiting syndrome who presents to the ED complaining of nausea and vomiting.  Patient reports that ever since 9 PM last night she has been having a hard time keeping down either liquids or solids.  She describes frequent vomiting along with pain in her epigastrium.  She has had loose stools, but denies diarrhea.  She denies any fevers, dysuria, vaginal bleeding, or vaginal discharge, states that her LMP was approximately 2 weeks ago.  She describes current symptoms as similar to prior flareups of cyclic vomiting syndrome.  She has tried taking her Phenergan as well as rizatriptan, which she uses as a "rescue medication", without significant relief.        Past Medical History:  Diagnosis Date  . Cyclic vomiting syndrome   . Family history of adverse reaction to anesthesia    BROTHER-N/V  . Hypertension   . Tachycardia     Patient Active Problem List   Diagnosis Date Noted  . Intractable vomiting with nausea 02/12/2018    Past Surgical History:  Procedure Laterality Date  . CHOLECYSTECTOMY    . HEMORRHOID SURGERY N/A 01/27/2017   Procedure: HEMORRHOIDECTOMY;  Surgeon: Carolan Shiver, MD;  Location: [facility] ORS;  Service: General;  Laterality: N/A;  . RECTAL EXAM UNDER ANESTHESIA N/A 01/27/2017   Procedure: RECTAL EXAM UNDER ANESTHESIA;  Surgeon: Carolan Shiver, MD;  Location: [facility] ORS;  Service: General;  Laterality: N/A;    Prior to Admission medications   Medication Sig Start Date End Date Taking? Authorizing Provider  hydrOXYzine (ATARAX/VISTARIL) 25 MG tablet Take 25 mg by  mouth 3 (three) times daily.    [provider]  metoprolol succinate (TOPROL-XL) 50 MG 24 hr tablet Take 50 mg by mouth daily.  09/15/16   [provider]  naproxen sodium (ALEVE) 220 MG tablet Take 220 mg by mouth.    [provider]  ondansetron (ZOFRAN ODT) 4 MG disintegrating tablet Take 1 tablet (4 mg total) by mouth every 8 (eight) hours as needed for nausea or vomiting. 02/11/18   Jene Every, MD  promethazine (PHENERGAN) 25 MG tablet Take 25 mg by mouth every 6 (six) hours as needed for nausea. 07/30/16   [provider]  rizatriptan (MAXALT) 5 MG tablet Take 5 mg by mouth as needed (vomiting syndrome). May repeat in 2 hours if needed    [provider]  topiramate (TOPAMAX) 25 MG tablet Take 25 mg by mouth 2 (two) times daily. 09/14/16   [provider]    Allergies Patient has no known allergies.  No family history on file.  Social History Social History   Tobacco Use  . Smoking status: Current Every Day Smoker    Packs/day: 0.30    Years: 10.00    Pack years: 3.00    Types: Cigarettes  . Smokeless tobacco: Never Used  Vaping Use  . Vaping Use: Never used  Substance Use Topics  . Alcohol use: No  . Drug use: Yes    Frequency: 2.0 times per week    Types: Marijuana    Review of Systems  Constitutional: No fever/chills Eyes: No visual changes.  ENT: No sore throat. Cardiovascular: Denies chest pain. Respiratory: Denies shortness of breath. Gastrointestinal: Positive for abdominal pain, nausea, and vomiting.  No diarrhea.  No constipation. Genitourinary: Negative for dysuria. Musculoskeletal: Negative for back pain. Skin: Negative for rash. Neurological: Negative for headaches, focal weakness or numbness.  ____________________________________________   PHYSICAL EXAM:  VITAL SIGNS: ED Triage Vitals  Enc Vitals Group     BP 01/21/20 0805 (!) 134/104     Pulse Rate 01/21/20 0805 (!) 115     Resp 01/21/20  0805 20     Temp 01/21/20 0805 98.9 F (37.2 C)     Temp Source 01/21/20 0805 Oral     SpO2 01/21/20 0805 100 %     Weight 01/21/20 0803 175 lb (79.4 kg)     Height 01/21/20 0803 5\' 3"  (1.6 m)     Head Circumference --      Peak Flow --      Pain Score 01/21/20 0803 9     Pain Loc --      Pain Edu? --      Excl. in GC? --     Constitutional: Alert and oriented. Eyes: Conjunctivae are normal. Head: Atraumatic. Nose: No congestion/rhinnorhea. Mouth/Throat: Mucous membranes are moist. Neck: Normal ROM Cardiovascular: Normal rate, regular rhythm. Grossly normal heart sounds. Respiratory: Normal respiratory effort.  No retractions. Lungs CTAB. Gastrointestinal: Soft and nontender. No distention. Genitourinary: deferred Musculoskeletal: No lower extremity tenderness nor edema. Neurologic:  Normal speech and language. No gross focal neurologic deficits are appreciated. Skin:  Skin is warm, dry and intact. No rash noted. Psychiatric: Mood and affect are normal. Speech and behavior are normal.  ____________________________________________   LABS (all labs ordered are listed, but only abnormal results are displayed)  Labs Reviewed  COMPREHENSIVE METABOLIC PANEL - Abnormal; Notable for the following components:      Result Value   Chloride 112 (*)    CO2 18 (*)    Glucose, Bld 116 (*)    All other components within normal limits  CBC - Abnormal; Notable for the following components:   Hemoglobin 15.1 (*)    All other components within normal limits  URINALYSIS, COMPLETE (UACMP) WITH MICROSCOPIC - Abnormal; Notable for the following components:   Color, Urine YELLOW (*)    APPearance HAZY (*)    Protein, ur 30 (*)    All other components within normal limits  LIPASE, BLOOD  POC URINE PREG, ED   ____________________________________________  EKG  ED ECG REPORT I, 01/23/20, the attending physician, personally viewed and interpreted this ECG.   Date: 01/21/2020  EKG  Time: 10:42  Rate: 113  Rhythm: sinus tachycardia  Axis: Normal  Intervals:none  ST&T Change: None   PROCEDURES  Procedure(s) performed (including Critical Care):  Procedures   ____________________________________________   INITIAL IMPRESSION / ASSESSMENT AND PLAN / ED COURSE       39 year old female with past medical history of hypertension and cyclic vomiting syndrome who presents to the ED complaining of nausea and vomiting associated with epigastric abdominal pain starting last night around 9 PM.  Patient describes current symptoms as similar to flareups of cyclic vomiting syndrome in the past, currently has no focal abdominal tenderness and we will hold off on abdominal imaging.  She does have a history of cholecystectomy.  Lab work thus far is reassuring, LFTs and lipase within normal limits.  EKG shows no arrhythmia or ischemia, QTC within normal limits.  Urine pregnancy and UA are pending.  We will hydrate patient with IV fluids and treat symptoms with IV Phenergan.  If she is able to tolerate p.o. here in the ED, she would be appropriate for discharge home with PCP follow-up.  Patient is feeling better post Phenergan, has been able to tolerate p.o. without difficulty.  No further nausea or vomiting here in the ED and she is appropriate for discharge home.  She has Phenergan available at home to use as needed and was counseled to follow-up with her PCP.  Patient counseled to return to the ED for new worsening symptoms, patient agrees with plan.      ____________________________________________   FINAL CLINICAL IMPRESSION(S) / ED DIAGNOSES  Final diagnoses:  Cyclic vomiting syndrome     ED Discharge Orders    None       Note:  This document was prepared using Dragon voice recognition software and may include unintentional dictation errors.   Chesley Noon, MD 01/21/20 [phone]

## 2020-01-21 NOTE — Progress Notes (Signed)
°   01/21/20 1030  Clinical Encounter Type  Visited With Patient  Visit Type Initial  Referral From Chaplain  Consult/Referral To Chaplain  When leaving ED, chaplain saw Pt in the hall and stopped to check on her. Chaplain spoke with Pt briefly and left.

## 2020-01-21 NOTE — ED Triage Notes (Signed)
Pt in via EMS from home with c/o cyclic vomiting syndrome. Pt has taken meds as prescribed but continues to vomit. 167/90, pt has not taken meds for awhile, HR 88

## 2020-01-21 NOTE — ED Triage Notes (Signed)
Pt reports has cyclic vomiting and usually her meds work but last night she started with vomiting and has not stopped.

## 2020-01-21 NOTE — ED Notes (Signed)
This RN attempted x 2 for IV to R hand. Unsuccessful. Informed primary RN.

## 2020-06-19 NOTE — ED Provider Notes (Signed)
Sinus Pain/Congestion, UC - Sinus Pain or Congestion        Patient:   Margaret Carrillo, Margaret Carrillo             MRN: [id number]            FIN: [id number]               Age:   39 years     Sex:  Female     DOB:  Jan 13, 1981   Associated Diagnoses:   Acute maxillary sinusitis; Sinus tachycardia   Author:   Merlinda Frederick      Basic Information   Time seen: Provider Seen (ST)   ED Provider/Time:    Novella Olive ETHAN-MD / 06/19/2020 20:49  .   Additional information: Chief Complaint from Nursing Triage Note   Chief Complaint  Chief Complaint: Pt c/o of sinus pressure and drainage x 2 months, pt endorses taking OTC medication with little relief - Pt also endorses being out of HTN medications since December, states "It was also supposed to lower my heart rate, bc I don't feel it high" (06/19/20 20:32:00).      History of Present Illness   The patient presents with ear, nose, throat problem.  The onset was 3  days ago.  The course/duration of symptoms is constant.  Location: Right face maxilla. The character of symptoms is pain.  The degree at present is moderate.  Risk factors consist of History of sinus infections in the past.  Prior episodes: frequent.  Therapy today: see nurses notes.  Associated symptoms: denies fever, denies chills, denies vomiting, denies headache, denies altered vision, denies change in voice, denies dyspnea and denies difficulty swallowing.  Additional history: Mild nonproductive cough, states postnasal drip etiology, dull constant pain right face nonradiating is taken no medication for this prior to arrival.        Review of Systems   Constitutional symptoms:  Chills, No fever,    ENMT symptoms:  Nasal congestion, sinus pain.    Respiratory symptoms:  Cough, no shortness of breath, no sputum production, no wheezing.    Cardiovascular symptoms:  No chest pain,              Additional review of systems information: All other systems reviewed and otherwise negative.      Health Status   Medications:   (Selected)   Inpatient Medications  Ordered  Lopressor 1 mg/mL injectable solution: 5 mg, 5 mL, IV Push, Once  Sodium Chloride 0.9% bolus: 1,000 mL, 2000 mL/hr, IV Piggyback, Once  Documented Medications  Documented  SUMAtriptan 20 mg/inh nasal spray: 1 sprays, Nasal, Once, PRN: for migraine headache, 1 EA, 0 Refill(s)  promethazine: 0 Refill(s)  topiramate 25 mg oral capsule: mg, caps, Oral, BID, 0 Refill(s).      Past Medical/ Family/ Social History   Medical history:    No active or resolved past medical history items have been selected or recorded., Reviewed as documented in chart.   Surgical history: Reviewed as documented in chart.   Family history: Not significant.   Social history: Reviewed as documented in chart.   Problem list:    Active Problems (1)  Cyclic vomiting syndrome   .      Physical Examination               Vital Signs   Vital Signs   06/19/2020 21:02 EDT Systolic Blood Pressure 163 mmHg  HI    Diastolic Blood Pressure 121  mmHg  >HHI    Heart Rate Monitored 133 bpm  >HHI    Respiratory Rate 18 br/min    SpO2 100 %   06/19/2020 21:00 EDT Systolic Blood Pressure 172 mmHg  HI    Diastolic Blood Pressure 128 mmHg  >HHI    Heart Rate Monitored 125 bpm  HI    Respiratory Rate 15 br/min    SpO2 100 %   06/19/2020 20:32 EDT Systolic Blood Pressure 159 mmHg  HI    Diastolic Blood Pressure 123 mmHg  >HHI    Temperature Oral 37.1 degC    Heart Rate Monitored 143 bpm  >HHI    Respiratory Rate 17 br/min    SpO2 100 %   .   Measurements   06/19/2020 20:38 EDT Body Mass Index est meas 30.86 kg/m2    Body Mass Index Measured 30.86 kg/m2   06/19/2020 20:32 EDT Height/Length Measured 160 cm    Weight Dosing 79 kg   .   Basic Oxygen Information   06/19/2020 21:02 EDT Oxygen Therapy Room air    SpO2 100 %   06/19/2020 21:00 EDT Oxygen Therapy Room air    SpO2 100 %   06/19/2020 20:32 EDT Oxygen Therapy Room air    SpO2 100 %   .   General:  Alert, no acute distress.    Skin:  Warm, dry.    Head:  Normocephalic,  atraumatic, No signs of Potts puffy tumor.    Neck:  No meningismus or nuchal rigidity.   Eye:  Extraocular movements are intact, normal conjunctiva.    Ears, nose, mouth and throat:  Oral mucosa moist, Tympanic membrane: Bilateral Tympanic membranes are clear, Sinus: Mild tenderness in the right maxillary sinus region without erythema or bulging, Nose: Bilateral turbinate edema with mild drainage, no septal hematoma, Throat: Mild posterior oropharyngeal cobblestoning with postnasal drip.    Cardiovascular:  Normal peripheral perfusion, Tachycardia, normal rhythm.    Respiratory:  Lungs are clear to auscultation, respirations are non-labored.    Chest wall:  No deformity.   Back:  Nontender, Normal range of motion.    Musculoskeletal:  Normal ROM, normal strength.    Gastrointestinal:  Soft, Nontender, Non distended.    Neurological:  No focal neurological deficit observed, normal motor observed, normal coordination observed.    Psychiatric:  Cooperative, appropriate mood & affect.       Medical Decision Making   Documents reviewed:  Emergency department nurses' notes.   Results review:  Lab results : Lab View   06/19/2020 21:39 EDT WBC 8.7 x10e3/mcL    RBC 4.65 x10e6/mcL    Hgb 14.2 g/dL    HCT 01.6 %    MCV 01.0 fL    MCH 30.5 pg    MCHC 33.6 g/dL    RDW 93.2 %    Platelet 296 x10e3/mcL    MPV 10.7 fL    Neutro Auto 61.0 %    Neutro Absolute 5.3 x10e3/mcL    Immature Grans Percent 0.1 %    Immature Grans Absolute 0.01 x10e3/mcL    Lymph Auto 27.8 %    Lymph Absolute 2.4 x10e3/mcL    Mono Auto 7.3 %    Mono Absolute 0.6 x10e3/mcL    Eosinophil Percent 3.3 %    Eos Absolute 0.3 x10e3/mcL    Basophil Auto 0.5 %    Baso Absolute 0.0 x10e3/mcL    NRBC Absolute Auto 0.000 x10e3/mcL    NRBC Percent Auto 0.0 %  Lactic Acid Lvl 1.1 mmol/L     , Pulse Oximetry is greater than 94% on room air which is acceptable per my interpretation.       Reexamination/ Reevaluation   Vital signs   Basic Oxygen Information   06/19/2020  21:02 EDT Oxygen Therapy Room air    SpO2 100 %   06/19/2020 21:00 EDT Oxygen Therapy Room air    SpO2 100 %   06/19/2020 20:32 EDT Oxygen Therapy Room air    SpO2 100 %      Notes: IV fluids Lopressor, reassessment overall vital signs much improved.  Will discharge home with Lopressor as this is when she has been prescribed in the past and is supposed to be taking.  Follow-up ENT as needed.      Impression and Plan   Diagnosis   Acute maxillary sinusitis (ICD10-CM J01.00, Discharge, Medical)   Sinus tachycardia (ICD10-CM R00.0, Discharge, Medical)   Plan   Condition: Stable.    Disposition: Discharged: Time  06/19/2020 22:05:00, to home.    Prescriptions   Patient was given the following educational materials: Pt. education, Sinusitis, Adult, Easy-to-Read, Sinus Tachycardia, Hypertension, Adult, Easy-to-Read, Hypertension, Adult, Easy-to-Read, Sinus Tachycardia, Sinusitis, Adult, Easy-to-Read.    Follow up with: [facility] Follow-[facility], [facility] Within 1 week, only if needed.    Counseled: I had a detailed discussion with the patient and/or guardian regarding the historical points/exam findings supporting the discharge diagnosis and need for outpatient followup. Discussed the need to return to the ER if symptoms persist/worsen, or for any questions/concerns that arise at home.    Games developer Signed on 06/19/2020 10:17 PM EDT   ________________________________________________   Merlinda Frederick      Electronically Signed on 06/19/2020 10:18 PM EDT   ________________________________________________   Merlinda Frederick            Modified by: Merlinda Frederick on 06/19/2020 09:59 PM EDT      Modified by: Novella Olive ETHAN-MD on 06/19/2020 10:18 PM EDT

## 2020-06-19 NOTE — ED Notes (Signed)
ED Patient Education Note     Patient Education Materials Follows:  Cardiovascular     Hypertension, Adult    Hypertension is another name for high blood pressure. High blood pressure forces your heart to work harder to pump blood. This can cause problems over time.    There are two numbers in a blood pressure reading. There is a top number (systolic) over a bottom number (diastolic). It is best to have a blood pressure that is below 120/80. Healthy choices can help lower your blood pressure, or you may need medicine to help lower it.      What are the causes?    The cause of this condition is not known. Some conditions may be related to high blood pressure.      What increases the risk?     Smoking.     Having type 2 diabetes mellitus, high cholesterol, or both.     Not getting enough exercise or physical activity.     Being overweight.     Having too much fat, sugar, calories, or salt (sodium) in your diet.     Drinking too much alcohol.     Having long-term (chronic) kidney disease.     Having a family history of high blood pressure.     Age. Risk increases with age.     Race. You may be at higher risk if you are African American.     Gender. Men are at higher risk than women before age 45. After age 65, women are at higher risk than men.     Having obstructive sleep apnea.     Stress.      What are the signs or symptoms?     High blood pressure may not cause symptoms. Very high blood pressure (hypertensive crisis) may cause:  ? Headache.    ? Feelings of worry or nervousness (anxiety).    ? Shortness of breath.    ? Nosebleed.    ? A feeling of being sick to your stomach (nausea).    ? Throwing up (vomiting).    ? Changes in how you see.    ? Very bad chest pain.    ? Seizures.        How is this treated?     This condition is treated by making healthy lifestyle changes, such as:  ? Eating healthy foods.    ? Exercising more.    ? Drinking less alcohol.       Your health care provider may prescribe medicine if  lifestyle changes are not enough to get your blood pressure under control, and if:  ? Your top number is above 130.    ? Your bottom number is above 80.       Your personal target blood pressure may vary.      Follow these instructions at home:    Eating and drinking       If told, follow the DASH eating plan. To follow this plan:  ? Fill one half of your plate at each meal with fruits and vegetables.    ? Fill one fourth of your plate at each meal with whole grains. Whole grains include whole-wheat pasta, brown rice, and whole-grain bread.    ? Eat or drink low-fat dairy products, such as skim milk or low-fat yogurt.    ? Fill one fourth of your plate at each meal with low-fat (lean) proteins. Low-fat proteins include fish, chicken without skin, eggs, beans,   and tofu.    ? Avoid fatty meat, cured and processed meat, or chicken with skin.    ? Avoid pre-made or processed food.       Eat less than 1,500 mg of salt each day.     Do not drink alcohol if:  ? Your doctor tells you not to drink.    ? You are pregnant, may be pregnant, or are planning to become pregnant.       If you drink alcohol:  ? Limit how much you use to:  ? 0?1 drink a day for women.    ? 0?2 drinks a day for men.      ? Be aware of how much alcohol is in your drink. In the [place] one drink equals one 12 oz bottle of beer (355 mL), one 5 oz glass of wine (148 mL), or one 1? oz glass of hard liquor (44 mL).        Lifestyle       Work with your doctor to stay at a healthy weight or to lose weight. Ask your doctor what the best weight is for you.     Get at least 30 minutes of exercise most days of the week. This may include walking, swimming, or biking.     Get at least 30 minutes of exercise that strengthens your muscles (resistance exercise) at least 3 days a week. This may include lifting weights or doing Pilates.     Do not use any products that contain nicotine or tobacco, such as cigarettes, e-cigarettes, and chewing tobacco. If you need help  quitting, ask your doctor.     Check your blood pressure at home as told by your doctor.     Keep all follow-up visits as told by your doctor. This is important.      Medicines     Take over-the-counter and prescription medicines only as told by your doctor. Follow directions carefully.     Do not skip doses of blood pressure medicine. The medicine does not work as well if you skip doses. Skipping doses also puts you at risk for problems.     Ask your doctor about side effects or reactions to medicines that you should watch for.        Contact a doctor if you:     Think you are having a reaction to the medicine you are taking.     Have headaches that keep coming back (recurring).     Feel dizzy.     Have swelling in your ankles.     Have trouble with your vision.      Get help right away if you:     Get a very bad headache.     Start to feel mixed up (confused).     Feel weak or numb.     Feel faint.     Have very bad pain in your:  ? Chest.    ? Belly (abdomen).       Throw up more than once.     Have trouble breathing.      Summary     Hypertension is another name for high blood pressure.     High blood pressure forces your heart to work harder to pump blood.     For most people, a normal blood pressure is less than 120/80.     Making healthy choices can help lower blood pressure. If your blood pressure does not get lower   with healthy choices, you may need to take medicine.      This information is not intended to replace advice given to you by your health care provider. Make sure you discuss any questions you have with your health care provider.      Document Revised: 10/20/2017 Document Reviewed: 10/20/2017  Elsevier Patient Education ? 2021 [employer]      Emergency Medicine     Sinus Tachycardia      Sinus tachycardia is a kind of fast heartbeat. In sinus tachycardia, the heart beats more than 100 times a minute. Sinus tachycardia starts in a part of the heart called the sinus node. Sinus tachycardia may be  harmless, or it may be a sign of a serious condition.      What are the causes?    This condition may be caused by:   Exercise or exertion.     A fever.     Pain.     Loss of body fluids (dehydration).     Severe bleeding (hemorrhage).     Anxiety and stress.     Certain substances, including:  ? Alcohol.    ? Caffeine.    ? Tobacco and nicotine products.    ? Cold medicines.    ? Illegal drugs.       Medical conditions including:  ? Heart disease.    ? An infection.    ? An overactive thyroid (hyperthyroidism).    ? A lack of red blood cells (anemia).          What are the signs or symptoms?    Symptoms of this condition include:   A feeling that the heart is beating quickly (palpitations).     Suddenly noticing your heartbeat (cardiac awareness).     Dizziness.     Tiredness (fatigue).     Shortness of breath.     Chest pain.     Nausea.     Fainting.        How is this diagnosed?    This condition is diagnosed with:   A physical exam.     Other tests, such as:  ? Blood tests.    ? An electrocardiogram (ECG). This test measures the electrical activity of the heart.    ? Ambulatory cardiac monitor. This records your heartbeats for 24 hours or more.        You may be referred to a heart specialist (cardiologist).      How is this treated?    Treatment for this condition depends on the cause or the underlying condition. Treatment may involve:   Treating the underlying condition.     Taking new medicines or changing your current medicines as told by your health care provider.     Making changes to your diet or lifestyle.        Follow these instructions at home:    Lifestyle       Do not use any products that contain nicotine or tobacco, such as cigarettes and e-cigarettes. If you need help quitting, ask your health care provider.     Do not use illegal drugs, such as cocaine.     Learn relaxation methods to help you when you get stressed or anxious. These include deep breathing.     Avoid caffeine or other  stimulants.      Alcohol use       Do not drink alcohol if:  ? Your health care provider tells you not   to drink.    ? You are pregnant, may be pregnant, or are planning to become pregnant.       If you drink alcohol, limit how much you have:  ? 0?1 drink a day for women.    ? 0?2 drinks a day for men.       Be aware of how much alcohol is in your drink. In the [place] one drink equals one typical bottle of beer (12 oz), one-half glass of wine (5 oz), or one shot of hard liquor (1? oz).      General instructions     Drink enough fluids to keep your urine pale yellow.     Take over-the-counter and prescription medicines only as told by your health care provider.     Keep all follow-up visits as told by your health care provider. This is important.        Contact a health care provider if you have:     A fever.     Vomiting or diarrhea that does not go away.      Get help right away if you:     Have pain in your chest, upper arms, jaw, or neck.     Become weak or dizzy.     Feel faint.     Have palpitations that do not go away.      Summary     In sinus tachycardia, the heart beats more than 100 times a minute.     Sinus tachycardia may be harmless, or it may be a sign of a serious condition.     Treatment for this condition depends on the cause or the underlying condition.     Get help right away if you have pain in your chest, upper arms, jaw, or neck.      This information is not intended to replace advice given to you by your health care provider. Make sure you discuss any questions you have with your health care provider.      Document Revised: 03/31/2017 Document Reviewed: 03/31/2017  Elsevier Patient Education ? 2021 Else[employer] Inc.      Infectious Disease     Sinusitis, Adult      Sinusitis is soreness and swelling (inflammation) of your sinuses. Sinuses are hollow spaces in the bones around your face. They are located:   Around your eyes.     In the middle of your forehead.     Behind your nose.     In your  cheekbones.      Your sinuses and nasal passages are lined with a fluid called mucus. Mucus drains out of your sinuses. Swelling can trap mucus in your sinuses. This lets germs (bacteria, virus, or fungus) grow, which leads to infection. Most of the time, this condition is caused by a virus.      What are the causes?    This condition is caused by:   Allergies.      Asthma.      Germs.     Things that block your nose or sinuses.     Growths in the nose (nasal polyps).     Chemicals or irritants in the air.     Fungus (rare).        What increases the risk?    You are more likely to develop this condition if:   You have a weak body defense system (immune system).     You do a lot of swimming or diving.  You use nasal sprays too much.     You smoke.        What are the signs or symptoms?    The main symptoms of this condition are pain and a feeling of pressure around the sinuses. Other symptoms include:   Stuffy nose (congestion).     Runny nose (drainage).     Swelling and warmth in the sinuses.     Headache.      Toothache.     A cough that may get worse at night.     Mucus that collects in the throat or the back of the nose (postnasal drip).     Being unable to smell and taste.     Being very tired (fatigue).     A fever.     Sore throat.     Bad breath.         How is this diagnosed?    This condition is diagnosed based on:   Your symptoms.      Your medical history.      A physical exam.      Tests to find out if your condition is short-term (acute) or long-term (chronic). Your doctor may:  ? Check your nose for growths (polyps).    ? Check your sinuses using a tool that has a light (endoscope).    ? Check for allergies or germs.    ? Do imaging tests, such as an MRI or CT scan.          How is this treated?    Treatment for this condition depends on the cause and whether it is short-term or long-term.   If caused by a virus, your symptoms should go away on their own within 10 days. You may be given medicines  to relieve symptoms. They include:  ? Medicines that shrink swollen tissue in the nose.     ? Medicines that treat allergies (antihistamines).     ? A spray that treats swelling of the nostrils.?    ? Rinses that help get rid of thick mucus in your nose (nasal saline washes).        If caused by bacteria, your doctor may wait to see if you will get better without treatment. You may be given antibiotic medicine if you have:  ? A very bad infection.    ? A weak body defense system.       If caused by growths in the nose, you may need to have surgery.        Follow these instructions at home:    Medicines     Take, use, or apply over-the-counter and prescription medicines only as told by your doctor. These may include nasal sprays.     If you were prescribed an antibiotic medicine, take it as told by your doctor. Do not stop taking the antibiotic even if you start to feel better.      Hydrate and humidify       Drink enough water to keep your pee (urine) pale yellow.     Use a cool mist humidifier to keep the humidity level in your home above 50%.     Breathe in steam for 10?15 minutes, 3?4 times a day, or as told by your doctor. You can do this in the bathroom while a hot shower is running.     Try not to spend time in cool or dry air.      Rest  Rest as much as you can.     Sleep with your head raised (elevated).     Make sure you get enough sleep each night.      General instructions       Put a warm, moist washcloth on your face 3?4 times a day, or as often as told by your doctor. This will help with discomfort.     Wash your hands often with soap and water. If there is no soap and water, use hand sanitizer.     Do not smoke. Avoid being around people who are smoking (secondhand smoke).     Keep all follow-up visits as told by your doctor. This is important.        Contact a doctor if:     You have a fever.     Your symptoms get worse.     Your symptoms do not get better within 10 days.      Get help right away  if:     You have a very bad headache.     You cannot stop throwing up (vomiting).     You have very bad pain or swelling around your face or eyes.     You have trouble seeing.     You feel confused.     Your neck is stiff.     You have trouble breathing.      Summary     Sinusitis is swelling of your sinuses. Sinuses are hollow spaces in the bones around your face.     This condition is caused by tissues in your nose that become inflamed or swollen. This traps germs. These can lead to infection.     If you were prescribed an antibiotic medicine, take it as told by your doctor. Do not stop taking it even if you start to feel better.     Keep all follow-up visits as told by your doctor. This is important.      This information is not intended to replace advice given to you by your health care provider. Make sure you discuss any questions you have with your health care provider.      Document Revised: 07/12/2017 Document Reviewed: 07/12/2017  Elsevier Patient Education ? 2021 Elsevier Inc.

## 2020-06-19 NOTE — ED Notes (Signed)
ED Triage Note       ED Secondary Triage Entered On:  06/19/2020 20:40 EDT    Performed On:  06/19/2020 20:38 EDT by Royston Cowper               General Information   Barriers to Learning :   None evident   COVID-19 Vaccine Status :   2 Doses received   [facility] ED Fall Risk Section :   Document assessment   ED Advance Directives Section :   Document assessment   Dawson Bills P-RN - 06/19/2020 21:06 EDT   ED Home Meds Section :   Document assessment   ED Palliative Screen :   N/A (prefilled for <65yo)   Royston Cowper - 06/19/2020 20:38 EDT   (As Of: 06/19/2020 21:07:48 EDT)   Problems(Active)    Cyclic vomiting syndrome (SNOMED CT  :30160109 )  Name of Problem:   Cyclic vomiting syndrome ; Recorder:   Royston Cowper; Confirmation:   Confirmed ; Classification:   Patient Stated ; Code:   [id number] ; Contributor System:   Dietitian ; Last Updated:   06/19/2020 20:40 EDT ; Life Cycle Date:   06/19/2020 ; Life Cycle Status:   Active ; Vocabulary:   SNOMED CT          Diagnoses(Active)    Sinus Pain/Congestion  Date:   06/19/2020 ; Diagnosis Type:   Reason For Visit ; Confirmation:   Complaint of ; Clinical Dx:   Sinus Pain/Congestion ; Classification:   Medical ; Clinical Service:   Non-Specified ; Code:   PNED ; Probability:   0 ; Diagnosis Code:   [phone]             -    Procedure History   (As Of: 06/19/2020 21:07:48 EDT)     [facility] Fall Risk Assessment Tool   Hx of falling last 3 months ED Fall :   No   Dawson Bills P-RN - 06/19/2020 21:06 EDT   ED Advance Directive   Advance Directive :   No   Dawson Bills P-RN - 06/19/2020 21:06 EDT   Med Hx   Medication List   (As Of: 06/19/2020 20:40:40 EDT)   Home Meds    SUMAtriptan  :   SUMAtriptan ; Status:   Documented ; Ordered As Mnemonic:   SUMAtriptan 20 mg/inh nasal spray ; Simple Display Line:   1 sprays, Nasal, Once, PRN: for migraine headache, 1 EA, 0 Refill(s) ; Catalog Code:   SUMAtriptan ; Order Dt/Tm:   06/19/2020  20:40:15 EDT          topiramate  :   topiramate ; Status:   Documented ; Ordered As Mnemonic:   topiramate 25 mg oral capsule ; Simple Display Line:   mg, caps, Oral, BID, 0 Refill(s) ; Catalog Code:   topiramate ; Order Dt/Tm:   06/19/2020 20:40:02 EDT          promethazine  :   promethazine ; Status:   Documented ; Ordered As Mnemonic:   promethazine ; Simple Display Line:   0 Refill(s) ; Catalog Code:   promethazine ; Order Dt/Tm:   06/19/2020 20:38:40 EDT

## 2020-06-19 NOTE — ED Notes (Signed)
 ED Triage Note       ED Triage Adult Entered On:  06/19/2020 20:38 EDT    Performed On:  06/19/2020 20:32 EDT by Rosalita Dodge               Triage   Numeric Rating Pain Scale :   0 = No pain   Chief Complaint :   Pt c/o of sinus pressure and drainage x 2 months, pt endorses taking OTC medication with little relief - Pt also endorses being out of HTN medications since December, states It was also supposed to lower my heart rate, bc I don't feel it high   [place] Mode of Arrival :   Private vehicle   Infectious Disease Documentation :   Document assessment   Temperature Oral :   37.1 degC(Converted to: 98.8 degF)    Heart Rate Monitored :   143 bpm (>HHI)    Respiratory Rate :   17 br/min   Systolic Blood Pressure :   159 mmHg (HI)    Diastolic Blood Pressure :   123 mmHg (>HHI)    SpO2 :   100 %   Oxygen Therapy :   Room air   Patient presentation :   None of the above   Chief Complaint or Presentation suggest infection :   No   Weight Dosing :   79 kg(Converted to: 174 lb 3 oz)    Height :   160 cm(Converted to: 5 ft 3 in)    Body Mass Index Dosing :   31 kg/m2   Rosalita Dodge - 06/19/2020 20:32 EDT   DCP GENERIC CODE   Tracking Acuity :   3   Tracking Group :   ED [facility] Main Tracking Group   Rosalita Dodge - 06/19/2020 20:32 EDT   ED General Section :   Document assessment   Pregnancy Status :   Patient denies   ED Allergies Section :   Document assessment   ED Reason for Visit Section :   Document assessment   ED Quick Assessment :   Patient appears awake, alert, oriented to baseline. Skin warm and dry. Moves all extremities. Respiration even and unlabored. Appears in no apparent distress.   Rosalita Dodge - 06/19/2020 20:32 EDT   ID Risk Screen Symptoms   Recent Travel History :   No recent travel   TB Symptom Screen :   No symptoms   Last 90 days COVID-19 ID :   No   Close Contact with COVID-19 ID :   No   Last 14 days COVID-19 ID :   No   Rosalita Dodge - 06/19/2020 20:32 EDT   Allergies   (As  Of: 06/19/2020 20:38:10 EDT)   Allergies (Active)   No Known Allergies  Estimated Onset Date:   Unspecified ; Created By:   Rosalita Dodge; Reaction Status:   Active ; Category:   Drug ; Substance:   No Known Allergies ; Type:   Allergy ; Updated By:   Rosalita Dodge; Reviewed Date:   06/19/2020 20:34 EDT        Psycho-Social   Last 3 mo, thoughts killing self/others :   Patient denies   Right click within box for Suspected Abuse policy link. :   None   Feels Safe Where Live :   Yes   Rosalita Dodge - 06/19/2020 20:32 EDT   ED Reason for Visit   (As Of:  06/19/2020 20:38:10 EDT)   Diagnoses(Active)    Sinus Pain/Congestion  Date:   06/19/2020 ; Diagnosis Type:   Reason For Visit ; Confirmation:   Complaint of ; Clinical Dx:   Sinus Pain/Congestion ; Classification:   Medical ; Clinical Service:   Non-Specified ; Code:   PNED ; Probability:   0 ; Diagnosis Code:   [phone]

## 2020-06-19 NOTE — Discharge Summary (Signed)
 ED Clinical Summary                        [facility]  [address]  [place], [place], [address]  [phone]           PERSON INFORMATION  Name: Margaret Carrillo, Margaret Carrillo Age:  40 Years DOB: May 06, 1980   Sex: Female Language: English PCP: PCP,  NONE   Marital Status: Single Phone: [phone] Med Service: MED-Medicine   MRN: [id number] Acct# [id number] Arrival: 06/19/2020 20:20:00   Visit Reason: Sinus Pain/Congestion; SINUS PRESSUE AND DRAINAGE Acuity: 3 LOS: 000 02:13   Address:    [address] [place] [address]   Diagnosis:    Acute maxillary sinusitis; Sinus tachycardia  Medications:          New Medications  Printed Prescriptions  azithromycin (Zithromax Z-Pak 250 mg oral tablet) 1 Packets Oral (given by mouth) every day for 5 Days. as directed on package labeling. Refills: 0.  Last Dose:____________________  metoprolol (metoprolol succinate 50 mg oral tablet, extended release) 1 Tabs Oral (given by mouth) every day. Refills: 0.  Last Dose:____________________  Medications that have not changed  Other Medications  promethazine   Last Dose:____________________  SUMAtriptan (SUMAtriptan 20 mg/inh nasal spray) 1 Sprays Nasal (into the nose) once as needed for migraine headache.  Last Dose:____________________  topiramate (topiramate 25 mg oral capsule) Oral (given by mouth) 2 times a day.  Last Dose:____________________      Medications Administered During Visit:                Medication Dose Route   Sodium Chloride 0.9% 1000 mL IV Piggyback   metoprolol 5 mg IV Push               Allergies      No Known Allergies      Major Tests and Procedures:  The following procedures and tests were performed during your ED visit.  COMMON PROCEDURES%>  COMMON PROCEDURES COMMENTS%>                PROVIDER INFORMATION               Provider Role Assigned Sampson EZZARD SIEVING [facility] ED Provider 06/19/2020 20:49:59    Floretta Cape P-RN ED Nurse 06/19/2020 20:52:54        Attending Physician:  EZZARD SIEVING  ETHAN-MD      Admit Doc  EZZARD SIEVING DAYS     Consulting Doc       VITALS INFORMATION  Vital Sign Triage Latest   Temp Oral ORAL_1%> ORAL%>   Temp Temporal TEMPORAL_1%> TEMPORAL%>   Temp Intravascular INTRAVASCULAR_1%> INTRAVASCULAR%>   Temp Axillary AXILLARY_1%> AXILLARY%>   Temp Rectal RECTAL_1%> RECTAL%>   02 Sat 100 % 100 %   Respiratory Rate RATE_1%> RATE%>   Peripheral Pulse Rate PULSE RATE_1%> PULSE RATE%>   Apical Heart Rate HEART RATE_1%> HEART RATE%>   Blood Pressure BLOOD PRESSURE_1%>/ BLOOD PRESSURE_1%>123 mmHg BLOOD PRESSURE%> / BLOOD PRESSURE%>110 mmHg                 Immunizations      No Immunizations Documented This Visit          DISCHARGE INFORMATION   Discharge Disposition: H Outpt-Sent Home   Discharge Location:  Home   Discharge Date and Time:  06/19/2020 22:33:30   ED Checkout Date and Time:  06/19/2020 22:33:30     DEPART REASON INCOMPLETE  INFORMATION               Depart Action Incomplete Reason   Interactive View/I&O Recently assessed               Problems      No Problems Documented              Smoking Status      No Smoking Status Documented         PATIENT EDUCATION INFORMATION  Instructions:     Hypertension, Adult, Easy-to-Read; Sinus Tachycardia; Sinusitis, Adult, Easy-to-Read     Follow up:                   With: Address: When:   [facility] ENT Call for appt & office location   [phone] Business (1) Within 1 week, only if needed              ED PROVIDER DOCUMENTATION     Patient:   Margaret Carrillo             MRN: [id number]            FIN: [id number]               Age:   13 years     Sex:  Female     DOB:  10-Jun-1980   Associated Diagnoses:   Acute maxillary sinusitis; Sinus tachycardia   Author:   EZZARD TORIBIO DAYS      Basic Information   Time seen: Provider Seen (ST)   ED Provider/Time:    EZZARD TORIBIO ETHAN-MD / 06/19/2020 20:49  .   Additional information: Chief Complaint from Nursing Triage Note   Chief Complaint  Chief Complaint: Pt c/o of sinus pressure and  drainage x 2 months, pt endorses taking OTC medication with little relief - Pt also endorses being out of HTN medications since December, states It was also supposed to lower my heart rate, bc I don't feel it high (06/19/20 20:32:00).      History of Present Illness   The patient presents with ear, nose, throat problem.  The onset was 3  days ago.  The course/duration of symptoms is constant.  Location: Right face maxilla. The character of symptoms is pain.  The degree at present is moderate.  Risk factors consist of History of sinus infections in the past.  Prior episodes: frequent.  Therapy today: see nurses notes.  Associated symptoms: denies fever, denies chills, denies vomiting, denies headache, denies altered vision, denies change in voice, denies dyspnea and denies difficulty swallowing.  Additional history: Mild nonproductive cough, states postnasal drip etiology, dull constant pain right face nonradiating is taken no medication for this prior to arrival.        Review of Systems   Constitutional symptoms:  Chills, No fever,    ENMT symptoms:  Nasal congestion, sinus pain.    Respiratory symptoms:  Cough, no shortness of breath, no sputum production, no wheezing.    Cardiovascular symptoms:  No chest pain,              Additional review of systems information: All other systems reviewed and otherwise negative.      Health Status   Medications:  (Selected)   Inpatient Medications  Ordered  Lopressor 1 mg/mL injectable solution: 5 mg, 5 mL, IV Push, Once  Sodium Chloride 0.9% bolus: 1,000 mL, 2000 mL/hr, IV Piggyback, Once  Documented Medications  Documented  SUMAtriptan 20 mg/inh nasal  spray: 1 sprays, Nasal, Once, PRN: for migraine headache, 1 EA, 0 Refill(s)  promethazine: 0 Refill(s)  topiramate 25 mg oral capsule: mg, caps, Oral, BID, 0 Refill(s).      Past Medical/ Family/ Social History   Medical history:    No active or resolved past medical history items have been selected or recorded., Reviewed as  documented in chart.   Surgical history: Reviewed as documented in chart.   Family history: Not significant.   Social history: Reviewed as documented in chart.   Problem list:    Active Problems (1)  Cyclic vomiting syndrome   .      Physical Examination               Vital Signs   Vital Signs   06/19/2020 21:02 EDT Systolic Blood Pressure 163 mmHg  HI    Diastolic Blood Pressure 121 mmHg  >HHI    Heart Rate Monitored 133 bpm  >HHI    Respiratory Rate 18 br/min    SpO2 100 %   06/19/2020 21:00 EDT Systolic Blood Pressure 172 mmHg  HI    Diastolic Blood Pressure 128 mmHg  >HHI    Heart Rate Monitored 125 bpm  HI    Respiratory Rate 15 br/min    SpO2 100 %   06/19/2020 20:32 EDT Systolic Blood Pressure 159 mmHg  HI    Diastolic Blood Pressure 123 mmHg  >HHI    Temperature Oral 37.1 degC    Heart Rate Monitored 143 bpm  >HHI    Respiratory Rate 17 br/min    SpO2 100 %   .   Measurements   06/19/2020 20:38 EDT Body Mass Index est meas 30.86 kg/m2    Body Mass Index Measured 30.86 kg/m2   06/19/2020 20:32 EDT Height/Length Measured 160 cm    Weight Dosing 79 kg   .   Basic Oxygen Information   06/19/2020 21:02 EDT Oxygen Therapy Room air    SpO2 100 %   06/19/2020 21:00 EDT Oxygen Therapy Room air    SpO2 100 %   06/19/2020 20:32 EDT Oxygen Therapy Room air    SpO2 100 %   .   General:  Alert, no acute distress.    Skin:  Warm, dry.    Head:  Normocephalic, atraumatic, No signs of Potts puffy tumor.    Neck:  No meningismus or nuchal rigidity.   Eye:  Extraocular movements are intact, normal conjunctiva.    Ears, nose, mouth and throat:  Oral mucosa moist, Tympanic membrane: Bilateral Tympanic membranes are clear, Sinus: Mild tenderness in the right maxillary sinus region without erythema or bulging, Nose: Bilateral turbinate edema with mild drainage, no septal hematoma, Throat: Mild posterior oropharyngeal cobblestoning with postnasal drip.    Cardiovascular:  Normal peripheral perfusion, Tachycardia, normal rhythm.     Respiratory:  Lungs are clear to auscultation, respirations are non-labored.    Chest wall:  No deformity.   Back:  Nontender, Normal range of motion.    Musculoskeletal:  Normal ROM, normal strength.    Gastrointestinal:  Soft, Nontender, Non distended.    Neurological:  No focal neurological deficit observed, normal motor observed, normal coordination observed.    Psychiatric:  Cooperative, appropriate mood & affect.       Medical Decision Making   Documents reviewed:  Emergency department nurses' notes.   Results review:  Lab results : Lab View   06/19/2020 21:39 EDT WBC 8.7 x10e3/mcL    RBC 4.65 x10e6/mcL  Hgb 14.2 g/dL    HCT 57.7 %    MCV 09.1 fL    MCH 30.5 pg    MCHC 33.6 g/dL    RDW 85.6 %    Platelet 296 x10e3/mcL    MPV 10.7 fL    Neutro Auto 61.0 %    Neutro Absolute 5.3 x10e3/mcL    Immature Grans Percent 0.1 %    Immature Grans Absolute 0.01 x10e3/mcL    Lymph Auto 27.8 %    Lymph Absolute 2.4 x10e3/mcL    Mono Auto 7.3 %    Mono Absolute 0.6 x10e3/mcL    Eosinophil Percent 3.3 %    Eos Absolute 0.3 x10e3/mcL    Basophil Auto 0.5 %    Baso Absolute 0.0 x10e3/mcL    NRBC Absolute Auto 0.000 x10e3/mcL    NRBC Percent Auto 0.0 %    Lactic Acid Lvl 1.1 mmol/L     , Pulse Oximetry is greater than 94% on room air which is acceptable per my interpretation.       Reexamination/ Reevaluation   Vital signs   Basic Oxygen Information   06/19/2020 21:02 EDT Oxygen Therapy Room air    SpO2 100 %   06/19/2020 21:00 EDT Oxygen Therapy Room air    SpO2 100 %   06/19/2020 20:32 EDT Oxygen Therapy Room air    SpO2 100 %      Notes: IV fluids Lopressor, reassessment overall vital signs much improved.  Will discharge home with Lopressor as this is when she has been prescribed in the past and is supposed to be taking.  Follow-up ENT as needed.      Impression and Plan   Diagnosis   Acute maxillary sinusitis (ICD10-CM J01.00, Discharge, Medical)   Sinus tachycardia (ICD10-CM R00.0, Discharge, Medical)   Plan   Condition:  Stable.    Disposition: Discharged: Time  06/19/2020 22:05:00, to home.    Prescriptions   Patient was given the following educational materials: Pt. education, Sinusitis, Adult, Easy-to-Read, Sinus Tachycardia, Hypertension, Adult, Easy-to-Read, Hypertension, Adult, Easy-to-Read, Sinus Tachycardia, Sinusitis, Adult, Easy-to-Read.    Follow up with: [facility] Follow-up, [facility] Within 1 week, only if needed.    Counseled: I had a detailed discussion with the patient and/or guardian regarding the historical points/exam findings supporting the discharge diagnosis and need for outpatient followup. Discussed the need to return to the ER if symptoms persist/worsen, or for any questions/concerns that arise at home.

## 2020-06-19 NOTE — ED Notes (Signed)
 ED Patient Summary       ;       [facility] Emergency Department  [address] [place] [address]  [phone]  Discharge Instructions (Patient)  Name: Margaret Carrillo, Margaret Carrillo  DOB:  October 27, 1980                   MRN: [id number]                   FIN: [id number]  Reason For Visit: Sinus Pain/Congestion; SINUS PRESSUE AND DRAINAGE  Final Diagnosis: Acute maxillary sinusitis; Sinus tachycardia     Visit Date: 06/19/2020 20:20:00  Address: [address] [place] [address]  Phone: [phone]     Emergency Department Providers:         Primary Physician:      EZZARD SIEVING [facility] would like to thank you for allowing us  to assist you with your healthcare needs. The following includes patient education materials and information regarding your injury/illness.     Follow-up Instructions:  You were seen today on an emergency basis. Please contact your primary care doctor for a follow up appointment. If you received a referral to a specialist doctor, it is important you follow-up as instructed.    It is important that you call your follow-up doctor to schedule and confirm the location of your next appointment. Your doctor may practice at multiple locations. The office location of your follow-up appointment may be different to the one written on your discharge instructions.    If you do not have a primary care doctor, please call [phone] for help in finding a [facility] Provider. For help in finding a specialist doctor, please call [phone].    If your condition gets worse before your follow-up with your primary care doctor or specialist, please return to the Emergency Department.      Coronavirus 2019 (COVID-19) Reminders:     Patients age 87 - 56, with parental consent, and patients over age 61 can make an appointment for a COVID-19 vaccine. Patients can contact their [facility] doctors' offices to schedule an appointment to receive  the COVID-19 vaccine. Patients who do not have a [facility] physician can call [phone] to schedule vaccination appointments.      Follow Up Appointments:  Primary Care Provider:     Name: PCP,  NONE     Phone:                  With: Address: When:   [facility] Call for appt & office location   [phone] Business (1) Within 1 week, only if needed              Post [facility]%>          New Medications  Printed Prescriptions  azithromycin (Zithromax Z-Pak 250 mg oral tablet) 1 Packets Oral (given by mouth) every day for 5 Days. as directed on package labeling. Refills: 0.  Last Dose:____________________  metoprolol (metoprolol succinate 50 mg oral tablet, extended release) 1 Tabs Oral (given by mouth) every day. Refills: 0.  Last Dose:____________________  Medications that have not changed  Other Medications  promethazine   Last Dose:____________________  SUMAtriptan (SUMAtriptan 20 mg/inh nasal spray) 1 Sprays Nasal (into the nose) once as needed for migraine headache.  Last Dose:____________________  topiramate (topiramate 25 mg oral capsule) Oral (  given by mouth) 2 times a day.  Last Dose:____________________      Allergy Info: No Known Allergies     Discharge Additional Information          Discharge Patient 06/19/20 22:18:00 EDT      Patient Education Materials:        Hypertension, Adult    Hypertension is another name for high blood pressure. High blood pressure forces your heart to work harder to pump blood. This can cause problems over time.    There are two numbers in a blood pressure reading. There is a top number (systolic) over a bottom number (diastolic). It is best to have a blood pressure that is below 120/80. Healthy choices can help lower your blood pressure, or you may need medicine to help lower it.      What are the causes?    The cause of this condition is not known. Some conditions may be related to high blood pressure.      What increases the  risk?     Smoking.     Having type 2 diabetes mellitus, high cholesterol, or both.     Not getting enough exercise or physical activity.     Being overweight.     Having too much fat, sugar, calories, or salt (sodium) in your diet.     Drinking too much alcohol.     Having long-term (chronic) kidney disease.     Having a family history of high blood pressure.     Age. Risk increases with age.     Race. You may be at higher risk if you are African American.     Gender. Men are at higher risk than women before age 18. After age 34, women are at higher risk than men.     Having obstructive sleep apnea.     Stress.      What are the signs or symptoms?     High blood pressure may not cause symptoms. Very high blood pressure (hypertensive crisis) may cause:  ? Headache.    ? Feelings of worry or nervousness (anxiety).    ? Shortness of breath.    ? Nosebleed.    ? A feeling of being sick to your stomach (nausea).    ? Throwing up (vomiting).    ? Changes in how you see.    ? Very bad chest pain.    ? Seizures.        How is this treated?     This condition is treated by making healthy lifestyle changes, such as:  ? Eating healthy foods.    ? Exercising more.    ? Drinking less alcohol.       Your health care provider may prescribe medicine if lifestyle changes are not enough to get your blood pressure under control, and if:  ? Your top number is above 130.    ? Your bottom number is above 80.       Your personal target blood pressure may vary.      Follow these instructions at home:    Eating and drinking       If told, follow the DASH eating plan. To follow this plan:  ? Fill one half of your plate at each meal with fruits and vegetables.    ? Fill one fourth of your plate at each meal with whole grains. Whole grains include whole-wheat pasta, brown rice, and whole-grain bread.    ? Eat  or drink low-fat dairy products, such as skim milk or low-fat yogurt.    ? Fill one fourth of your plate at each meal with low-fat  (lean) proteins. Low-fat proteins include fish, chicken without skin, eggs, beans, and tofu.    ? Avoid fatty meat, cured and processed meat, or chicken with skin.    ? Avoid pre-made or processed food.       Eat less than 1,500 mg of salt each day.     Do not drink alcohol if:  ? Your doctor tells you not to drink.    ? You are pregnant, may be pregnant, or are planning to become pregnant.       If you drink alcohol:  ? Limit how much you use to:  ? 0?1 drink a day for women.    ? 0?2 drinks a day for men.      ? Be aware of how much alcohol is in your drink. In the [place] one drink equals one 12 oz bottle of beer (355 mL), one 5 oz glass of wine (148 mL), or one 1? oz glass of hard liquor (44 mL).        Lifestyle       Work with your doctor to stay at a healthy weight or to lose weight. Ask your doctor what the best weight is for you.     Get at least 30 minutes of exercise most days of the week. This may include walking, swimming, or biking.     Get at least 30 minutes of exercise that strengthens your muscles (resistance exercise) at least 3 days a week. This may include lifting weights or doing Pilates.     Do not use any products that contain nicotine or tobacco, such as cigarettes, e-cigarettes, and chewing tobacco. If you need help quitting, ask your doctor.     Check your blood pressure at home as told by your doctor.     Keep all follow-up visits as told by your doctor. This is important.      Medicines     Take over-the-counter and prescription medicines only as told by your doctor. Follow directions carefully.     Do not skip doses of blood pressure medicine. The medicine does not work as well if you skip doses. Skipping doses also puts you at risk for problems.     Ask your doctor about side effects or reactions to medicines that you should watch for.        Contact a doctor if you:     Think you are having a reaction to the medicine you are taking.     Have headaches that keep coming back  (recurring).     Feel dizzy.     Have swelling in your ankles.     Have trouble with your vision.      Get help right away if you:     Get a very bad headache.     Start to feel mixed up (confused).     Feel weak or numb.     Feel faint.     Have very bad pain in your:  ? Chest.    ? Belly (abdomen).       Throw up more than once.     Have trouble breathing.      Summary     Hypertension is another name for high blood pressure.     High blood pressure forces your heart to work  harder to pump blood.     For most people, a normal blood pressure is less than 120/80.     Making healthy choices can help lower blood pressure. If your blood pressure does not get lower with healthy choices, you may need to take medicine.      This information is not intended to replace advice given to you by your health care provider. Make sure you discuss any questions you have with your health care provider.      Document Revised: 10/20/2017 Document Reviewed: 10/20/2017  Elsevier Patient Education ? 2021 Elsevier Inc.       Sinus Tachycardia      Sinus tachycardia is a kind of fast heartbeat. In sinus tachycardia, the heart beats more than 100 times a minute. Sinus tachycardia starts in a part of the heart called the sinus node. Sinus tachycardia may be harmless, or it may be a sign of a serious condition.      What are the causes?    This condition may be caused by:   Exercise or exertion.     A fever.     Pain.     Loss of body fluids (dehydration).     Severe bleeding (hemorrhage).     Anxiety and stress.     Certain substances, including:  ? Alcohol.    ? Caffeine.    ? Tobacco and nicotine products.    ? Cold medicines.    ? Illegal drugs.       Medical conditions including:  ? Heart disease.    ? An infection.    ? An overactive thyroid (hyperthyroidism).    ? A lack of red blood cells (anemia).          What are the signs or symptoms?    Symptoms of this condition include:   A feeling that the heart is beating quickly  (palpitations).     Suddenly noticing your heartbeat (cardiac awareness).     Dizziness.     Tiredness (fatigue).     Shortness of breath.     Chest pain.     Nausea.     Fainting.        How is this diagnosed?    This condition is diagnosed with:   A physical exam.     Other tests, such as:  ? Blood tests.    ? An electrocardiogram (ECG). This test measures the electrical activity of the heart.    ? Ambulatory cardiac monitor. This records your heartbeats for 24 hours or more.        You may be referred to a heart specialist (cardiologist).      How is this treated?    Treatment for this condition depends on the cause or the underlying condition. Treatment may involve:   Treating the underlying condition.     Taking new medicines or changing your current medicines as told by your health care provider.     Making changes to your diet or lifestyle.        Follow these instructions at home:    Lifestyle       Do not use any products that contain nicotine or tobacco, such as cigarettes and e-cigarettes. If you need help quitting, ask your health care provider.     Do not use illegal drugs, such as cocaine.     Learn relaxation methods to help you when you get stressed or anxious. These include deep breathing.  Avoid caffeine or other stimulants.      Alcohol use       Do not drink alcohol if:  ? Your health care provider tells you not to drink.    ? You are pregnant, may be pregnant, or are planning to become pregnant.       If you drink alcohol, limit how much you have:  ? 0?1 drink a day for women.    ? 0?2 drinks a day for men.       Be aware of how much alcohol is in your drink. In the [place] one drink equals one typical bottle of beer (12 oz), one-half glass of wine (5 oz), or one shot of hard liquor (1? oz).      General instructions     Drink enough fluids to keep your urine pale yellow.     Take over-the-counter and prescription medicines only as told by your health care provider.     Keep all follow-up  visits as told by your health care provider. This is important.        Contact a health care provider if you have:     A fever.     Vomiting or diarrhea that does not go away.      Get help right away if you:     Have pain in your chest, upper arms, jaw, or neck.     Become weak or dizzy.     Feel faint.     Have palpitations that do not go away.      Summary     In sinus tachycardia, the heart beats more than 100 times a minute.     Sinus tachycardia may be harmless, or it may be a sign of a serious condition.     Treatment for this condition depends on the cause or the underlying condition.     Get help right away if you have pain in your chest, upper arms, jaw, or neck.      This information is not intended to replace advice given to you by your health care provider. Make sure you discuss any questions you have with your health care provider.      Document Revised: 03/31/2017 Document Reviewed: 03/31/2017  Elsevier Patient Education ? 2021 Elsevier Inc.       Sinusitis, Adult      Sinusitis is soreness and swelling (inflammation) of your sinuses. Sinuses are hollow spaces in the bones around your face. They are located:   Around your eyes.     In the middle of your forehead.     Behind your nose.     In your cheekbones.      Your sinuses and nasal passages are lined with a fluid called mucus. Mucus drains out of your sinuses. Swelling can trap mucus in your sinuses. This lets germs (bacteria, virus, or fungus) grow, which leads to infection. Most of the time, this condition is caused by a virus.      What are the causes?    This condition is caused by:   Allergies.      Asthma.      Germs.     Things that block your nose or sinuses.     Growths in the nose (nasal polyps).     Chemicals or irritants in the air.     Fungus (rare).        What increases the risk?    You are more likely to  develop this condition if:   You have a weak body defense system (immune system).     You do a lot of swimming or diving.     You  use nasal sprays too much.     You smoke.        What are the signs or symptoms?    The main symptoms of this condition are pain and a feeling of pressure around the sinuses. Other symptoms include:   Stuffy nose (congestion).     Runny nose (drainage).     Swelling and warmth in the sinuses.     Headache.      Toothache.     A cough that may get worse at night.     Mucus that collects in the throat or the back of the nose (postnasal drip).     Being unable to smell and taste.     Being very tired (fatigue).     A fever.     Sore throat.     Bad breath.         How is this diagnosed?    This condition is diagnosed based on:   Your symptoms.      Your medical history.      A physical exam.      Tests to find out if your condition is short-term (acute) or long-term (chronic). Your doctor may:  ? Check your nose for growths (polyps).    ? Check your sinuses using a tool that has a light (endoscope).    ? Check for allergies or germs.    ? Do imaging tests, such as an MRI or CT scan.          How is this treated?    Treatment for this condition depends on the cause and whether it is short-term or long-term.   If caused by a virus, your symptoms should go away on their own within 10 days. You may be given medicines to relieve symptoms. They include:  ? Medicines that shrink swollen tissue in the nose.     ? Medicines that treat allergies (antihistamines).     ? A spray that treats swelling of the nostrils.?    ? Rinses that help get rid of thick mucus in your nose (nasal saline washes).        If caused by bacteria, your doctor may wait to see if you will get better without treatment. You may be given antibiotic medicine if you have:  ? A very bad infection.    ? A weak body defense system.       If caused by growths in the nose, you may need to have surgery.        Follow these instructions at home:    Medicines     Take, use, or apply over-the-counter and prescription medicines only as told by your doctor. These may  include nasal sprays.     If you were prescribed an antibiotic medicine, take it as told by your doctor. Do not stop taking the antibiotic even if you start to feel better.      Hydrate and humidify       Drink enough water to keep your pee (urine) pale yellow.     Use a cool mist humidifier to keep the humidity level in your home above 50%.     Breathe in steam for 10?15 minutes, 3?4 times a day, or as told by your doctor. You can do  this in the bathroom while a hot shower is running.     Try not to spend time in cool or dry air.      Rest     Rest as much as you can.     Sleep with your head raised (elevated).     Make sure you get enough sleep each night.      General instructions       Put a warm, moist washcloth on your face 3?4 times a day, or as often as told by your doctor. This will help with discomfort.     Wash your hands often with soap and water. If there is no soap and water, use hand sanitizer.     Do not smoke. Avoid being around people who are smoking (secondhand smoke).     Keep all follow-up visits as told by your doctor. This is important.        Contact a doctor if:     You have a fever.     Your symptoms get worse.     Your symptoms do not get better within 10 days.      Get help right away if:     You have a very bad headache.     You cannot stop throwing up (vomiting).     You have very bad pain or swelling around your face or eyes.     You have trouble seeing.     You feel confused.     Your neck is stiff.     You have trouble breathing.      Summary     Sinusitis is swelling of your sinuses. Sinuses are hollow spaces in the bones around your face.     This condition is caused by tissues in your nose that become inflamed or swollen. This traps germs. These can lead to infection.     If you were prescribed an antibiotic medicine, take it as told by your doctor. Do not stop taking it even if you start to feel better.     Keep all follow-up visits as told by your doctor. This is important.       This information is not intended to replace advice given to you by your health care provider. Make sure you discuss any questions you have with your health care provider.      Document Revised: 07/12/2017 Document Reviewed: 07/12/2017  Elsevier Patient Education ? 2021 Elsevier Inc.      ---------------------------------------------------------------------------------------------------------------------  [facility] allows patients to review your COVID and other test results as well as discharge documents from any [facility], Emergency Department, surgical center or outpatient lab. Test results are typically available 36 hours after the test is completed.     [facility] encourages you to self-enroll in the [facility] Patient Portal.     To begin your self-enrollment process, please visit [email or web address] Under [facility], click on "Sign up now".     NOTE: You must be 16 years and older to use [facility] Self-Enroll online. If you are a parent, caregiver, or guardian; you need an invite to access your child's or dependent's health records. To obtain an invite, contact the Medical Records department at [phone] Monday through Friday, 8-4:30, select option 3 . If we receive your call afterhours, we will return your call the next business day.     If you have issues trying to create or access your account, contact [employer]  support at [phone] available 7 days a week 24 hours a day.     Comment:

## 2020-06-20 LAB — LACTIC ACID: Lactic Acid: 1.1 mmol/L (ref 0.5–2.0)

## 2020-06-20 LAB — BASIC METABOLIC PANEL
Anion Gap: 9 mmol/L (ref 2–17)
BUN: 9 mg/dL (ref 6–20)
CO2: 24 mmol/L (ref 22–29)
Calcium: 9.1 mg/dL (ref 8.6–10.0)
Chloride: 105 mmol/L (ref 98–107)
Creatinine: 0.7 mg/dL (ref 0.5–1.0)
GFR African American: 127 mL/min/{1.73_m2} (ref >=90)
GFR Non-African American: 110 mL/min/{1.73_m2} (ref >=90)
Glucose: 112 mg/dL — ABNORMAL HIGH (ref 70–99)
OSMOLALITY CALCULATED: 275 mOsm/kg (ref 270–287)
Potassium: 3.8 mmol/L (ref 3.5–5.3)
Sodium: 138 mmol/L (ref 135–145)

## 2020-06-20 LAB — CBC WITH AUTO DIFFERENTIAL
Absolute Baso #: 0 10*3/uL (ref 0.0–0.2)
Absolute Eos #: 0.3 10*3/uL (ref 0.0–0.5)
Absolute Lymph #: 2.4 10*3/uL (ref 1.0–3.2)
Absolute Mono #: 0.6 10*3/uL (ref 0.3–1.0)
Basophils %: 0.5 % (ref 0.0–2.0)
Eosinophils %: 3.3 % (ref 0.0–7.0)
Hematocrit: 42.2 % (ref 34.0–47.0)
Hemoglobin: 14.2 g/dL (ref 11.5–15.7)
Immature Grans (Abs): 0.01 10*3/uL (ref 0.00–0.06)
Immature Granulocytes: 0.1 % (ref 0.0–0.6)
Lymphocytes: 27.8 % (ref 15.0–45.0)
MCH: 30.5 pg (ref 27.0–34.5)
MCHC: 33.6 g/dL (ref 32.0–36.0)
MCV: 90.8 fL (ref 81.0–99.0)
MPV: 10.7 fL (ref 7.2–13.2)
Monocytes: 7.3 % (ref 4.0–12.0)
NRBC Absolute: 0 10*3/uL (ref 0.000–0.012)
NRBC Automated: 0 % (ref 0.0–0.2)
Neutrophils %: 61 % (ref 42.0–74.0)
Neutrophils Absolute: 5.3 10*3/uL (ref 1.6–7.3)
Platelets: 296 10*3/uL (ref 140–440)
RBC: 4.65 x10e6/mcL (ref 3.60–5.20)
RDW: 14.3 % (ref 11.0–16.0)
WBC: 8.7 10*3/uL (ref 3.8–10.6)

## 2020-06-20 LAB — TSH: TSH, 3RD GENERATION: 2.44 mcIU/mL (ref 0.358–3.740)

## 2022-08-27 ENCOUNTER — Emergency Department: Payer: Medicaid Other

## 2022-08-27 ENCOUNTER — Inpatient Hospital Stay: Payer: Medicaid Other | Admitting: Registered Nurse

## 2022-08-27 ENCOUNTER — Inpatient Hospital Stay
Admission: EM | Admit: 2022-08-27 | Discharge: 2022-08-30 | DRG: 345 | Disposition: A | Discharge status: Home / Self Care | Payer: Medicaid Other | Attending: General Surgery | Admitting: General Surgery

## 2022-08-27 ENCOUNTER — Encounter
Admission: EM | Disposition: A | Discharge status: Home / Self Care | Payer: Self-pay | Source: Home / Self Care | Attending: Hospitalist

## 2022-08-27 ENCOUNTER — Other Ambulatory Visit: Payer: Self-pay

## 2022-08-27 ENCOUNTER — Encounter: Payer: Self-pay | Admitting: Emergency Medicine

## 2022-08-27 DIAGNOSIS — R1115 Cyclical vomiting syndrome unrelated to migraine: Secondary | ICD-10-CM | POA: Diagnosis present

## 2022-08-27 DIAGNOSIS — R000 Tachycardia, unspecified: Secondary | ICD-10-CM | POA: Diagnosis present

## 2022-08-27 DIAGNOSIS — Z6838 Body mass index (BMI) 38.0-38.9, adult: Secondary | ICD-10-CM

## 2022-08-27 DIAGNOSIS — I10 Essential (primary) hypertension: Secondary | ICD-10-CM | POA: Diagnosis present

## 2022-08-27 DIAGNOSIS — E8722 Chronic metabolic acidosis: Secondary | ICD-10-CM | POA: Diagnosis present

## 2022-08-27 DIAGNOSIS — Z791 Long term (current) use of non-steroidal anti-inflammatories (NSAID): Secondary | ICD-10-CM | POA: Diagnosis not present

## 2022-08-27 DIAGNOSIS — Z79899 Other long term (current) drug therapy: Secondary | ICD-10-CM

## 2022-08-27 DIAGNOSIS — K6289 Other specified diseases of anus and rectum: Secondary | ICD-10-CM

## 2022-08-27 DIAGNOSIS — Z9049 Acquired absence of other specified parts of digestive tract: Secondary | ICD-10-CM

## 2022-08-27 DIAGNOSIS — F17210 Nicotine dependence, cigarettes, uncomplicated: Secondary | ICD-10-CM | POA: Diagnosis present

## 2022-08-27 DIAGNOSIS — R112 Nausea with vomiting, unspecified: Secondary | ICD-10-CM | POA: Diagnosis not present

## 2022-08-27 DIAGNOSIS — E872 Acidosis, unspecified: Secondary | ICD-10-CM

## 2022-08-27 DIAGNOSIS — E878 Other disorders of electrolyte and fluid balance, not elsewhere classified: Secondary | ICD-10-CM | POA: Diagnosis present

## 2022-08-27 DIAGNOSIS — K612 Anorectal abscess: Secondary | ICD-10-CM | POA: Diagnosis not present

## 2022-08-27 DIAGNOSIS — L0291 Cutaneous abscess, unspecified: Principal | ICD-10-CM

## 2022-08-27 DIAGNOSIS — K611 Rectal abscess: Secondary | ICD-10-CM | POA: Diagnosis not present

## 2022-08-27 DIAGNOSIS — Z8249 Family history of ischemic heart disease and other diseases of the circulatory system: Secondary | ICD-10-CM

## 2022-08-27 DIAGNOSIS — K640 First degree hemorrhoids: Secondary | ICD-10-CM | POA: Diagnosis present

## 2022-08-27 HISTORY — PX: INCISION AND DRAINAGE PERIRECTAL ABSCESS: SHX1804

## 2022-08-27 LAB — COMPREHENSIVE METABOLIC PANEL
ALT: 28 U/L (ref 0–44)
AST: 20 U/L (ref 15–41)
Albumin: 3.7 g/dL (ref 3.5–5.0)
Alkaline Phosphatase: 86 U/L (ref 38–126)
Anion gap: 9 (ref 5–15)
BUN: 14 mg/dL (ref 6–20)
CO2: 15 mmol/L — ABNORMAL LOW (ref 22–32)
Calcium: 8.8 mg/dL — ABNORMAL LOW (ref 8.9–10.3)
Chloride: 114 mmol/L — ABNORMAL HIGH (ref 98–111)
Creatinine, Ser: 0.61 mg/dL (ref 0.44–1.00)
GFR, Estimated: >60 mL/min (ref >60)
Glucose, Bld: 95 mg/dL (ref 70–99)
Potassium: 3.9 mmol/L (ref 3.5–5.1)
Sodium: 138 mmol/L (ref 135–145)
Total Bilirubin: 0.7 mg/dL (ref 0.3–1.2)
Total Protein: 7.2 g/dL (ref 6.5–8.1)

## 2022-08-27 LAB — CBC WITH DIFFERENTIAL/PLATELET
Abs Immature Granulocytes: 0.05 10*3/uL (ref 0.00–0.07)
Basophils Absolute: 0 10*3/uL (ref 0.0–0.1)
Basophils Relative: 0 %
Eosinophils Absolute: 0.1 10*3/uL (ref 0.0–0.5)
Eosinophils Relative: 1 %
HCT: 43.5 % (ref 36.0–46.0)
Hemoglobin: 14.1 g/dL (ref 12.0–15.0)
Immature Granulocytes: 0 %
Lymphocytes Relative: 17 %
Lymphs Abs: 2.5 10*3/uL (ref 0.7–4.0)
MCH: 30.5 pg (ref 26.0–34.0)
MCHC: 32.4 g/dL (ref 30.0–36.0)
MCV: 94.2 fL (ref 80.0–100.0)
Monocytes Absolute: 0.8 10*3/uL (ref 0.1–1.0)
Monocytes Relative: 5 %
Neutro Abs: 11.1 10*3/uL — ABNORMAL HIGH (ref 1.7–7.7)
Neutrophils Relative %: 77 %
Platelets: 343 10*3/uL (ref 150–400)
RBC: 4.62 MIL/uL (ref 3.87–5.11)
RDW: 13.9 % (ref 11.5–15.5)
WBC: 14.5 10*3/uL — ABNORMAL HIGH (ref 4.0–10.5)
nRBC: 0 % (ref 0.0–0.2)

## 2022-08-27 LAB — AEROBIC/ANAEROBIC CULTURE W GRAM STAIN (SURGICAL/DEEP WOUND)

## 2022-08-27 LAB — POCT URINE PREGNANCY: Preg Test, Ur: NEGATIVE

## 2022-08-27 SURGERY — EXAM UNDER ANESTHESIA
Anesthesia: General | Site: Rectum

## 2022-08-27 MED ORDER — DEXAMETHASONE SODIUM PHOSPHATE 10 MG/ML IJ SOLN
INTRAMUSCULAR | Status: DC | PRN
  Administered 2022-08-27: 5 mg via INTRAVENOUS

## 2022-08-27 MED ORDER — NITROGLYCERIN IN D5W 200-5 MCG/ML-% IV SOLN
INTRAVENOUS | Status: AC
  Filled 2022-08-27: qty 250

## 2022-08-27 MED ORDER — ACETAMINOPHEN 325 MG PO TABS: 650.0000 mg | ORAL_TABLET | Freq: Four times a day (QID) | ORAL | Status: DC | PRN

## 2022-08-27 MED ORDER — ACETAMINOPHEN 10 MG/ML IV SOLN: 1000.0000 mg | Freq: Once | INTRAVENOUS | Status: DC | PRN

## 2022-08-27 MED ORDER — KETOROLAC TROMETHAMINE 30 MG/ML IJ SOLN: INTRAMUSCULAR | Status: DC | PRN

## 2022-08-27 MED ORDER — OXYCODONE HCL 5 MG PO TABS
ORAL_TABLET | ORAL | Status: AC
  Filled 2022-08-27: qty 1

## 2022-08-27 MED ORDER — ONDANSETRON HCL 4 MG/2ML IJ SOLN
4.0000 mg | Freq: Once | INTRAMUSCULAR | Status: AC
  Administered 2022-08-27: 4 mg via INTRAVENOUS
  Filled 2022-08-27: qty 2

## 2022-08-27 MED ORDER — ROCURONIUM BROMIDE 10 MG/ML (PF) SYRINGE
PREFILLED_SYRINGE | INTRAVENOUS | Status: AC
  Filled 2022-08-27: qty 10

## 2022-08-27 MED ORDER — MIDAZOLAM HCL 2 MG/2ML IJ SOLN
INTRAMUSCULAR | Status: AC
  Filled 2022-08-27: qty 2

## 2022-08-27 MED ORDER — OXYCODONE HCL 5 MG PO TABS
5.0000 mg | ORAL_TABLET | ORAL | Status: DC | PRN
  Administered 2022-08-28 – 2022-08-30 (×3): 10 mg via ORAL
  Filled 2022-08-27 (×3): qty 2

## 2022-08-27 MED ORDER — BUPIVACAINE-EPINEPHRINE (PF) 0.25% -1:200000 IJ SOLN
INTRAMUSCULAR | Status: AC
  Filled 2022-08-27: qty 30

## 2022-08-27 MED ORDER — ACETAMINOPHEN 650 MG RE SUPP: 650.0000 mg | Freq: Four times a day (QID) | RECTAL | Status: DC | PRN

## 2022-08-27 MED ORDER — FENTANYL CITRATE PF 50 MCG/ML IJ SOSY
50.0000 ug | PREFILLED_SYRINGE | Freq: Once | INTRAMUSCULAR | Status: AC
  Administered 2022-08-27: 50 ug via INTRAVENOUS
  Filled 2022-08-27: qty 1

## 2022-08-27 MED ORDER — MIDAZOLAM HCL 2 MG/2ML IJ SOLN
INTRAMUSCULAR | Status: DC | PRN
  Administered 2022-08-27: 2 mg via INTRAVENOUS

## 2022-08-27 MED ORDER — NICARDIPINE HCL IN NACL 20-0.86 MG/200ML-% IV SOLN
INTRAVENOUS | Status: AC
  Filled 2022-08-27: qty 200

## 2022-08-27 MED ORDER — FENTANYL CITRATE (PF) 100 MCG/2ML IJ SOLN
INTRAMUSCULAR | Status: AC
  Filled 2022-08-27: qty 2

## 2022-08-27 MED ORDER — IOHEXOL 300 MG/ML  SOLN
100.0000 mL | Freq: Once | INTRAMUSCULAR | Status: AC | PRN
  Administered 2022-08-27: 100 mL via INTRAVENOUS

## 2022-08-27 MED ORDER — PROPOFOL 10 MG/ML IV BOLUS
INTRAVENOUS | Status: DC | PRN
  Administered 2022-08-27: 200 mg via INTRAVENOUS

## 2022-08-27 MED ORDER — PIPERACILLIN-TAZOBACTAM 3.375 G IVPB
3.3750 g | Freq: Three times a day (TID) | INTRAVENOUS | Status: DC
  Administered 2022-08-27 – 2022-08-30 (×8): 3.375 g via INTRAVENOUS
  Filled 2022-08-27 (×8): qty 50

## 2022-08-27 MED ORDER — ACETAMINOPHEN 10 MG/ML IV SOLN
INTRAVENOUS | Status: DC | PRN
  Administered 2022-08-27: 1000 mg via INTRAVENOUS

## 2022-08-27 MED ORDER — LIDOCAINE HCL (PF) 2 % IJ SOLN
INTRAMUSCULAR | Status: AC
  Filled 2022-08-27: qty 5

## 2022-08-27 MED ORDER — ATORVASTATIN CALCIUM 20 MG PO TABS
20.0000 mg | ORAL_TABLET | Freq: Every day | ORAL | Status: DC
  Administered 2022-08-29 – 2022-08-30 (×2): 20 mg via ORAL
  Filled 2022-08-27 (×3): qty 1

## 2022-08-27 MED ORDER — MORPHINE SULFATE (PF) 2 MG/ML IV SOLN
1.0000 mg | INTRAVENOUS | Status: DC | PRN
  Administered 2022-08-27 – 2022-08-28 (×2): 1 mg via INTRAVENOUS
  Filled 2022-08-27 (×2): qty 1

## 2022-08-27 MED ORDER — SODIUM CHLORIDE 0.9 % IV BOLUS
1000.0000 mL | Freq: Once | INTRAVENOUS | Status: AC
  Administered 2022-08-27: 1000 mL via INTRAVENOUS

## 2022-08-27 MED ORDER — OXYCODONE HCL 5 MG PO TABS
5.0000 mg | ORAL_TABLET | Freq: Once | ORAL | Status: AC | PRN
  Administered 2022-08-27: 5 mg via ORAL

## 2022-08-27 MED ORDER — SUGAMMADEX SODIUM 200 MG/2ML IV SOLN
INTRAVENOUS | Status: DC | PRN
  Administered 2022-08-27 (×2): 200 mg via INTRAVENOUS

## 2022-08-27 MED ORDER — ONDANSETRON HCL 4 MG/2ML IJ SOLN
INTRAMUSCULAR | Status: DC | PRN
  Administered 2022-08-27: 4 mg via INTRAVENOUS

## 2022-08-27 MED ORDER — FENTANYL CITRATE (PF) 100 MCG/2ML IJ SOLN
25.0000 ug | INTRAMUSCULAR | Status: DC | PRN
  Administered 2022-08-27 (×4): 50 ug via INTRAVENOUS

## 2022-08-27 MED ORDER — LIDOCAINE HCL (PF) 1 % IJ SOLN
INTRAMUSCULAR | Status: AC
  Filled 2022-08-27: qty 30

## 2022-08-27 MED ORDER — METOPROLOL SUCCINATE ER 50 MG PO TB24
50.0000 mg | ORAL_TABLET | Freq: Every day | ORAL | Status: DC
  Administered 2022-08-28 – 2022-08-30 (×3): 50 mg via ORAL
  Filled 2022-08-27 (×3): qty 1

## 2022-08-27 MED ORDER — TOPIRAMATE 25 MG PO TABS
25.0000 mg | ORAL_TABLET | Freq: Two times a day (BID) | ORAL | Status: DC
  Administered 2022-08-27 – 2022-08-30 (×6): 25 mg via ORAL
  Filled 2022-08-27 (×6): qty 1

## 2022-08-27 MED ORDER — PIPERACILLIN-TAZOBACTAM 3.375 G IVPB 30 MIN
3.3750 g | Freq: Once | INTRAVENOUS | Status: AC
  Administered 2022-08-27: 3.375 g via INTRAVENOUS
  Filled 2022-08-27: qty 50

## 2022-08-27 MED ORDER — 0.9 % SODIUM CHLORIDE (POUR BTL) OPTIME
TOPICAL | Status: DC | PRN
  Administered 2022-08-27: 500 mL

## 2022-08-27 MED ORDER — FENTANYL CITRATE (PF) 100 MCG/2ML IJ SOLN
INTRAMUSCULAR | Status: DC | PRN
  Administered 2022-08-27: 100 ug via INTRAVENOUS

## 2022-08-27 MED ORDER — MORPHINE SULFATE (PF) 4 MG/ML IV SOLN
4.0000 mg | Freq: Once | INTRAVENOUS | Status: AC
  Administered 2022-08-27: 4 mg via INTRAVENOUS
  Filled 2022-08-27: qty 1

## 2022-08-27 MED ORDER — DEXAMETHASONE SODIUM PHOSPHATE 10 MG/ML IJ SOLN
INTRAMUSCULAR | Status: AC
  Filled 2022-08-27: qty 1

## 2022-08-27 MED ORDER — DROPERIDOL 2.5 MG/ML IJ SOLN: 0.6250 mg | Freq: Once | INTRAMUSCULAR | Status: DC | PRN

## 2022-08-27 MED ORDER — LIDOCAINE HCL (CARDIAC) PF 100 MG/5ML IV SOSY
PREFILLED_SYRINGE | INTRAVENOUS | Status: DC | PRN
  Administered 2022-08-27: 100 mg via INTRAVENOUS

## 2022-08-27 MED ORDER — ONDANSETRON HCL 4 MG PO TABS: 4.0000 mg | ORAL_TABLET | Freq: Four times a day (QID) | ORAL | Status: DC | PRN

## 2022-08-27 MED ORDER — LIDOCAINE HCL 1 % IJ SOLN: INTRAMUSCULAR | Status: DC | PRN

## 2022-08-27 MED ORDER — ENOXAPARIN SODIUM 60 MG/0.6ML IJ SOSY
50.0000 mg | PREFILLED_SYRINGE | INTRAMUSCULAR | Status: DC
  Administered 2022-08-28 – 2022-08-30 (×3): 50 mg via SUBCUTANEOUS
  Filled 2022-08-27 (×3): qty 0.6

## 2022-08-27 MED ORDER — HYDROMORPHONE HCL 1 MG/ML IJ SOLN
0.5000 mg | INTRAMUSCULAR | Status: DC | PRN
  Administered 2022-08-27: 1 mg via INTRAVENOUS
  Filled 2022-08-27: qty 1

## 2022-08-27 MED ORDER — TRAMADOL HCL 50 MG PO TABS
50.0000 mg | ORAL_TABLET | Freq: Four times a day (QID) | ORAL | Status: DC | PRN
  Administered 2022-08-28: 50 mg via ORAL
  Filled 2022-08-27: qty 1

## 2022-08-27 MED ORDER — LACTATED RINGERS IV SOLN: INTRAVENOUS | Status: DC | PRN

## 2022-08-27 MED ORDER — IBUPROFEN 400 MG PO TABS
600.0000 mg | ORAL_TABLET | Freq: Four times a day (QID) | ORAL | Status: DC | PRN
  Administered 2022-08-29: 600 mg via ORAL
  Filled 2022-08-27: qty 2

## 2022-08-27 MED ORDER — OXYCODONE HCL 5 MG/5ML PO SOLN: 5.0000 mg | Freq: Once | ORAL | Status: AC | PRN

## 2022-08-27 MED ORDER — MICROFIBRILLAR COLL HEMOSTAT EX PADS
MEDICATED_PAD | CUTANEOUS | Status: DC | PRN
  Administered 2022-08-27: 1 via TOPICAL

## 2022-08-27 MED ORDER — PROMETHAZINE HCL 25 MG/ML IJ SOLN: 6.2500 mg | INTRAMUSCULAR | Status: DC | PRN

## 2022-08-27 MED ORDER — ONDANSETRON HCL 4 MG/2ML IJ SOLN: 4.0000 mg | Freq: Four times a day (QID) | INTRAMUSCULAR | Status: DC | PRN

## 2022-08-27 MED ORDER — ACETAMINOPHEN 10 MG/ML IV SOLN
INTRAVENOUS | Status: AC
  Filled 2022-08-27: qty 100

## 2022-08-27 MED ORDER — SODIUM CHLORIDE 0.9% FLUSH
3.0000 mL | Freq: Two times a day (BID) | INTRAVENOUS | Status: DC
  Administered 2022-08-27 – 2022-08-30 (×6): 3 mL via INTRAVENOUS

## 2022-08-27 MED ORDER — ROCURONIUM BROMIDE 100 MG/10ML IV SOLN
INTRAVENOUS | Status: DC | PRN
  Administered 2022-08-27: 60 mg via INTRAVENOUS

## 2022-08-27 MED ORDER — DEXMEDETOMIDINE HCL IN NACL 80 MCG/20ML IV SOLN
INTRAVENOUS | Status: DC | PRN
  Administered 2022-08-27: 20 ug via INTRAVENOUS

## 2022-08-27 SURGICAL SUPPLY — 50 items
BLADE SURG 15 STRL LF DISP TIS (BLADE) ×1 IMPLANT
BLADE SURG 15 STRL SS (BLADE) ×1
BNDG GAUZE DERMACEA FLUFF 4 (GAUZE/BANDAGES/DRESSINGS) IMPLANT
BNDG GZE DERMACEA 4 6PLY (GAUZE/BANDAGES/DRESSINGS)
BRIEF MESH DISP 2XL (UNDERPADS AND DIAPERS) ×1 IMPLANT
DRAIN PENROSE 0.625X18 (DRAIN) IMPLANT
DRAIN PENROSE 12X.25 LTX STRL (MISCELLANEOUS) IMPLANT
DRAPE LAPAROTOMY 77X122 PED (DRAPES) ×1 IMPLANT
DRAPE PERI LITHO V/GYN (MISCELLANEOUS) ×1 IMPLANT
DRAPE UNDER BUTTOCK W/FLU (DRAPES) ×1 IMPLANT
DRSG GAUZE FLUFF 36X18 (GAUZE/BANDAGES/DRESSINGS) ×1 IMPLANT
ELECT REM PT RETURN 9FT ADLT (ELECTROSURGICAL) ×1
ELECTRODE REM PT RTRN 9FT ADLT (ELECTROSURGICAL) ×1 IMPLANT
GAUZE 4X4 16PLY ~~LOC~~+RFID DBL (SPONGE) ×1 IMPLANT
GAUZE SPONGE 4X4 12PLY STRL (GAUZE/BANDAGES/DRESSINGS) ×1 IMPLANT
GLOVE BIOGEL PI IND STRL 7.0 (GLOVE) ×1 IMPLANT
GLOVE SURG SYN 6.5 ES PF (GLOVE) ×1 IMPLANT
GLOVE SURG SYN 6.5 PF PI (GLOVE) ×1 IMPLANT
GOWN STRL REUS W/ TWL LRG LVL3 (GOWN DISPOSABLE) ×2 IMPLANT
GOWN STRL REUS W/TWL LRG LVL3 (GOWN DISPOSABLE) ×2
KIT TURNOVER CYSTO (KITS) ×1 IMPLANT
LABEL OR SOLS (LABEL) ×1 IMPLANT
MANIFOLD NEPTUNE II (INSTRUMENTS) ×1 IMPLANT
NDL HYPO 22X1.5 SAFETY MO (MISCELLANEOUS) ×1 IMPLANT
NDL HYPO 25X1 1.5 SAFETY (NEEDLE) ×1 IMPLANT
NDL SAFETY ECLIP 18X1.5 (MISCELLANEOUS) ×1 IMPLANT
NEEDLE HYPO 22X1.5 SAFETY MO (MISCELLANEOUS) ×1 IMPLANT
NEEDLE HYPO 25X1 1.5 SAFETY (NEEDLE) IMPLANT
NS IRRIG 500ML POUR BTL (IV SOLUTION) ×1 IMPLANT
PACK BASIN MINOR ARMC (MISCELLANEOUS) ×1 IMPLANT
PACKING GAUZE IODOFORM 1INX5YD (GAUZE/BANDAGES/DRESSINGS) ×1 IMPLANT
PAD ABD DERMACEA PRESS 5X9 (GAUZE/BANDAGES/DRESSINGS) ×2 IMPLANT
PAD PREP OB/GYN DISP 24X41 (PERSONAL CARE ITEMS) ×1 IMPLANT
SHEARS HARMONIC 9CM CVD (BLADE) IMPLANT
SOL PREP PVP 2OZ (MISCELLANEOUS) ×1
SOLUTION PREP PVP 2OZ (MISCELLANEOUS) ×1 IMPLANT
SPONGE T-LAP 18X18 ~~LOC~~+RFID (SPONGE) IMPLANT
SURGILUBE 2OZ TUBE FLIPTOP (MISCELLANEOUS) ×1 IMPLANT
SUT CHROMIC 3 0 SH 27 (SUTURE) IMPLANT
SUT ETHILON 3-0 FS-10 30 BLK (SUTURE) ×1
SUT VIC AB 3-0 SH 27 (SUTURE)
SUT VIC AB 3-0 SH 27X BRD (SUTURE) ×1 IMPLANT
SUTURE EHLN 3-0 FS-10 30 BLK (SUTURE) IMPLANT
SWAB CULTURE AMIES ANAERIB BLU (MISCELLANEOUS) IMPLANT
SYR 10ML LL (SYRINGE) ×2 IMPLANT
SYR 20ML LL LF (SYRINGE) ×1 IMPLANT
SYR BULB IRRIG 60ML STRL (SYRINGE) ×1 IMPLANT
TOWEL OR 17X26 4PK STRL BLUE (TOWEL DISPOSABLE) ×1 IMPLANT
TRAP FLUID SMOKE EVACUATOR (MISCELLANEOUS) ×1 IMPLANT
WATER STERILE IRR 500ML POUR (IV SOLUTION) ×1 IMPLANT

## 2022-08-27 NOTE — Consult Note (Signed)
Subjective:   CC: perianal abscess  HPI:  Theresa Carpenter is a 42 y.o. female who was consulted by Sherrie Mustache for evaluation of above.  First noted 1 week ago.  Symptoms include: Pain is sharp, worsening.  Exacerbated by touch.  Alleviated by nothing.  Associated with increasing lump     Past Medical History:  has a past medical history of Cyclic vomiting syndrome, Family history of adverse reaction to anesthesia, Hypertension, and Tachycardia.  Past Surgical History:  has a past surgical history that includes Cholecystectomy; Hemorrhoid surgery (N/A, 01/27/2017); and Rectal exam under anesthesia (N/A, 01/27/2017).  Family History: family history is not on file.  Social History:  reports that she has been smoking cigarettes. She has a 3.00 pack-year smoking history. She has never used smokeless tobacco. She reports current drug use. Frequency: 2.00 times per week. Drug: Marijuana. She reports that she does not drink alcohol.  Current Medications:  Prior to Admission medications   Medication Sig Start Date End Date Taking? Authorizing Provider  hydrOXYzine (ATARAX/VISTARIL) 25 MG tablet Take 25 mg by mouth 3 (three) times daily.    [provider]  metoprolol succinate (TOPROL-XL) 50 MG 24 hr tablet Take 50 mg by mouth daily.  09/15/16   [provider]  naproxen sodium (ALEVE) 220 MG tablet Take 220 mg by mouth.    [provider]  ondansetron (ZOFRAN ODT) 4 MG disintegrating tablet Take 1 tablet (4 mg total) by mouth every 8 (eight) hours as needed for nausea or vomiting. 02/11/18   Jene Every, MD  promethazine (PHENERGAN) 25 MG tablet Take 25 mg by mouth every 6 (six) hours as needed for nausea. 07/30/16   [provider]  rizatriptan (MAXALT) 5 MG tablet Take 5 mg by mouth as needed (vomiting syndrome). May repeat in 2 hours if needed    [provider]  topiramate (TOPAMAX) 25 MG tablet Take 25 mg by mouth 2 (two) times daily. 09/14/16   [provider]    Allergies:  No Known Allergies  ROS:  General: Denies weight loss, weight gain, fatigue, fevers, chills, and night sweats. Eyes: Denies blurry vision, double vision, eye pain, itchy eyes, and tearing. Ears: Denies hearing loss, earache, and ringing in ears. Nose: Denies sinus pain, congestion, infections, runny nose, and nosebleeds. Mouth/throat: Denies hoarseness, sore throat, bleeding gums, and difficulty swallowing. Heart: Denies chest pain, palpitations, racing heart, irregular heartbeat, leg pain or swelling, and decreased activity tolerance. Respiratory: Denies breathing difficulty, shortness of breath, wheezing, cough, and sputum. GI: Denies change in appetite, heartburn, nausea, vomiting, constipation, diarrhea, and blood in stool. GU: Denies difficulty urinating, pain with urinating, urgency, frequency, blood in urine. Musculoskeletal: Denies joint stiffness, pain, swelling, muscle weakness. Skin: Denies rash, itching, mass, tumors, sores, and boils Neurologic: Denies headache, fainting, dizziness, seizures, numbness, and tingling. Psychiatric: Denies depression, anxiety, difficulty sleeping, and memory loss. Endocrine: Denies heat or cold intolerance, and increased thirst or urination. Blood/lymph: Denies easy bruising, easy bruising, and swollen glands     Objective:     BP (!) 160/127 (BP Location: Left Arm)   Pulse (!) 125   Temp 98.1 F (36.7 C) (Oral)   Resp 20   Ht 5\' 3"  (1.6 m)   Wt 97.5 kg   SpO2 100%   BMI 38.09 kg/m   Constitutional :  alert, cooperative, appears stated age, and no distress  Lymphatics/Throat:  no asymmetry, masses, or scars  Respiratory:  clear to auscultation bilaterally  Cardiovascular:  regular rate and rhythm  Gastrointestinal: soft, non-tender; bowel sounds normal; no masses,  no organomegaly.  Musculoskeletal: Steady gait and movement  Skin: Cool and moist  Psychiatric: Normal affect, non-agitated, not  confused  Rectal: Chaperone present for exam.  Right gluteus at the anal verge at the posterior aspect, there is a nodular like area with a chronic opening like slit at the lateral aspect.  No obvious drainage but tender to palpation in the area.    LABS:     Latest Ref Rng & Units 08/27/2022   11:18 AM 01/21/2020    8:18 AM 02/12/2018    4:50 AM  CMP  Glucose 70 - 99 mg/dL 95  161  90   BUN 6 - 20 mg/dL 14  9  10    Creatinine 0.44 - 1.00 mg/dL 0.96  0.45  4.09   Sodium 135 - 145 mmol/L 138  140  139   Potassium 3.5 - 5.1 mmol/L 3.9  3.6  3.4   Chloride 98 - 111 mmol/L 114  112  110   CO2 22 - 32 mmol/L 15  18  20    Calcium 8.9 - 10.3 mg/dL 8.8  9.3  9.0   Total Protein 6.5 - 8.1 g/dL 7.2  7.5    Total Bilirubin 0.3 - 1.2 mg/dL 0.7  0.6    Alkaline Phos 38 - 126 U/L 86  82    AST 15 - 41 U/L 20  19    ALT 0 - 44 U/L 28  12        Latest Ref Rng & Units 08/27/2022   11:18 AM 01/21/2020    8:18 AM 02/12/2018    4:50 AM  CBC  WBC 4.0 - 10.5 K/uL 14.5  10.5  17.5   Hemoglobin 12.0 - 15.0 g/dL 81.1  91.4  78.2   Hematocrit 36.0 - 46.0 % 43.5  44.2  41.4   Platelets 150 - 400 K/uL 343  313  289     RADS: CLINICAL DATA:  Patient reports rectal abscess, noted several days ago   EXAM: CT ABDOMEN AND PELVIS WITH CONTRAST   TECHNIQUE: Multidetector CT imaging of the abdomen and pelvis was performed using the standard protocol following bolus administration of intravenous contrast.   RADIATION DOSE REDUCTION: This exam was performed according to the departmental dose-optimization program which includes automated exposure control, adjustment of the mA and/or kV according to patient size and/or use of iterative reconstruction technique.   CONTRAST:  OMNIPAQUE IOHEXOL 300 MG/ML  SOLN   COMPARISON:  CT abdomen and pelvis with contrast August 03, 2012   FINDINGS: Lower chest: Minimal bibasilar atelectasis. Normal heart size and no pericardial effusion.   Hepatobiliary: No  focal liver abnormality is seen. Status post cholecystectomy. No biliary dilatation.   Pancreas: Unremarkable. No pancreatic ductal dilatation or surrounding inflammatory changes.   Spleen: Normal in size without focal abnormality.   Adrenals/Urinary Tract: There is a 17 mm left adrenal nodule with a benign coarse calcification (series 3, image 26), unchanged since at least September 02, 2012, consistent with benign etiology. The right adrenal gland is within normal limits. Symmetric enhancement of bilateral kidneys and no hydronephrosis. The urinary bladder is within normal limits.   Stomach/Bowel: Stomach is within normal limits. Appendix appears normal. No evidence of bowel wall thickening, distention, or inflammatory changes.There is a 3.6 x 1.8 x 2.4 cm fluid collection in the right perianal soft tissues (series 3, image 94, series 6, image  86). There is significant surrounding fat stranding. There is no evidence of fistulization or intraperitoneal extension.   Vascular/Lymphatic: No significant vascular findings are present. No enlarged abdominal or pelvic lymph nodes.   Reproductive: Uterus and bilateral adnexa are unremarkable.   Other: Small fat containing umbilical hernia. No abdominopelvic ascites.   Musculoskeletal: No acute or significant osseous findings.   IMPRESSION: Right 3.6 cm perianal abscess without evidence of intraperitoneal extension or fistulization.   Electronically Signed: By: Jacob Moores M.D. On: 08/27/2022 15:16 ADDENDUM REPORT: 08/27/2022 15:22   ADDENDUM: Addendum to state that the 3.6 cm fluid collection is located within the right intergluteal soft tissues, most likely representing an inflamed/infected pilonidal cyst. No evidence of intraperitoneal extension or fistulization.     Electronically Signed   By: Jacob Moores M.D.   On: 08/27/2022 15:22    Assessment:      Perianal abscess, possible pilonidal disease per CT  report, but clinical exam is somewhat confounding in appearance.  Recommended exam under anesthesia for further evaluation for other pathology.  If a fluid collection in these is present we will proceed with an I&D,   Plan:     1. Alternatives include continued observation.  Benefits include possible symptom relief, pathologic evaluation,  Discussed the risk of surgery including recurrence, chronic pain, post-op infxn, poor cosmesis, poor/delayed wound healing, and possible re-operation to address said risks. The risks of general anesthetic, if used, includes MI, CVA, sudden death or even reaction to anesthetic medications also discussed.  Typical post-op recovery time of 3-5 days with possible activity restrictions were also discussed.  The patient verbalized understanding and all questions were answered to the patient's satisfaction.  To OR, for exam under anesthesia possible I&D of perianal abscess.  labs/images/medications/previous chart entries reviewed personally and relevant changes/updates noted above.

## 2022-08-27 NOTE — ED Triage Notes (Signed)
Pt states coming in for a rectal abscess that she noticed several days ago.   Pt states her normal heart rate is elevated into the 100s

## 2022-08-27 NOTE — ED Notes (Signed)
ED Provider at bedside. 

## 2022-08-27 NOTE — Assessment & Plan Note (Signed)
History of cyclic vomiting syndrome; patient denies any recent nausea or vomiting.  - Zofran as needed

## 2022-08-27 NOTE — H&P (Signed)
History and Physical    Patient: Theresa Carpenter ZOX:[id number] DOB: 01/04/1981 DOA: 08/27/2022 DOS: the patient was seen and examined on 08/27/2022 PCP: [facility]  Patient coming from: Home  Chief Complaint:  Chief Complaint  Patient presents with   Abscess   HPI: Theresa Carpenter is a 42 y.o. female with medical history significant of hypertension, chronic sinus tachycardia, cyclic vomiting syndrome, migraine, who presents to the ED due to suspected abscess.  Theresa Carpenter states that approximately 1 week ago, she felt a pain near her rectal region and she had some purulent drainage.  Then approximately 2 days later, the drainage stopped but the pain became severe and she noticed a whitehead developing.  Due to the pain significantly worsening, she came to the ED today.  She denies any fevers, chills, shortness of breath, chest pain, nausea, vomiting, diarrhea.  She denies any prior history of similar events.  ED Course: On arrival to the ED, patient was hypertensive at 186/133 with heart rate of 140.  She was saturating at 98% on room air.  She was afebrile at 98.6.Initial workup demonstrated WBC of 14.5, chloride of 114, bicarb 15, BUN 14, creatinine 0.61 with GFR above 60.  CT of the abdomen was obtained that demonstrated a right 3.6 cm perianal abscess without evidence of intraperitoneal extension that most likely represented an inflamed or infected pilonidal cyst.  General surgery was consulted with plans to go to the OR today.  [facility] contacted for admission.  Review of Systems: As mentioned in the history of present illness. All other systems reviewed and are negative.  Past Medical History:  Diagnosis Date   Cyclic vomiting syndrome    Family history of adverse reaction to anesthesia    BROTHER-N/V   Hypertension    Tachycardia    Past Surgical History:  Procedure Laterality Date   CHOLECYSTECTOMY     HEMORRHOID SURGERY N/A 01/27/2017   Procedure: HEMORRHOIDECTOMY;   Surgeon: Carolan Shiver, MD;  Location: [facility] ORS;  Service: General;  Laterality: N/A;   RECTAL EXAM UNDER ANESTHESIA N/A 01/27/2017   Procedure: RECTAL EXAM UNDER ANESTHESIA;  Surgeon: Carolan Shiver, MD;  Location: [facility] ORS;  Service: General;  Laterality: N/A;   Social History:  reports that she has been smoking cigarettes. She has a 3.00 pack-year smoking history. She has never used smokeless tobacco. She reports current drug use. Frequency: 2.00 times per week. Drug: Marijuana. She reports that she does not drink alcohol.  No Known Allergies  History reviewed. No pertinent family history.  Prior to Admission medications   Medication Sig Start Date End Date Taking? Authorizing Provider  hydrOXYzine (ATARAX/VISTARIL) 25 MG tablet Take 25 mg by mouth 3 (three) times daily.    [provider]  metoprolol succinate (TOPROL-XL) 50 MG 24 hr tablet Take 50 mg by mouth daily.  09/15/16   [provider]  naproxen sodium (ALEVE) 220 MG tablet Take 220 mg by mouth.    [provider]  ondansetron (ZOFRAN ODT) 4 MG disintegrating tablet Take 1 tablet (4 mg total) by mouth every 8 (eight) hours as needed for nausea or vomiting. 02/11/18   Jene Every, MD  promethazine (PHENERGAN) 25 MG tablet Take 25 mg by mouth every 6 (six) hours as needed for nausea. 07/30/16   [provider]  rizatriptan (MAXALT) 5 MG tablet Take 5 mg by mouth as needed (vomiting syndrome). May repeat in 2 hours if needed    [provider]  topiramate (  TOPAMAX) 25 MG tablet Take 25 mg by mouth 2 (two) times daily. 09/14/16   [provider]    Physical Exam: Vitals:   08/27/22 1116 08/27/22 1349 08/27/22 1554 08/27/22 1714  BP: (!) 186/133 (!) 178/110 (!) 160/127 (!) 152/107  Pulse: (!) 140 (!) 118 (!) 125 (!) 116  Resp: 18 18 20 18   Temp: 98.6 F (37 C)  98.1 F (36.7 C) 98.1 F (36.7 C)  TempSrc:   Oral Oral  SpO2: 98% 98% 100% 100%  Weight:       Height:       Physical Exam Vitals and nursing note reviewed.  Constitutional:      General: She is not in acute distress.    Appearance: She is obese.  HENT:     Head: Normocephalic and atraumatic.     Mouth/Throat:     Mouth: Mucous membranes are moist.     Pharynx: Oropharynx is clear.  Cardiovascular:     Rate and Rhythm: Regular rhythm. Tachycardia present.     Heart sounds: No murmur heard. Pulmonary:     Effort: Pulmonary effort is normal. No respiratory distress.     Breath sounds: Normal breath sounds.  Genitourinary:    Comments: External rectal examination demonstrated an area of induration on the right side of the rectum with a large central opening.  No purulent drainage noted.  Tender to palpation. Skin:    General: Skin is warm and dry.  Neurological:     General: No focal deficit present.     Mental Status: She is alert and oriented to person, place, and time.  Psychiatric:        Mood and Affect: Mood normal.        Behavior: Behavior normal.    Data Reviewed: CBC with WBC 14.5, hemoglobin 14.1, platelets of 343 CMP with sodium of 138, potassium 3.8, bicarb 15, chloride 114, glucose 95, BUN 14, creatinine 0.61, AST 20, ALT 28 and GFR above 60  EKG personally reviewed.  Sinus rhythm with rate of 131.  No ST or T wave changes concerning for acute ischemia.  CT ABDOMEN PELVIS W CONTRAST  Addendum Date: 08/27/2022   ADDENDUM REPORT: 08/27/2022 15:22 ADDENDUM: Addendum to state that the 3.6 cm fluid collection is located within the right intergluteal soft tissues, most likely representing an inflamed/infected pilonidal cyst. No evidence of intraperitoneal extension or fistulization. Electronically Signed   By: Jacob Moores M.D.   On: 08/27/2022 15:22   Result Date: 08/27/2022 CLINICAL DATA:  Patient reports rectal abscess, noted several days ago EXAM: CT ABDOMEN AND PELVIS WITH CONTRAST TECHNIQUE: Multidetector CT imaging of the abdomen and pelvis was performed  using the standard protocol following bolus administration of intravenous contrast. RADIATION DOSE REDUCTION: This exam was performed according to the departmental dose-optimization program which includes automated exposure control, adjustment of the mA and/or kV according to patient size and/or use of iterative reconstruction technique. CONTRAST:  OMNIPAQUE IOHEXOL 300 MG/ML  SOLN COMPARISON:  CT abdomen and pelvis with contrast August 03, 2012 FINDINGS: Lower chest: Minimal bibasilar atelectasis. Normal heart size and no pericardial effusion. Hepatobiliary: No focal liver abnormality is seen. Status post cholecystectomy. No biliary dilatation. Pancreas: Unremarkable. No pancreatic ductal dilatation or surrounding inflammatory changes. Spleen: Normal in size without focal abnormality. Adrenals/Urinary Tract: There is a 17 mm left adrenal nodule with a benign coarse calcification (series 3, image 26), unchanged since at least September 02, 2012, consistent with benign etiology. The right  adrenal gland is within normal limits. Symmetric enhancement of bilateral kidneys and no hydronephrosis. The urinary bladder is within normal limits. Stomach/Bowel: Stomach is within normal limits. Appendix appears normal. No evidence of bowel wall thickening, distention, or inflammatory changes.There is a 3.6 x 1.8 x 2.4 cm fluid collection in the right perianal soft tissues (series 3, image 94, series 6, image 86). There is significant surrounding fat stranding. There is no evidence of fistulization or intraperitoneal extension. Vascular/Lymphatic: No significant vascular findings are present. No enlarged abdominal or pelvic lymph nodes. Reproductive: Uterus and bilateral adnexa are unremarkable. Other: Small fat containing umbilical hernia. No abdominopelvic ascites. Musculoskeletal: No acute or significant osseous findings. IMPRESSION: Right 3.6 cm perianal abscess without evidence of intraperitoneal extension or fistulization.  Electronically Signed: By: Jacob Moores M.D. On: 08/27/2022 15:16    There are no new results to review at this time.  Assessment and Plan:  * Perirectal abscess Patient is presenting with 1 week of perirectal pain with evidence of abscess seen on imaging.  Plan is to go to the OR today for further inspection.  - General surgery consulted; appreciate their recommendations - N.p.o. - Plan for the OR today - Discussed with Dr. Tonna Boehringer he will obtain intraoperative cultures and pathology - Continue empiric coverage with Zosyn per pharmacy dosing  Essential hypertension Patient has a history of uncontrolled hypertension for which she is on metoprolol only.  I suspect she will need additional antihypertensives, but could defer to PCP  -Continue home metoprolol  Sinus tachycardia Patient has a history of inappropriate sinus tachycardia for which she is on metoprolol.  At most recent PCP visit, heart rate was 155.  Improved at this time.  - Telemetry monitoring given risk for sepsis - Continue home metoprolol  Metabolic acidosis History of Metabolic acidosis with current bicarb of 15.  Previously 14 in March 2024.  Of note, also hyperchloremia raises the question of renal tubular acidosis.  - Recommend outpatient referral to nephrology for further evaluation  Intractable vomiting with nausea History of cyclic vomiting syndrome; patient denies any recent nausea or vomiting.  - Zofran as needed  Advance Care Planning:   Code Status: Full Code   Consults: General surgery  Family Communication: Patient's mother updated at bedside  Severity of Illness: The appropriate patient status for this patient is INPATIENT. Inpatient status is judged to be reasonable and necessary in order to provide the required intensity of service to ensure the patient's safety. The patient's presenting symptoms, physical exam findings, and initial radiographic and laboratory data in the context of their  chronic comorbidities is felt to place them at high risk for further clinical deterioration. Furthermore, it is not anticipated that the patient will be medically stable for discharge from the hospital within 2 midnights of admission.   * I certify that at the point of admission it is my clinical judgment that the patient will require inpatient hospital care spanning beyond 2 midnights from the point of admission due to high intensity of service, high risk for further deterioration and high frequency of surveillance required.*  Author: Verdene Lennert, MD 08/27/2022 6:12 PM  For on call review [email or web address].

## 2022-08-27 NOTE — ED Notes (Signed)
Hospitalist in room speaking with patient. 

## 2022-08-27 NOTE — Assessment & Plan Note (Signed)
Patient is presenting with 1 week of perirectal pain with evidence of abscess seen on imaging.  Plan is to go to the OR today for further inspection.  - General surgery consulted; appreciate their recommendations - N.p.o. - Plan for the OR today - Discussed with Dr. Tonna Boehringer he will obtain intraoperative cultures and pathology - Continue empiric coverage with Zosyn per pharmacy dosing

## 2022-08-27 NOTE — Anesthesia Postprocedure Evaluation (Signed)
Anesthesia Post Note  Patient: Theresa Carpenter  Procedure(s) Performed: EXAM UNDER ANESTHESIA (Rectum) IRRIGATION AND DEBRIDEMENT PERIRECTAL ABSCESS (Rectum)  Patient location during evaluation: PACU Anesthesia Type: General Level of consciousness: awake and alert Pain management: pain level controlled Vital Signs Assessment: post-procedure vital signs reviewed and stable Respiratory status: spontaneous breathing, nonlabored ventilation and respiratory function stable Cardiovascular status: blood pressure returned to baseline and stable Postop Assessment: no apparent nausea or vomiting Anesthetic complications: no   No notable events documented.   Last Vitals:  Vitals:   08/27/22 2059 08/27/22 2206  BP: (!) 141/91 (!) 137/97  Pulse: (!) 107 (!) 102  Resp: 20 20  Temp: 36.7 C 36.7 C  SpO2: 96% 98%    Last Pain:  Vitals:   08/27/22 2238  TempSrc:   PainSc: 3                  Foye Deer

## 2022-08-27 NOTE — Transfer of Care (Signed)
Immediate Anesthesia Transfer of Care Note  Patient: Theresa Carpenter  Procedure(s) Performed: EXAM UNDER ANESTHESIA (Rectum) IRRIGATION AND DEBRIDEMENT PERIRECTAL ABSCESS (Rectum)  Patient Location: PACU  Anesthesia Type:General  Level of Consciousness: drowsy and patient cooperative  Airway & Oxygen Therapy: Patient Spontanous Breathing and Patient connected to face mask oxygen  Post-op Assessment: Report given to RN and Post -op Vital signs reviewed and stable  Post vital signs: Reviewed and stable  Last Vitals:  Vitals Value Taken Time  BP 136/69 08/27/22 1920  Temp 36.4 C 08/27/22 1920  Pulse 143 08/27/22 1926  Resp 15 08/27/22 1926  SpO2 99 % 08/27/22 1926  Vitals shown include unvalidated device data.  Last Pain:  Vitals:   08/27/22 1715  TempSrc:   PainSc: 8          Complications: No notable events documented.

## 2022-08-27 NOTE — Consult Note (Signed)
Pharmacy Antibiotic Note  Theresa Carpenter is a 42 y.o. female admitted on 08/27/2022 with 1 week of perirectal pain with evidence of abscess.  Pharmacy has been consulted for Zosyn dosing for  perianal abscess plus fistula. S/p I&D 08/27/22  Plan: Zosyn 3.375g IV q8h (4 hour infusion).  Height: 5\' 3"  (160 cm) Weight: 97.5 kg (215 lb) IBW/kg (Calculated) : 52.4  Temp (24hrs), Avg:98.3 F (36.8 C), Min:98.1 F (36.7 C), Max:98.6 F (37 C)  Recent Labs  Lab 08/27/22 1118  WBC 14.5*  CREATININE 0.61    Estimated Creatinine Clearance: 103.9 mL/min (by C-G formula based on SCr of 0.61 mg/dL).    No Known Allergies  Antimicrobials this admission: 7/4 Zosyn >>    Dose adjustments this admission:   Microbiology results: 7/4: Wound Deep surgical cx:  pending   Thank you for allowing pharmacy to be a part of this patient's care.  Sharen Hones, PharmD, BCPS Clinical Pharmacist   08/27/2022 6:44 PM

## 2022-08-27 NOTE — ED Notes (Signed)
300 mL of initial IVF NaCl flowed into patient. IVF bag burped and line re-primed and then pressure bag placed to facilitate faster flow. Pt assisted with positioning in bed for comfort, with 2 blankets placed under L arm to promote comfort at IV site. Pt reports pain has improved slightly, but would like an additional dose of pain medication if possible. Pt is alert and oriented and breathing unlabored speaking  in full sentences. Pt bed is low and locked with side rails raised x1 and call bell in reach. Pt mother at bedside.

## 2022-08-27 NOTE — ED Provider Notes (Signed)
[facility] Provider Note    Event Date/Time   First MD Initiated Contact with Patient 08/27/22 1209     (approximate)   History   Abscess   HPI  Theresa Carpenter is a 42 y.o. female with history of hypertension, tachycardia presents emergency department complaining of rectal pain.  Patient states she had hemorrhoid surgery in 2019.  Since then has not had any problems.  Noticed about a week ago that she had a abscess near the rectum and it did drain for about 3 to 4 days.  Then states that the pain came back and the area was much larger.  Denies fever or chills.  No chest pain shortness of breath.  States she is just very uncomfortable      Physical Exam   Triage Vital Signs: ED Triage Vitals  Enc Vitals Group     BP 08/27/22 1116 (!) 186/133     Pulse Rate 08/27/22 1116 (!) 140     Resp 08/27/22 1116 18     Temp 08/27/22 1116 98.6 F (37 C)     Temp src --      SpO2 08/27/22 1116 98 %     Weight 08/27/22 1113 215 lb (97.5 kg)     Height 08/27/22 1113 5\' 3"  (1.6 m)     Head Circumference --      Peak Flow --      Pain Score 08/27/22 1112 10     Pain Loc --      Pain Edu? --      Excl. in GC? --     Most recent vital signs: Vitals:   08/27/22 1116 08/27/22 1349  BP: (!) 186/133 (!) 178/110  Pulse: (!) 140 (!) 118  Resp: 18 18  Temp: 98.6 F (37 C)   SpO2: 98% 98%     General: Awake, no distress.  Patient is tearful CV:  Good peripheral perfusion. regular rate and  rhythm Resp:  Normal effort.  Abd:  No distention.  Nontender Other:  On examination of the abscess near the rectum there is a large hole noted above the rectum with a very ulcerated tender area at the rim, concerning for possible fistula   ED Results / Procedures / Treatments   Labs (all labs ordered are listed, but only abnormal results are displayed) Labs Reviewed  CBC WITH DIFFERENTIAL/PLATELET - Abnormal; Notable for the following components:      Result Value    WBC 14.5 (*)    Neutro Abs 11.1 (*)    All other components within normal limits  COMPREHENSIVE METABOLIC PANEL - Abnormal; Notable for the following components:   Chloride 114 (*)    CO2 15 (*)    Calcium 8.8 (*)    All other components within normal limits     EKG     RADIOLOGY CT abdomen pelvis IV contrast    PROCEDURES:   Procedures   MEDICATIONS ORDERED IN ED: Medications  morphine (PF) 4 MG/ML injection 4 mg (4 mg Intravenous Given 08/27/22 1320)  ondansetron (ZOFRAN) injection 4 mg (4 mg Intravenous Given 08/27/22 1319)  iohexol (OMNIPAQUE) 300 MG/ML solution 100 mL (100 mLs Intravenous Contrast Given 08/27/22 1342)  sodium chloride 0.9 % bolus 1,000 mL (1,000 mLs Intravenous New Bag/Given 08/27/22 1441)     IMPRESSION / MDM / ASSESSMENT AND PLAN / ED COURSE  I reviewed the triage vital signs and the nursing notes.  Differential diagnosis includes, but is not limited to, rectal abscess, cellulitis, thrombosed hemorrhoid  Patient's presentation is most consistent with acute illness / injury with system symptoms.   Patient's WBC is elevated at 14.5 which is indicating infection  Physical exam is concerning for fistula so we will order a CT abdomen pelvis with IV contrast.  See photo in media  Patient was given morphine 4 mg IV and Zofran 4 mg IV and 1 L normal saline IV  Clinical Course as of 08/27/22 1542  Thu Aug 27, 2022  1526 CT ABDOMEN PELVIS W CONTRAST [SF]    Clinical Course User Index [SF] Faythe Ghee, PA-C   CT abdomen pelvis IV contrast independently reviewed interpreted by me as being positive for abscess.  Consult to surgery, Dr. Tonna Boehringer is coming to see the patient.  Explained to him I think there is a fistula noted.  Patient is aware that she is waiting to see the surgeon.  Care is being transferred to Alvy Beal, PA-C  FINAL CLINICAL IMPRESSION(S) / ED DIAGNOSES   Final diagnoses:  Abscess     Rx / DC  Orders   ED Discharge Orders     None        Note:  This document was prepared using Dragon voice recognition software and may include unintentional dictation errors.    Faythe Ghee, PA-C 08/27/22 1542    Willy Eddy, MD 08/27/22 [phone]

## 2022-08-27 NOTE — Op Note (Addendum)
Preoperative diagnosis:perianal abscess Postoperative diagnosis: perianal abscess plus fistula  Procedure: Exam under anesthesia, incision and drainage of perirectal abscess  Anesthesia: GETA  Surgeon: Tonna Boehringer  Wound Classification: Clean Contaminated  Specimen: None  Complications: None  EBL: 3 mL  Indications:  Patient is a 42 y.o. female with clinical exam concerning for an anal fistula.  Here today for formal exam under anesthesia.  See H&P for further details.  Description of procedure: The patient was taken to the operating room and placed in high lithotomy position.  GETA was induced without any difficulty. A time-out was completed verifying correct patient, procedure, site, positioning, and implant(s) and/or special equipment prior to beginning this procedure.  Digital rectal exam noted obvious indurated tissue and palpable fistulous tracts within the rectum, 1cm from anal verge, right posterior aspect.  He did have some grade 1 internal hemorrhoids.  Visualization within the lumen shortly afterwards confirmed the above findings.  Copious amount of pus drained with pressure from area.  Cultured and small tissue sample taken from outside opening, roughly 1.5cm from anal verge. No sign of pilonidal disease.  Tract and abscess cavity then extensively irrigated, packed with surgicell after infusion of local.  Penrose placed through tract and secured in loop with 3-0 nylon. After confirming hemostasis, wound was covered with fluffs, secured with mesh undergarments.  The patient tolerated the procedure well and  taken to the postoperative care unit in stable condition.  Sponge and instrument count was correct at the end of the procedure.

## 2022-08-27 NOTE — Assessment & Plan Note (Signed)
History of Metabolic acidosis with current bicarb of 15.  Previously 14 in March 2024.  Of note, also hyperchloremia raises the question of renal tubular acidosis.  - Recommend outpatient referral to nephrology for further evaluation

## 2022-08-27 NOTE — ED Provider Notes (Signed)
----------------------------------------- 4:10 PM on 08/27/2022 -----------------------------------------  Blood pressure (!) 160/127, pulse (!) 125, temperature 98.1 F (36.7 C), temperature source Oral, resp. rate 20, height 5\' 3"  (1.6 m), weight 97.5 kg, SpO2 100 %.  Assuming care from S. Fisher, PA-C.  In short, Theresa Carpenter is a 42 y.o. female with a chief complaint of Abscess Patient presented to the emergency department with complaint of rectal pain for the past several days.  She reports that she did notice some drainage that persisted for 3 to 4 days, and then the area became larger and more uncomfortable.  She has not had any fevers.  Refer to the original H&P for additional details.  The current plan of care is to await general surgery consult to determine final disposition.    ____________________________________________    ED Results / Procedures / Treatments   Labs (all labs ordered are listed, but only abnormal results are displayed) Labs Reviewed  CBC WITH DIFFERENTIAL/PLATELET - Abnormal; Notable for the following components:      Result Value   WBC 14.5 (*)    Neutro Abs 11.1 (*)    All other components within normal limits  COMPREHENSIVE METABOLIC PANEL - Abnormal; Notable for the following components:   Chloride 114 (*)    CO2 15 (*)    Calcium 8.8 (*)    All other components within normal limits  HIV ANTIBODY (ROUTINE TESTING W REFLEX)     EKG     RADIOLOGY  I personally viewed and evaluated these images as part of my medical decision making, as well as reviewing the written report by the radiologist.  ED Provider Interpretation: 3.5cm fluid collection suspicious for abscess  CT ABDOMEN PELVIS W CONTRAST  Addendum Date: 08/27/2022   ADDENDUM REPORT: 08/27/2022 15:22 ADDENDUM: Addendum to state that the 3.6 cm fluid collection is located within the right intergluteal soft tissues, most likely representing an inflamed/infected pilonidal cyst. No  evidence of intraperitoneal extension or fistulization. Electronically Signed   By: Jacob Moores M.D.   On: 08/27/2022 15:22   Result Date: 08/27/2022 CLINICAL DATA:  Patient reports rectal abscess, noted several days ago EXAM: CT ABDOMEN AND PELVIS WITH CONTRAST TECHNIQUE: Multidetector CT imaging of the abdomen and pelvis was performed using the standard protocol following bolus administration of intravenous contrast. RADIATION DOSE REDUCTION: This exam was performed according to the departmental dose-optimization program which includes automated exposure control, adjustment of the mA and/or kV according to patient size and/or use of iterative reconstruction technique. CONTRAST:  OMNIPAQUE IOHEXOL 300 MG/ML  SOLN COMPARISON:  CT abdomen and pelvis with contrast August 03, 2012 FINDINGS: Lower chest: Minimal bibasilar atelectasis. Normal heart size and no pericardial effusion. Hepatobiliary: No focal liver abnormality is seen. Status post cholecystectomy. No biliary dilatation. Pancreas: Unremarkable. No pancreatic ductal dilatation or surrounding inflammatory changes. Spleen: Normal in size without focal abnormality. Adrenals/Urinary Tract: There is a 17 mm left adrenal nodule with a benign coarse calcification (series 3, image 26), unchanged since at least September 02, 2012, consistent with benign etiology. The right adrenal gland is within normal limits. Symmetric enhancement of bilateral kidneys and no hydronephrosis. The urinary bladder is within normal limits. Stomach/Bowel: Stomach is within normal limits. Appendix appears normal. No evidence of bowel wall thickening, distention, or inflammatory changes.There is a 3.6 x 1.8 x 2.4 cm fluid collection in the right perianal soft tissues (series 3, image 94, series 6, image 86). There is significant surrounding fat stranding. There is no evidence  of fistulization or intraperitoneal extension. Vascular/Lymphatic: No significant vascular findings are present.  No enlarged abdominal or pelvic lymph nodes. Reproductive: Uterus and bilateral adnexa are unremarkable. Other: Small fat containing umbilical hernia. No abdominopelvic ascites. Musculoskeletal: No acute or significant osseous findings. IMPRESSION: Right 3.6 cm perianal abscess without evidence of intraperitoneal extension or fistulization. Electronically Signed: By: Jacob Moores M.D. On: 08/27/2022 15:16     PROCEDURES:  Critical Care performed: No  Procedures   MEDICATIONS ORDERED IN ED: Medications  sodium chloride flush (NS) 0.9 % injection 3 mL ( Intravenous Automatically Held 09/04/22 2200)  acetaminophen (TYLENOL) tablet 650 mg ( Oral MAR Hold 08/27/22 1830)    Or  acetaminophen (TYLENOL) suppository 650 mg ( Rectal MAR Hold 08/27/22 1830)  ondansetron (ZOFRAN) tablet 4 mg ( Oral MAR Hold 08/27/22 1830)    Or  ondansetron (ZOFRAN) injection 4 mg ( Intravenous MAR Hold 08/27/22 1830)  HYDROmorphone (DILAUDID) injection 0.5-1 mg ( Intravenous MAR Hold 08/27/22 1830)  metoprolol succinate (TOPROL-XL) 24 hr tablet 50 mg ( Oral Automatically Held 09/05/22 1000)  atorvastatin (LIPITOR) tablet 20 mg ( Oral Automatically Held 09/05/22 1000)  topiramate (TOPAMAX) tablet 25 mg ( Oral Automatically Held 09/04/22 2200)  morphine (PF) 4 MG/ML injection 4 mg (4 mg Intravenous Given 08/27/22 1320)  ondansetron (ZOFRAN) injection 4 mg (4 mg Intravenous Given 08/27/22 1319)  iohexol (OMNIPAQUE) 300 MG/ML solution 100 mL (100 mLs Intravenous Contrast Given 08/27/22 1342)  sodium chloride 0.9 % bolus 1,000 mL (0 mLs Intravenous Stopped 08/27/22 1657)  morphine (PF) 4 MG/ML injection 4 mg (4 mg Intravenous Given 08/27/22 1555)  sodium chloride 0.9 % bolus 1,000 mL (1,000 mLs Intravenous New Bag/Given 08/27/22 1654)  piperacillin-tazobactam (ZOSYN) IVPB 3.375 g (0 g Intravenous Stopped 08/27/22 1706)  fentaNYL (SUBLIMAZE) injection 50 mcg (50 mcg Intravenous Given 08/27/22 1655)     IMPRESSION / MDM / ASSESSMENT AND PLAN  / ED COURSE  I reviewed the triage vital signs and the nursing notes.                              Differential diagnosis includes, but is not limited to, perianal abscess, perirectal abscess, hemorrhoid, fistula.  Patient's presentation is most consistent with acute presentation with potential threat to life or bodily function.  The patient is on the cardiac monitor to evaluate for evidence of arrhythmia and/or significant heart rate changes.  Patient presented to the emergency department afebrile, though persistently tachycardic.  Labs obtained by primary provider reveal a white blood cell count of 14.5.  Patient underwent CT abdomen and pelvis which reveals a 3.6 cm perianal abscess without evidence of intraperitoneal extension or fistulization.  Dr. Tonna Boehringer was consulted and has agreed to see the patient.  Given persistent tachycardia, patient was given another bolus of fluids, as well as Zosyn.  She does have a known history of cyclic vomiting syndrome, however she has not had any vomiting since last week.  Dr. Tonna Boehringer recommends operative management, as well as hospitalist admission for further evaluation and management.  Patient's diagnosis is consistent with anal mass versus abscess. Patient will be admitted to the hospitalist.   Clinical Course as of 08/27/22 1832  Thu Aug 27, 2022  1526 CT ABDOMEN PELVIS W CONTRAST [SF]    Clinical Course User Index [SF] Sherrie Mustache Roselyn Bering, PA-C    FINAL CLINICAL IMPRESSION(S) / ED DIAGNOSES   Final diagnoses:  Abscess  Rectal mass  Tachycardia     Rx / DC Orders   ED Discharge Orders     None        Note:  This document was prepared using Dragon voice recognition software and may include unintentional dictation errors.    Keturah Shavers 08/27/22 1832    Pilar Jarvis, MD 08/27/22 [phone]

## 2022-08-27 NOTE — Assessment & Plan Note (Signed)
Patient has a history of inappropriate sinus tachycardia for which she is on metoprolol.  At most recent PCP visit, heart rate was 155.  Improved at this time.  - Telemetry monitoring given risk for sepsis - Continue home metoprolol

## 2022-08-27 NOTE — Anesthesia Preprocedure Evaluation (Addendum)
Anesthesia Evaluation  Patient identified by MRN, date of birth, ID band Patient awake    Reviewed: Allergy & Precautions, H&P , NPO status , Patient's Chart, lab work & pertinent test results  History of Anesthesia Complications (+) PONV and history of anesthetic complications  Airway Mallampati: III  TM Distance: >3 FB Neck ROM: full    Dental no notable dental hx.    Pulmonary Current Smoker and Patient abstained from smoking.   Pulmonary exam normal        Cardiovascular Exercise Tolerance: Good hypertension, Pt. on home beta blockers and Pt. on medications Normal cardiovascular exam+ dysrhythmias (tachycardia on metoprolol. noted to be >150 bmp without metoprolol)  Rhythm:Regular Rate:Normal  Her echocardiogram showed normal left and right ventricular function with no significant valvular abnormalities. She also wore a Zio patch monitor, which showed a heart rate range from 66 to 178 with an average rate of 116. She had no arrhythmias. Her lab work also showed a nomral TSH   Neuro/Psych  Headaches  negative psych ROS   GI/Hepatic negative GI ROS,,,(+)     substance abuse  marijuana use  Endo/Other  negative endocrine ROS    Renal/GU      Musculoskeletal   Abdominal  (+) + obese  Peds  Hematology negative hematology ROS (+)   Anesthesia Other Findings rectal abscess  Hypertensive urgency with diastolics into the 130's. Pt states she is not surprised to have these elevated pressures due to pain. Chart review reveals uncontrolled hypertension. Pt took metoprolol last night. Pt denies cocaine use. BP lowered with pain medication. Pt remains tachycardic.   Cyclic vomiting Syndrome- Prior workup for organic causes of nausea including EGD, MRI brain have been negative. Pt denies GERD, nausea, bloating currently. She received zofran earlier today.  Past Medical History: No date: Cyclic vomiting syndrome No date:  Family history of adverse reaction to anesthesia     Comment:  BROTHER-N/V No date: Hypertension No date: Tachycardia  Past Surgical History: No date: CHOLECYSTECTOMY 01/27/2017: HEMORRHOID SURGERY; N/A     Comment:  Procedure: HEMORRHOIDECTOMY;  Surgeon: Carolan Shiver, MD;  Location: [facility] ORS;  Service: General;                Laterality: N/A; 01/27/2017: RECTAL EXAM UNDER ANESTHESIA; N/A     Comment:  Procedure: RECTAL EXAM UNDER ANESTHESIA;  Surgeon:               Carolan Shiver, MD;  Location: [facility] ORS;  Service:              General;  Laterality: N/A;  BMI    Body Mass Index: 38.09 kg/m      Reproductive/Obstetrics negative OB ROS                             Anesthesia Physical Anesthesia Plan  ASA: 3 and emergent  Anesthesia Plan: General ETT   Post-op Pain Management: Ofirmev IV (intra-op)* and Toradol IV (intra-op)*   Induction: Intravenous and Rapid sequence  PONV Risk Score and Plan: 3 and Ondansetron, Dexamethasone, Midazolam, Promethazine and Droperidol  Airway Management Planned: Oral ETT  Additional Equipment:   Intra-op Plan:   Post-operative Plan: Extubation in OR  Informed Consent: I have reviewed the patients History and Physical, chart, labs and discussed the procedure including the risks, benefits and alternatives for the proposed anesthesia  with the patient or authorized representative who has indicated his/her understanding and acceptance.     Dental Advisory Given  Plan Discussed with: CRNA and Surgeon  Anesthesia Plan Comments:         Anesthesia Quick Evaluation

## 2022-08-27 NOTE — Assessment & Plan Note (Signed)
Patient has a history of uncontrolled hypertension for which she is on metoprolol only.  I suspect she will need additional antihypertensives, but could defer to PCP  -Continue home metoprolol

## 2022-08-27 NOTE — Anesthesia Procedure Notes (Signed)
Procedure Name: Intubation Date/Time: 08/27/2022 6:37 PM  Performed by: Lynden Oxford, CRNAPre-anesthesia Checklist: Patient identified, Emergency Drugs available, Suction available and Patient being monitored Patient Re-evaluated:Patient Re-evaluated prior to induction Oxygen Delivery Method: Circle system utilized Preoxygenation: Pre-oxygenation with 100% oxygen Induction Type: IV induction Ventilation: Mask ventilation without difficulty Laryngoscope Size: McGraph and 3 Grade View: Grade II Tube type: Oral Tube size: 6.5 mm Number of attempts: 1 Airway Equipment and Method: Stylet and Video-laryngoscopy Placement Confirmation: ETT inserted through vocal cords under direct vision, positive ETCO2 and breath sounds checked- equal and bilateral Secured at: 21 cm Tube secured with: Tape Dental Injury: Teeth and Oropharynx as per pre-operative assessment

## 2022-08-28 ENCOUNTER — Encounter: Payer: Self-pay | Admitting: Surgery

## 2022-08-28 DIAGNOSIS — K611 Rectal abscess: Secondary | ICD-10-CM | POA: Diagnosis not present

## 2022-08-28 LAB — BLOOD GAS, VENOUS
Acid-base deficit: 5 mmol/L — ABNORMAL HIGH (ref 0.0–2.0)
Bicarbonate: 18.4 mmol/L — ABNORMAL LOW (ref 20.0–28.0)
O2 Saturation: 99.1 %
Patient temperature: 37
pCO2, Ven: 29 mmHg — ABNORMAL LOW (ref 44–60)
pH, Ven: 7.41 (ref 7.25–7.43)
pO2, Ven: 172 mmHg — ABNORMAL HIGH (ref 32–45)

## 2022-08-28 LAB — POCT PREGNANCY, URINE: Preg Test, Ur: NEGATIVE

## 2022-08-28 LAB — CBC
HCT: 41.4 % (ref 36.0–46.0)
Hemoglobin: 13.1 g/dL (ref 12.0–15.0)
MCH: 31 pg (ref 26.0–34.0)
MCHC: 31.6 g/dL (ref 30.0–36.0)
MCV: 97.9 fL (ref 80.0–100.0)
Platelets: 258 10*3/uL (ref 150–400)
RBC: 4.23 MIL/uL (ref 3.87–5.11)
RDW: 14.1 % (ref 11.5–15.5)
WBC: 15.4 10*3/uL — ABNORMAL HIGH (ref 4.0–10.5)
nRBC: 0 % (ref 0.0–0.2)

## 2022-08-28 LAB — BASIC METABOLIC PANEL
Anion gap: 12 (ref 5–15)
BUN: 7 mg/dL (ref 6–20)
CO2: 12 mmol/L — ABNORMAL LOW (ref 22–32)
Calcium: 8.4 mg/dL — ABNORMAL LOW (ref 8.9–10.3)
Chloride: 113 mmol/L — ABNORMAL HIGH (ref 98–111)
Creatinine, Ser: 0.56 mg/dL (ref 0.44–1.00)
GFR, Estimated: >60 mL/min (ref >60)
Glucose, Bld: 138 mg/dL — ABNORMAL HIGH (ref 70–99)
Potassium: 3.9 mmol/L (ref 3.5–5.1)
Sodium: 137 mmol/L (ref 135–145)

## 2022-08-28 LAB — AEROBIC/ANAEROBIC CULTURE W GRAM STAIN (SURGICAL/DEEP WOUND)

## 2022-08-28 LAB — HIV ANTIBODY (ROUTINE TESTING W REFLEX): HIV Screen 4th Generation wRfx: NONREACTIVE

## 2022-08-28 NOTE — Progress Notes (Signed)
Subjective:  CC: Theresa Carpenter is a 42 y.o. female  Hospital stay day 1, 1 Day Post-Op perianal abscess/fistula I&D  HPI: No issues overnight.  Pain improved.  ROS:  General: Denies weight loss, weight gain, fatigue, fevers, chills, and night sweats. Heart: Denies chest pain, palpitations, racing heart, irregular heartbeat, leg pain or swelling, and decreased activity tolerance. Respiratory: Denies breathing difficulty, shortness of breath, wheezing, cough, and sputum. GI: Denies change in appetite, heartburn, nausea, vomiting, constipation, diarrhea, and blood in stool. GU: Denies difficulty urinating, pain with urinating, urgency, frequency, blood in urine.   Objective:   Temp:  [97.5 F (36.4 C)-98.1 F (36.7 C)] 98 F (36.7 C) (07/05 0719) Pulse Rate:  [102-143] 109 (07/05 0719) Resp:  [15-21] 16 (07/05 0719) BP: (116-178)/(69-127) 152/101 (07/05 0719) SpO2:  [85 %-100 %] 98 % (07/05 0719)     Height: 5\' 3"  (160 cm) Weight: 97.5 kg BMI (Calculated): 38.09   Intake/Output this shift:   Intake/Output Summary (Last 24 hours) at 08/28/2022 1310 Last data filed at 08/28/2022 0432 Gross per 24 hour  Intake 1508 ml  Output --  Net 1508 ml    Constitutional :  alert, cooperative, appears stated age, and no distress  Respiratory:  clear to auscultation bilaterally  Cardiovascular:  regular rate and rhythm  Gastrointestinal: soft, non-tender; bowel sounds normal; no masses,  no organomegaly.   Skin: Cool and moist.   Psychiatric: Normal affect, non-agitated, not confused       LABS:     Latest Ref Rng & Units 08/28/2022    4:27 AM 08/27/2022   11:18 AM 01/21/2020    8:18 AM  CMP  Glucose 70 - 99 mg/dL 161  95  096   BUN 6 - 20 mg/dL 7  14  9    Creatinine 0.44 - 1.00 mg/dL 0.45  4.09  8.11   Sodium 135 - 145 mmol/L 137  138  140   Potassium 3.5 - 5.1 mmol/L 3.9  3.9  3.6   Chloride 98 - 111 mmol/L 113  114  112   CO2 22 - 32 mmol/L 12  15  18    Calcium 8.9 - 10.3 mg/dL  8.4  8.8  9.3   Total Protein 6.5 - 8.1 g/dL  7.2  7.5   Total Bilirubin 0.3 - 1.2 mg/dL  0.7  0.6   Alkaline Phos 38 - 126 U/L  86  82   AST 15 - 41 U/L  20  19   ALT 0 - 44 U/L  28  12       Latest Ref Rng & Units 08/28/2022    4:27 AM 08/27/2022   11:18 AM 01/21/2020    8:18 AM  CBC  WBC 4.0 - 10.5 K/uL 15.4  14.5  10.5   Hemoglobin 12.0 - 15.0 g/dL 91.4  78.2  95.6   Hematocrit 36.0 - 46.0 % 41.4  43.5  44.2   Platelets 150 - 400 K/uL 258  343  313     RADS: N/a Assessment:   S/p perianal fistula/abscess I&D, seton placement.  No concerns today. Recommended continuing iv abx until normal wbc. F/u cultures.  Change dressing prn.   labs/images/medications/previous chart entries reviewed personally and relevant changes/updates noted above.

## 2022-08-28 NOTE — Progress Notes (Signed)
       CROSS COVER NOTE  NAME: Theresa Carpenter MRN: [id number] DOB : 03-14-1980    Concern as stated by nurse / staff   140/110     Pertinent findings on chart review:  HPI: Theresa Carpenter is a 42 y.o. female with medical history significant of hypertension, chronic sinus tachycardia, cyclic vomiting syndrome, migraine, who presents to the ED due to suspected abscess. Underwent I *& D with gen surg    Assessment and  Interventions   Assessment: Noted by admtting MD hypertension likely poorly controlled with on metoprolol on board for management Chem panels indicative of a metabolic acidosis    Plan:  VBG  7.41,29, 172, 18.4 indicative well compensated metabolic acidosis likely from cyclic vomiting. Renal function normal. No change in plan at this time.       Donnie Mesa NP [facility] Cross Cover 7pm-7am - check amion for availability Pager [phone]

## 2022-08-28 NOTE — Discharge Instructions (Signed)
Removal, Care After This sheet gives you information about how to care for yourself after your procedure. Your health care provider may also give you more specific instructions. If you have problems or questions, contact your health care provider. What can I expect after the procedure? After the procedure, it is common to have: Soreness. Bruising. Itching. Follow these instructions at home: site care Follow instructions from your health care provider about how to take care of your site. Make sure you: Wash your hands with soap and water before and after you change your bandage (dressing). If soap and water are not available, use hand sanitizer. Leave stitches (sutures), skin glue, or adhesive strips in place. These skin closures may need to stay in place for 2 weeks or longer. If adhesive strip edges start to loosen and curl up, you may trim the loose edges. Do not remove adhesive strips completely unless your health care provider tells you to do that. If the area bleeds or bruises, apply gentle pressure for 10 minutes. OK TO SHOWER IN 24HRS  Check your site every day for signs of infection. Check for: Redness, swelling, or pain. Fluid or blood. Warmth. Pus or a bad smell.  General instructions Rest and then return to your normal activities as told by your health care provider.  tylenol and advil as needed for discomfort.  Please alternate between the two every four hours as needed for pain.    Use narcotics, if prescribed, only when tylenol and motrin is not enough to control pain.  325-650mg every 8hrs to max of 3000mg/24hrs (including the 325mg in every norco dose) for the tylenol.    Advil up to 800mg per dose every 8hrs as needed for pain.   Keep all follow-up visits as told by your health care provider. This is important. Contact a health care provider if: You have redness, swelling, or pain around your site. You have fluid or blood coming from your site. Your site feels warm to  the touch. You have pus or a bad smell coming from your site. You have a fever. Your sutures, skin glue, or adhesive strips loosen or come off sooner than expected. Get help right away if: You have bleeding that does not stop with pressure or a dressing. Summary After the procedure, it is common to have some soreness, bruising, and itching at the site. Follow instructions from your health care provider about how to take care of your site. Check your site every day for signs of infection. Contact a health care provider if you have redness, swelling, or pain around your site, or your site feels warm to the touch. Keep all follow-up visits as told by your health care provider. This is important. This information is not intended to replace advice given to you by your health care provider. Make sure you discuss any questions you have with your health care provider. Document Released: 03/08/2015 Document Revised: 08/09/2017 Document Reviewed: 08/09/2017 Elsevier Interactive Patient Education  2019 [employer]   

## 2022-08-28 NOTE — Progress Notes (Signed)
  PROGRESS NOTE    Theresa Carpenter  WUJ:[id number] DOB: [birth year] DOA: 08/27/2022 PCP: [facility]  205A/205A-AA  LOS: 1 day   Brief hospital course:   Assessment & Plan: Theresa Carpenter is a 42 y.o. female with medical history significant of hypertension, chronic sinus tachycardia, cyclic vomiting syndrome, migraine, who presents to the ED due to suspected abscess.    * Perirectal abscess S/p I/D and seton placement  Patient is presenting with 1 week of perirectal pain with evidence of abscess seen on imaging.  Plan is to go to the OR today for further inspection. --cont zosyn until WBC normalizes --change dressing PRN  Essential hypertension Patient has a history of uncontrolled hypertension for which she is on metoprolol only.  I suspect she will need additional antihypertensives, but could defer to PCP -Continue home metoprolol  Sinus tachycardia Patient has a history of inappropriate sinus tachycardia for which she is on metoprolol.  At most recent PCP visit, heart rate was 155.  Improved at this time. - Continue home metoprolol  Metabolic acidosis, chronic History of Metabolic acidosis with current bicarb of 15.  Previously 14 in March 2024.  Of note, also hyperchloremia raises the question of renal tubular acidosis. - Recommend outpatient referral to nephrology for further evaluation   DVT prophylaxis: Lovenox SQ Code Status: Full code  Family Communication:  Level of care: Telemetry Medical Dispo:   The patient is from: home Anticipated d/c is to: home Anticipated d/c date is: 1-2 days   Subjective and Interval History:  No new complaint today.     Objective: Vitals:   08/28/22 0436 08/28/22 0500 08/28/22 0719 08/28/22 1517  BP: (!) 149/115 (!) 140/110 (!) 152/101 (!) 147/101  Pulse: (!) 104  (!) 109 (!) 107  Resp: 16  16 18   Temp: 98 F (36.7 C)  98 F (36.7 C) 98 F (36.7 C)  TempSrc: Oral  Oral Oral  SpO2: 100%  98% 98%  Weight:       Height:        Intake/Output Summary (Last 24 hours) at 08/28/2022 1800 Last data filed at 08/28/2022 0432 Gross per 24 hour  Intake 408 ml  Output --  Net 408 ml   Filed Weights   08/27/22 1113  Weight: 97.5 kg    Examination:   Constitutional: NAD, AAOx3 HEENT: conjunctivae and lids normal, EOMI CV: No cyanosis.   RESP: normal respiratory effort, on RA Neuro: II - XII grossly intact.   Psych: Normal mood and affect.  Appropriate judgement and reason   Data Reviewed: I have personally reviewed labs and imaging studies  Time spent: 35 minutes  Darlin Priestly, MD [facility] If 7PM-7AM, please contact night-coverage 08/28/2022, 6:00 PM

## 2022-08-28 NOTE — Consult Note (Signed)
Pharmacy Antibiotic Note  Theresa Carpenter is a 42 y.o. female admitted on 08/27/2022 with 1 week of perirectal pain with evidence of abscess.  Pharmacy has been consulted for Zosyn dosing for  perianal abscess plus fistula. S/p I&D 08/27/22  Plan: Continue Zosyn 3.375g IV q8h (4 hour infusion).  Height: 5\' 3"  (160 cm) Weight: 97.5 kg (215 lb) IBW/kg (Calculated) : 52.4  Temp (24hrs), Avg:98 F (36.7 C), Min:97.5 F (36.4 C), Max:98.6 F (37 C)  Recent Labs  Lab 08/27/22 1118 08/28/22 0427  WBC 14.5* 15.4*  CREATININE 0.61 0.56     Estimated Creatinine Clearance: 103.9 mL/min (by C-G formula based on SCr of 0.56 mg/dL).    No Known Allergies  Antimicrobials this admission: 7/4 Zosyn >>    Dose adjustments this admission: N/A  Microbiology results: 7/4: Wound Deep surgical cx:  pending   Thank you for allowing pharmacy to be a part of this patient's care.  Barrie Folk, PharmD Clinical Pharmacist   08/28/2022 8:52 AM

## 2022-08-29 DIAGNOSIS — K611 Rectal abscess: Secondary | ICD-10-CM | POA: Diagnosis not present

## 2022-08-29 LAB — CBC
HCT: 38.1 % (ref 36.0–46.0)
Hemoglobin: 12.5 g/dL (ref 12.0–15.0)
MCH: 30.9 pg (ref 26.0–34.0)
MCHC: 32.8 g/dL (ref 30.0–36.0)
MCV: 94.1 fL (ref 80.0–100.0)
Platelets: 298 10*3/uL (ref 150–400)
RBC: 4.05 MIL/uL (ref 3.87–5.11)
RDW: 13.8 % (ref 11.5–15.5)
WBC: 14.1 10*3/uL — ABNORMAL HIGH (ref 4.0–10.5)
nRBC: 0 % (ref 0.0–0.2)

## 2022-08-29 LAB — BASIC METABOLIC PANEL
Anion gap: 8 (ref 5–15)
BUN: 8 mg/dL (ref 6–20)
CO2: 18 mmol/L — ABNORMAL LOW (ref 22–32)
Calcium: 8.5 mg/dL — ABNORMAL LOW (ref 8.9–10.3)
Chloride: 113 mmol/L — ABNORMAL HIGH (ref 98–111)
Creatinine, Ser: 0.67 mg/dL (ref 0.44–1.00)
GFR, Estimated: >60 mL/min (ref >60)
Glucose, Bld: 87 mg/dL (ref 70–99)
Potassium: 3.1 mmol/L — ABNORMAL LOW (ref 3.5–5.1)
Sodium: 139 mmol/L (ref 135–145)

## 2022-08-29 LAB — MAGNESIUM: Magnesium: 2 mg/dL (ref 1.7–2.4)

## 2022-08-29 MED ORDER — POTASSIUM CHLORIDE CRYS ER 20 MEQ PO TBCR
40.0000 meq | EXTENDED_RELEASE_TABLET | Freq: Once | ORAL | Status: AC
  Administered 2022-08-29: 40 meq via ORAL
  Filled 2022-08-29: qty 2

## 2022-08-29 MED ORDER — HYDROXYZINE HCL 25 MG PO TABS
25.0000 mg | ORAL_TABLET | Freq: Every evening | ORAL | Status: DC | PRN
  Administered 2022-08-29: 25 mg via ORAL
  Filled 2022-08-29: qty 1

## 2022-08-29 NOTE — Progress Notes (Signed)
  PROGRESS NOTE    Theresa Carpenter  NGE:[id number] DOB: Mar 15, 1980 DOA: 08/27/2022 PCP: [facility]  205A/205A-AA  LOS: 2 days   Brief hospital course:   Assessment & Plan: Theresa Carpenter is a 42 y.o. female with medical history significant of hypertension, chronic sinus tachycardia, cyclic vomiting syndrome, migraine, who presents to the ED due to suspected abscess.    * Perirectal abscess S/p I/D and seton placement  Patient is presenting with 1 week of perirectal pain with evidence of abscess seen on imaging.  Plan is to go to the OR today for further inspection. --cont zosyn until WBC normalizes --change dressing PRN  Essential hypertension Patient has a history of uncontrolled hypertension for which she is on metoprolol only.  I suspect she will need additional antihypertensives, but could defer to PCP -Continue home metoprolol  Sinus tachycardia Patient has a history of inappropriate sinus tachycardia for which she is on metoprolol.  At most recent PCP visit, heart rate was 155.  Improved at this time. - Continue home metoprolol  Metabolic acidosis, chronic History of Metabolic acidosis with current bicarb of 15.  Previously 14 in March 2024.  Of note, also hyperchloremia raises the question of renal tubular acidosis. - Recommend outpatient referral to nephrology for further evaluation   DVT prophylaxis: Lovenox SQ Code Status: Full code  Family Communication:  Level of care: Telemetry Medical Dispo:   The patient is from: home Anticipated d/c is to: home Anticipated d/c date is: 1-2 days   Subjective and Interval History:  Normal oral intake.  Some pain in her rectal area.   Objective: Vitals:   08/28/22 1947 08/29/22 0344 08/29/22 0726 08/29/22 1440  BP: (!) 135/98 (!) 157/101 (!) 150/95 (!) 158/98  Pulse: (!) 101 (!) 107 96 93  Resp:  17 18 18   Temp:  98.1 F (36.7 C) 98.1 F (36.7 C) 97.8 F (36.6 C)  TempSrc:  Oral    SpO2: 100% 100% 97%  99%  Weight:      Height:        Intake/Output Summary (Last 24 hours) at 08/29/2022 1451 Last data filed at 08/28/2022 1924 Gross per 24 hour  Intake 120 ml  Output --  Net 120 ml   Filed Weights   08/27/22 1113  Weight: 97.5 kg    Examination:   Constitutional: NAD, AAOx3 HEENT: conjunctivae and lids normal, EOMI CV: No cyanosis.   RESP: normal respiratory effort, on RA Neuro: II - XII grossly intact.   Psych: Normal mood and affect.  Appropriate judgement and reason   Data Reviewed: I have personally reviewed labs and imaging studies  Time spent: 25 minutes  Darlin Priestly, MD [facility] If 7PM-7AM, please contact night-coverage 08/29/2022, 2:51 PM

## 2022-08-29 NOTE — Progress Notes (Signed)
Patient ID: Theresa Carpenter, female   DOB: 05-31-1980, 42 y.o.   MRN: [id number]     SURGICAL PROGRESS NOTE   Hospital Day(s): 2.   Interval History: Patient seen and examined, no acute events or new complaints overnight. Patient reports still feeling very sore in the perianal area.  She endorses that the bowel movement was very painful.  Denies fever.  Vital signs in last 24 hours: [min-max] current  Temp:  [98 F (36.7 C)-98.6 F (37 C)] 98.1 F (36.7 C) (07/06 0726) Pulse Rate:  [96-107] 96 (07/06 0726) Resp:  [17-19] 18 (07/06 0726) BP: (135-157)/(95-101) 150/95 (07/06 0726) SpO2:  [97 %-100 %] 97 % (07/06 0726)     Height: 5\' 3"  (160 cm) Weight: 97.5 kg BMI (Calculated): 38.09   Physical Exam:  Constitutional: alert, cooperative and no distress  Respiratory: breathing non-labored at rest  Cardiovascular: regular rate and sinus rhythm  Gastrointestinal: soft, non-tender, and non-distended Rectal: Perianal wound with associated erythema.  Purulent drainage in the gauze.  Adequate rectal tone.  Labs:     Latest Ref Rng & Units 08/29/2022    4:19 AM 08/28/2022    4:27 AM 08/27/2022   11:18 AM  CBC  WBC 4.0 - 10.5 K/uL 14.1  15.4  14.5   Hemoglobin 12.0 - 15.0 g/dL 13.2  44.0  10.2   Hematocrit 36.0 - 46.0 % 38.1  41.4  43.5   Platelets 150 - 400 K/uL 298  258  343       Latest Ref Rng & Units 08/29/2022    4:19 AM 08/28/2022    4:27 AM 08/27/2022   11:18 AM  CMP  Glucose 70 - 99 mg/dL 87  725  95   BUN 6 - 20 mg/dL 8  7  14    Creatinine 0.44 - 1.00 mg/dL 3.66  4.40  3.47   Sodium 135 - 145 mmol/L 139  137  138   Potassium 3.5 - 5.1 mmol/L 3.1  3.9  3.9   Chloride 98 - 111 mmol/L 113  113  114   CO2 22 - 32 mmol/L 18  12  15    Calcium 8.9 - 10.3 mg/dL 8.5  8.4  8.8   Total Protein 6.5 - 8.1 g/dL   7.2   Total Bilirubin 0.3 - 1.2 mg/dL   0.7   Alkaline Phos 38 - 126 U/L   86   AST 15 - 41 U/L   20   ALT 0 - 44 U/L   28     Imaging studies: No new pertinent imaging  studies   Assessment/Plan:  42 y.o. female with perianal abscess 2 Days Post-Op s/p incision and drainage.  -No fever -White blood cell count continue elevated.  Possibly started to trend down -Continue IV antibiotic therapy until the white blood cell continue to improve.  Follow-up cultures -Continue dressing changes -Patient may shower -Continue pain management  Gae Gallop, MD

## 2022-08-30 LAB — CBC
HCT: 39.2 % (ref 36.0–46.0)
Hemoglobin: 13 g/dL (ref 12.0–15.0)
MCH: 30.5 pg (ref 26.0–34.0)
MCHC: 33.2 g/dL (ref 30.0–36.0)
MCV: 92 fL (ref 80.0–100.0)
Platelets: 334 10*3/uL (ref 150–400)
RBC: 4.26 MIL/uL (ref 3.87–5.11)
RDW: 13.6 % (ref 11.5–15.5)
WBC: 9.7 10*3/uL (ref 4.0–10.5)
nRBC: 0 % (ref 0.0–0.2)

## 2022-08-30 LAB — BASIC METABOLIC PANEL
Anion gap: 8 (ref 5–15)
BUN: 9 mg/dL (ref 6–20)
CO2: 18 mmol/L — ABNORMAL LOW (ref 22–32)
Calcium: 8.4 mg/dL — ABNORMAL LOW (ref 8.9–10.3)
Chloride: 112 mmol/L — ABNORMAL HIGH (ref 98–111)
Creatinine, Ser: 0.57 mg/dL (ref 0.44–1.00)
GFR, Estimated: >60 mL/min (ref >60)
Glucose, Bld: 92 mg/dL (ref 70–99)
Potassium: 3.4 mmol/L — ABNORMAL LOW (ref 3.5–5.1)
Sodium: 138 mmol/L (ref 135–145)

## 2022-08-30 LAB — AEROBIC/ANAEROBIC CULTURE W GRAM STAIN (SURGICAL/DEEP WOUND)

## 2022-08-30 LAB — MAGNESIUM: Magnesium: 1.8 mg/dL (ref 1.7–2.4)

## 2022-08-30 MED ORDER — HYDROCODONE-ACETAMINOPHEN 5-325 MG PO TABS: 1.0000 | ORAL_TABLET | ORAL | 0 refills | Status: AC | PRN

## 2022-08-30 MED ORDER — AMOXICILLIN-POT CLAVULANATE 875-125 MG PO TABS: 1.0000 | ORAL_TABLET | Freq: Two times a day (BID) | ORAL | 0 refills | Status: AC

## 2022-08-30 MED ORDER — POTASSIUM CHLORIDE CRYS ER 20 MEQ PO TBCR
40.0000 meq | EXTENDED_RELEASE_TABLET | Freq: Once | ORAL | Status: AC
  Administered 2022-08-30: 40 meq via ORAL
  Filled 2022-08-30: qty 2

## 2022-08-30 NOTE — Plan of Care (Signed)
Adequate for discharge. Resolving careplan.  Theresa Carpenter [name]  

## 2022-08-30 NOTE — Progress Notes (Signed)
Patient's ride arrived. Ambulatory patient walked to exit discharged in stable condition.  Theresa Carpenter

## 2022-08-30 NOTE — Discharge Summary (Signed)
  Patient ID: SURAH FABBRO MRN: [id number] DOB/AGE: [birth year] 42 y.o.  Admit date: 08/27/2022 Discharge date: 08/30/2022   Discharge Diagnoses:  Principal Problem:   Perirectal abscess Active Problems:   Intractable vomiting with nausea   Essential hypertension   Metabolic acidosis   Sinus tachycardia   Procedures: Incision and drainage of perianal abscess  Hospital Course: Patient admitted with perianal abscess.  She had incision and drainage of perianal abscess.  She has been recovering well.  The white blood cell count has decreased to normal today.  She has been responding to the drainage and antibiotic therapy.  Pain controlled.  Patient is doing great local care.  Physical Exam Vitals reviewed.  HENT:     Head: Normocephalic.  Cardiovascular:     Rate and Rhythm: Normal rate and regular rhythm.  Pulmonary:     Effort: Pulmonary effort is normal.     Breath sounds: Normal breath sounds.  Abdominal:     General: Abdomen is flat. Bowel sounds are normal.     Palpations: Abdomen is soft.  Musculoskeletal:     Cervical back: Normal range of motion.  Neurological:     Mental Status: She is alert.   Rectal: Wound with Penrose, no erythema, no purulence drainage.  Good rectal tone.   Consults: None  Disposition: Discharge disposition: 01-Home or Self Care       Discharge Instructions     Diet - low sodium heart healthy   Complete by: As directed    Increase activity slowly   Complete by: As directed       Allergies as of 08/30/2022   No Known Allergies      Medication List     TAKE these medications    amoxicillin-clavulanate 875-125 MG tablet Commonly known as: AUGMENTIN Take 1 tablet by mouth 2 (two) times daily for 7 days.   atorvastatin 20 MG tablet Commonly known as: LIPITOR Take 1 tablet by mouth daily.   HYDROcodone-acetaminophen 5-325 MG tablet Commonly known as: Norco Take 1 tablet by mouth every 4 (four) hours as needed for up to 3  days for moderate pain.   hydrOXYzine 25 MG tablet Commonly known as: ATARAX Take 25 mg by mouth 3 (three) times daily.   Maxalt 5 MG tablet Generic drug: rizatriptan Take 5 mg by mouth as needed (vomiting syndrome). May repeat in 2 hours if needed   metoprolol succinate 50 MG 24 hr tablet Commonly known as: TOPROL-XL Take 50 mg by mouth daily.   naproxen sodium 220 MG tablet Commonly known as: ALEVE Take 220 mg by mouth.   ondansetron 4 MG disintegrating tablet Commonly known as: Zofran ODT Take 1 tablet (4 mg total) by mouth every 8 (eight) hours as needed for nausea or vomiting.   promethazine 25 MG tablet Commonly known as: PHENERGAN Take 25 mg by mouth every 6 (six) hours as needed for nausea.   topiramate 25 MG tablet Commonly known as: TOPAMAX Take 25 mg by mouth 2 (two) times daily.        Follow-up Information     Sakai, Isami, DO Follow up in 1 week(s).   Specialties: General Surgery, Surgery Why: post op perianal abscess drainage Contact information: [address] [place] [address] [phone]

## 2022-08-30 NOTE — Plan of Care (Signed)

## 2022-08-30 NOTE — TOC CM/SW Note (Signed)
Transition of Care [facility]) - Inpatient Brief Assessment   Patient Details  Name: Theresa Carpenter MRN: [id number] Date of Birth: 12/26/1980  Transition of Care [facility]) CM/SW Contact:    Chapman Fitch, RN Phone Number: 08/30/2022, 9:22 AM   Clinical Narrative:    Transition of Care Asessment: Insurance and Status: Insurance coverage has been reviewed Patient has primary care physician: Yes     Prior/Current Home Services: No current home services Social Determinants of Health Reivew: SDOH reviewed no interventions necessary Readmission risk has been reviewed: Yes Transition of care needs: no transition of care needs at this time

## 2022-08-30 NOTE — Progress Notes (Signed)
IV removed. Discharged instructions reviewed. Patient is getting dressed and waiting for Dr Fran Lowes to round before calling her ride.  Cornell Barman Theresa Carpenter

## 2022-08-31 LAB — AEROBIC/ANAEROBIC CULTURE W GRAM STAIN (SURGICAL/DEEP WOUND)

## 2022-11-06 ENCOUNTER — Emergency Department: Payer: Medicaid Other

## 2022-11-06 ENCOUNTER — Emergency Department
Admission: EM | Admit: 2022-11-06 | Discharge: 2022-11-06 | Disposition: A | Discharge status: Home / Self Care | Payer: Medicaid Other | Attending: Emergency Medicine | Admitting: Emergency Medicine

## 2022-11-06 ENCOUNTER — Other Ambulatory Visit: Payer: Self-pay

## 2022-11-06 DIAGNOSIS — K6289 Other specified diseases of anus and rectum: Secondary | ICD-10-CM | POA: Diagnosis present

## 2022-11-06 DIAGNOSIS — K610 Anal abscess: Secondary | ICD-10-CM | POA: Insufficient documentation

## 2022-11-06 DIAGNOSIS — B999 Unspecified infectious disease: Secondary | ICD-10-CM

## 2022-11-06 DIAGNOSIS — I10 Essential (primary) hypertension: Secondary | ICD-10-CM | POA: Insufficient documentation

## 2022-11-06 LAB — COMPREHENSIVE METABOLIC PANEL
ALT: 33 U/L (ref 0–44)
AST: 24 U/L (ref 15–41)
Albumin: 3.6 g/dL (ref 3.5–5.0)
Alkaline Phosphatase: 63 U/L (ref 38–126)
Anion gap: 8 (ref 5–15)
BUN: 15 mg/dL (ref 6–20)
CO2: 18 mmol/L — ABNORMAL LOW (ref 22–32)
Calcium: 9 mg/dL (ref 8.9–10.3)
Chloride: 111 mmol/L (ref 98–111)
Creatinine, Ser: 0.62 mg/dL (ref 0.44–1.00)
GFR, Estimated: >60 mL/min (ref >60)
Glucose, Bld: 104 mg/dL — ABNORMAL HIGH (ref 70–99)
Potassium: 3.8 mmol/L (ref 3.5–5.1)
Sodium: 137 mmol/L (ref 135–145)
Total Bilirubin: 0.5 mg/dL (ref 0.3–1.2)
Total Protein: 6.7 g/dL (ref 6.5–8.1)

## 2022-11-06 LAB — CBC
HCT: 47.2 % — ABNORMAL HIGH (ref 36.0–46.0)
Hemoglobin: 15.4 g/dL — ABNORMAL HIGH (ref 12.0–15.0)
MCH: 29.8 pg (ref 26.0–34.0)
MCHC: 32.6 g/dL (ref 30.0–36.0)
MCV: 91.5 fL (ref 80.0–100.0)
Platelets: 268 10*3/uL (ref 150–400)
RBC: 5.16 MIL/uL — ABNORMAL HIGH (ref 3.87–5.11)
RDW: 13.6 % (ref 11.5–15.5)
WBC: 8.4 10*3/uL (ref 4.0–10.5)
nRBC: 0 % (ref 0.0–0.2)

## 2022-11-06 LAB — HCG, QUANTITATIVE, PREGNANCY: hCG, Beta Chain, Quant, S: <1 m[IU]/mL (ref <5)

## 2022-11-06 LAB — LACTIC ACID, PLASMA: Lactic Acid, Venous: 1.5 mmol/L (ref 0.5–1.9)

## 2022-11-06 MED ORDER — AMOXICILLIN-POT CLAVULANATE 875-125 MG PO TABS: 1.0000 | ORAL_TABLET | Freq: Two times a day (BID) | ORAL | 0 refills | Status: DC

## 2022-11-06 MED ORDER — IOHEXOL 300 MG/ML  SOLN
100.0000 mL | Freq: Once | INTRAMUSCULAR | Status: AC | PRN
  Administered 2022-11-06: 100 mL via INTRAVENOUS

## 2022-11-06 MED ORDER — HYDROCODONE-ACETAMINOPHEN 5-325 MG PO TABS
1.0000 | ORAL_TABLET | Freq: Once | ORAL | Status: AC
  Administered 2022-11-06: 1 via ORAL
  Filled 2022-11-06: qty 1

## 2022-11-06 MED ORDER — IOHEXOL 300 MG/ML  SOLN: 100.0000 mL | Freq: Once | INTRAMUSCULAR | Status: DC | PRN

## 2022-11-06 MED ORDER — HYDROCODONE-ACETAMINOPHEN 5-325 MG PO TABS
1.0000 | ORAL_TABLET | ORAL | 0 refills | Status: DC | PRN
Start: 2022-11-06 — End: 2023-04-08

## 2022-11-06 MED ORDER — HYDROCODONE-ACETAMINOPHEN 5-325 MG PO TABS
1.0000 | ORAL_TABLET | ORAL | 0 refills | Status: DC | PRN
Start: 2022-11-06 — End: 2022-11-06

## 2022-11-06 MED ORDER — AMOXICILLIN-POT CLAVULANATE 875-125 MG PO TABS
1.0000 | ORAL_TABLET | Freq: Once | ORAL | Status: AC
  Administered 2022-11-06: 1 via ORAL
  Filled 2022-11-06: qty 1

## 2022-11-06 NOTE — ED Provider Notes (Signed)
[facility] Provider Note    Event Date/Time   First MD Initiated Contact with Patient 11/06/22 1406     (approximate)  History   Chief Complaint: Wound Check  HPI  Theresa Carpenter is a 42 y.o. female with a past medical history of hypertension who presents to the emergency department for perirectal pain.  According to the patient she had a perianal abscess that was drained and a surgical drain was left in place.  Surgical drain was pulled several weeks ago per patient.  She states since the drain has been removed she has had increasing pain and swelling to the area.  Patient states the pain now feels like it did recently before she had the operation.  Patient denies any fever.  I reviewed the patient's discharge summary from general surgery 08/30/2022 which time the patient was discharged home after an incision and drainage of a perianal abscess with a Penrose drain in place.  Physical Exam   Triage Vital Signs: ED Triage Vitals [11/06/22 1244]  Encounter Vitals Group     BP (!) 150/113     Systolic BP Percentile      Diastolic BP Percentile      Pulse Rate (!) 114     Resp 17     Temp 98.9 F (37.2 C)     Temp Source Oral     SpO2 99 %     Weight 214 lb 15.2 oz (97.5 kg)     Height 5\' 3"  (1.6 m)     Head Circumference      Peak Flow      Pain Score 10     Pain Loc      Pain Education      Exclude from Growth Chart     Most recent vital signs: Vitals:   11/06/22 1244  BP: (!) 150/113  Pulse: (!) 114  Resp: 17  Temp: 98.9 F (37.2 C)  SpO2: 99%    General: Awake, no distress.  CV:  Good peripheral perfusion.  Regular rate and rhythm  Resp:  Normal effort.  Equal breath sounds bilaterally.  Abd:  No distention.  Soft, nontender.  No rebound or guarding. Other:  Rectal examination shows tenderness to palpation along the right side of the gluteal cleft.  There is a epithelialized tract likely from the Penrose drain that was removed several  weeks ago.  No discharge noted.  Moderately tender to this area with induration but no obvious erythema or fluctuance.   ED Results / Procedures / Treatments   RADIOLOGY  I have reviewed the CT images.  On my interpretation of the images I do not see any obvious abscess around the anus although there does appear to be some inflammation. Radiology is read the CT scan as soft tissue stranding/inflammation but no abscess.   MEDICATIONS ORDERED IN ED: Medications - No data to display   IMPRESSION / MDM / ASSESSMENT AND PLAN / ED COURSE  I reviewed the triage vital signs and the nursing notes.  Patient's presentation is most consistent with acute presentation with potential threat to life or bodily function.  Patient presents to the emergency department for increased rectal pain and swelling around the area of her perianal abscess that was I&D that with a Penrose drain in place by Dr. Tonna Boehringer on 7//24.  Patient is lab work is reassuring today with a normal CBC with a normal white blood cell count, reassuring chemistry and a normal lactic  acid.  I spoke with Dr. Maia Plan of general surgery.  He would like a CT scan with contrast performed to further evaluate which I believe is reasonable.  We will obtain CT imaging and discussed with surgery for further treatment.  CT scan shows soft tissue stranding with no abscess.  Dr. Maia Plan recommends discharge with antibiotics.  Will cover with Augmentin and have the patient follow-up with Dr. Lorin Picket in the office.  Patient agreeable to plan of care.  FINAL CLINICAL IMPRESSION(S) / ED DIAGNOSES   Perianal abscess    Note:  This document was prepared using Dragon voice recognition software and may include unintentional dictation errors.   Minna Antis, MD 11/06/22 Serena Croissant

## 2022-11-06 NOTE — ED Triage Notes (Signed)
Pt here with a perineal abscess. Pt states she had a drain for a month and it was healing fine until last Sunday. Pt states the same pain is back and she thinks the wound is getting bigger in size and is also making both of her legs hurt. Pt denies drainage but states it is painful to touch.

## 2022-11-06 NOTE — ED Notes (Signed)
Pt's urine pregnancy was negative

## 2022-11-09 LAB — POC URINE PREG, ED: Preg Test, Ur: NEGATIVE

## 2023-02-11 ENCOUNTER — Other Ambulatory Visit: Payer: Self-pay | Admitting: Gastroenterology

## 2023-02-11 DIAGNOSIS — K6289 Other specified diseases of anus and rectum: Secondary | ICD-10-CM

## 2023-02-11 DIAGNOSIS — R197 Diarrhea, unspecified: Secondary | ICD-10-CM

## 2023-02-11 DIAGNOSIS — R1084 Generalized abdominal pain: Secondary | ICD-10-CM

## 2023-02-22 ENCOUNTER — Encounter: Payer: Self-pay | Admitting: Gastroenterology

## 2023-02-23 ENCOUNTER — Ambulatory Visit
Admission: RE | Admit: 2023-02-23 | Discharge: 2023-02-23 | Discharge status: Home / Self Care | Payer: Medicaid Other | Source: Ambulatory Visit | Attending: Gastroenterology | Admitting: Gastroenterology

## 2023-02-23 ENCOUNTER — Ambulatory Visit
Admission: RE | Admit: 2023-02-23 | Discharge: 2023-02-23 | Disposition: A | Discharge status: Home / Self Care | Payer: Medicaid Other | Source: Ambulatory Visit | Attending: Gastroenterology | Admitting: Gastroenterology

## 2023-02-23 DIAGNOSIS — R1084 Generalized abdominal pain: Secondary | ICD-10-CM | POA: Insufficient documentation

## 2023-02-23 DIAGNOSIS — K6289 Other specified diseases of anus and rectum: Secondary | ICD-10-CM | POA: Insufficient documentation

## 2023-02-23 DIAGNOSIS — R197 Diarrhea, unspecified: Secondary | ICD-10-CM | POA: Insufficient documentation

## 2023-02-23 MED ORDER — GADOBUTROL 1 MMOL/ML IV SOLN
10.0000 mL | Freq: Once | INTRAVENOUS | Status: AC | PRN
  Administered 2023-02-23: 10 mL via INTRAVENOUS

## 2023-04-01 ENCOUNTER — Emergency Department: Payer: Medicaid Other

## 2023-04-01 ENCOUNTER — Encounter: Payer: Self-pay | Admitting: Emergency Medicine

## 2023-04-01 ENCOUNTER — Observation Stay
Admission: EM | Admit: 2023-04-01 | Discharge: 2023-04-02 | Disposition: A | Discharge status: Home / Self Care | Payer: Medicaid Other | Attending: Emergency Medicine | Admitting: Emergency Medicine

## 2023-04-01 ENCOUNTER — Other Ambulatory Visit: Payer: Self-pay

## 2023-04-01 ENCOUNTER — Emergency Department
Admission: EM | Admit: 2023-04-01 | Discharge: 2023-04-01 | Disposition: A | Discharge status: Home / Self Care | Payer: Medicaid Other | Source: Home / Self Care | Attending: Emergency Medicine | Admitting: Emergency Medicine

## 2023-04-01 ENCOUNTER — Encounter: Payer: Self-pay | Admitting: Intensive Care

## 2023-04-01 DIAGNOSIS — E0590 Thyrotoxicosis, unspecified without thyrotoxic crisis or storm: Principal | ICD-10-CM | POA: Diagnosis present

## 2023-04-01 DIAGNOSIS — G43909 Migraine, unspecified, not intractable, without status migrainosus: Secondary | ICD-10-CM | POA: Insufficient documentation

## 2023-04-01 DIAGNOSIS — I10 Essential (primary) hypertension: Secondary | ICD-10-CM | POA: Insufficient documentation

## 2023-04-01 DIAGNOSIS — E785 Hyperlipidemia, unspecified: Secondary | ICD-10-CM | POA: Diagnosis not present

## 2023-04-01 DIAGNOSIS — Z79899 Other long term (current) drug therapy: Secondary | ICD-10-CM

## 2023-04-01 DIAGNOSIS — R000 Tachycardia, unspecified: Secondary | ICD-10-CM | POA: Insufficient documentation

## 2023-04-01 DIAGNOSIS — R519 Headache, unspecified: Secondary | ICD-10-CM | POA: Insufficient documentation

## 2023-04-01 DIAGNOSIS — G4700 Insomnia, unspecified: Secondary | ICD-10-CM | POA: Diagnosis present

## 2023-04-01 DIAGNOSIS — G43009 Migraine without aura, not intractable, without status migrainosus: Secondary | ICD-10-CM

## 2023-04-01 DIAGNOSIS — G43A0 Cyclical vomiting, not intractable: Secondary | ICD-10-CM | POA: Diagnosis present

## 2023-04-01 DIAGNOSIS — E0500 Thyrotoxicosis with diffuse goiter without thyrotoxic crisis or storm: Secondary | ICD-10-CM | POA: Diagnosis not present

## 2023-04-01 DIAGNOSIS — I160 Hypertensive urgency: Secondary | ICD-10-CM | POA: Diagnosis present

## 2023-04-01 DIAGNOSIS — F17210 Nicotine dependence, cigarettes, uncomplicated: Secondary | ICD-10-CM | POA: Diagnosis present

## 2023-04-01 DIAGNOSIS — Z823 Family history of stroke: Secondary | ICD-10-CM

## 2023-04-01 DIAGNOSIS — Z5329 Procedure and treatment not carried out because of patient's decision for other reasons: Secondary | ICD-10-CM | POA: Diagnosis not present

## 2023-04-01 LAB — BASIC METABOLIC PANEL
Anion gap: 9 (ref 5–15)
BUN: 14 mg/dL (ref 6–20)
CO2: 20 mmol/L — ABNORMAL LOW (ref 22–32)
Calcium: 9.4 mg/dL (ref 8.9–10.3)
Chloride: 111 mmol/L (ref 98–111)
Creatinine, Ser: 0.57 mg/dL (ref 0.44–1.00)
GFR, Estimated: >60 mL/min (ref >60)
Glucose, Bld: 108 mg/dL — ABNORMAL HIGH (ref 70–99)
Potassium: 3.8 mmol/L (ref 3.5–5.1)
Sodium: 140 mmol/L (ref 135–145)

## 2023-04-01 LAB — HEPATIC FUNCTION PANEL
ALT: 49 U/L — ABNORMAL HIGH (ref 0–44)
AST: 36 U/L (ref 15–41)
Albumin: 3.9 g/dL (ref 3.5–5.0)
Alkaline Phosphatase: 67 U/L (ref 38–126)
Bilirubin, Direct: <0.1 mg/dL (ref 0.0–0.2)
Total Bilirubin: 0.6 mg/dL (ref 0.0–1.2)
Total Protein: 7.2 g/dL (ref 6.5–8.1)

## 2023-04-01 LAB — POC URINE PREG, ED: Preg Test, Ur: NEGATIVE

## 2023-04-01 LAB — CBC
HCT: 44.9 % (ref 36.0–46.0)
Hemoglobin: 15.2 g/dL — ABNORMAL HIGH (ref 12.0–15.0)
MCH: 30 pg (ref 26.0–34.0)
MCHC: 33.9 g/dL (ref 30.0–36.0)
MCV: 88.7 fL (ref 80.0–100.0)
Platelets: 300 10*3/uL (ref 150–400)
RBC: 5.06 MIL/uL (ref 3.87–5.11)
RDW: 13.3 % (ref 11.5–15.5)
WBC: 6.3 10*3/uL (ref 4.0–10.5)
nRBC: 0 % (ref 0.0–0.2)

## 2023-04-01 LAB — TROPONIN I (HIGH SENSITIVITY)
Troponin I (High Sensitivity): 6 ng/L (ref <18)
Troponin I (High Sensitivity): 6 ng/L (ref <18)

## 2023-04-01 LAB — TSH: TSH: <0.01 u[IU]/mL — ABNORMAL LOW (ref 0.350–4.500)

## 2023-04-01 LAB — MAGNESIUM: Magnesium: 1.8 mg/dL (ref 1.7–2.4)

## 2023-04-01 LAB — T4, FREE: Free T4: 2.42 ng/dL — ABNORMAL HIGH (ref 0.61–1.12)

## 2023-04-01 MED ORDER — SODIUM CHLORIDE 0.9 % IV SOLN: INTRAVENOUS | Status: DC

## 2023-04-01 MED ORDER — ACETAMINOPHEN 325 MG PO TABS: 650.0000 mg | ORAL_TABLET | Freq: Four times a day (QID) | ORAL | Status: DC | PRN

## 2023-04-01 MED ORDER — PROPRANOLOL HCL 20 MG PO TABS
60.0000 mg | ORAL_TABLET | Freq: Once | ORAL | Status: AC
  Administered 2023-04-01: 60 mg via ORAL
  Filled 2023-04-01: qty 3

## 2023-04-01 MED ORDER — PROPRANOLOL HCL 20 MG PO TABS
20.0000 mg | ORAL_TABLET | Freq: Three times a day (TID) | ORAL | Status: DC
  Administered 2023-04-02: 20 mg via ORAL
  Filled 2023-04-01: qty 1

## 2023-04-01 MED ORDER — PROPYLTHIOURACIL 50 MG PO TABS
100.0000 mg | ORAL_TABLET | Freq: Three times a day (TID) | ORAL | Status: DC
  Administered 2023-04-02: 100 mg via ORAL
  Filled 2023-04-01 (×2): qty 2

## 2023-04-01 MED ORDER — ACETAMINOPHEN 325 MG RE SUPP: 650.0000 mg | Freq: Four times a day (QID) | RECTAL | Status: DC | PRN

## 2023-04-01 MED ORDER — PROPYLTHIOURACIL 50 MG PO TABS
350.0000 mg | ORAL_TABLET | ORAL | Status: AC
  Administered 2023-04-01: 350 mg via ORAL
  Filled 2023-04-01: qty 7

## 2023-04-01 MED ORDER — ONDANSETRON HCL 4 MG PO TABS: 4.0000 mg | ORAL_TABLET | Freq: Four times a day (QID) | ORAL | Status: DC | PRN

## 2023-04-01 MED ORDER — MAGNESIUM HYDROXIDE 400 MG/5ML PO SUSP: 30.0000 mL | Freq: Every day | ORAL | Status: DC | PRN

## 2023-04-01 MED ORDER — ONDANSETRON HCL 4 MG/2ML IJ SOLN
4.0000 mg | Freq: Four times a day (QID) | INTRAMUSCULAR | Status: DC | PRN
Start: 2023-04-01 — End: 2023-04-02

## 2023-04-01 MED ORDER — ATORVASTATIN CALCIUM 20 MG PO TABS
20.0000 mg | ORAL_TABLET | Freq: Every day | ORAL | Status: DC
  Administered 2023-04-01 – 2023-04-02 (×2): 20 mg via ORAL
  Filled 2023-04-01 (×2): qty 1

## 2023-04-01 MED ORDER — SUMATRIPTAN SUCCINATE 50 MG PO TABS: 50.0000 mg | ORAL_TABLET | ORAL | Status: DC | PRN

## 2023-04-01 MED ORDER — ENOXAPARIN SODIUM 60 MG/0.6ML IJ SOSY
0.5000 mg/kg | PREFILLED_SYRINGE | INTRAMUSCULAR | Status: DC
  Administered 2023-04-01: 47.5 mg via SUBCUTANEOUS
  Filled 2023-04-01: qty 0.6

## 2023-04-01 MED ORDER — PROPYLTHIOURACIL 50 MG PO TABS
500.0000 mg | ORAL_TABLET | Freq: Once | ORAL | Status: DC
Start: 2023-04-01 — End: 2023-04-01
  Filled 2023-04-01: qty 10

## 2023-04-01 MED ORDER — TOPIRAMATE 25 MG PO TABS
25.0000 mg | ORAL_TABLET | Freq: Two times a day (BID) | ORAL | Status: DC
  Administered 2023-04-01 – 2023-04-02 (×2): 25 mg via ORAL
  Filled 2023-04-01 (×2): qty 1

## 2023-04-01 MED ORDER — IOHEXOL 350 MG/ML SOLN
75.0000 mL | Freq: Once | INTRAVENOUS | Status: AC | PRN
  Administered 2023-04-01: 75 mL via INTRAVENOUS

## 2023-04-01 NOTE — ED Provider Notes (Addendum)
 [facility] Provider Note    Event Date/Time   First MD Initiated Contact with Patient 04/01/23 1234     (approximate)   History   Hypertension and Tachycardia   HPI  Theresa Carpenter is a 43 y.o. female  with HTN and tachycardia who comes in with elevated heart rate.  Patient reports chronic issues with her heart rate that she currently takes metoprolol  for.  She reports taking 50 mg once daily at nighttime around 6 PM.  She states that she has been compliant with her medications.  She is post plan for a colonoscopy and she needs to get clearance for her heart rate.  She states that over the past few days she has been having some intermittent headaches with the worst of the headache being 2 days ago and yesterday's headache being associated with some numbness and tingling.  She reports the tingling was in both arms.  Denied any chest pain, shortness of breath.  She states that the headache is severe when it first starts and then improves.  She reports it is a 10 out of 10.  She states that she typically takes ibuprofen  to try to help with the headache.  She reports a headache currently is a 4 out of 10.    Physical Exam   Triage Vital Signs: ED Triage Vitals  Encounter Vitals Group     BP 04/01/23 1022 (!) 163/125     Systolic BP Percentile --      Diastolic BP Percentile --      Pulse Rate 04/01/23 1022 (!) 139     Resp 04/01/23 1022 16     Temp 04/01/23 1026 98.2 F (36.8 C)     Temp Source 04/01/23 1026 Oral     SpO2 04/01/23 1022 96 %     Weight 04/01/23 1023 208 lb (94.3 kg)     Height 04/01/23 1023 5' 3 (1.6 m)     Head Circumference --      Peak Flow --      Pain Score 04/01/23 1023 0     Pain Loc --      Pain Education --      Exclude from Growth Chart --     Most recent vital signs: Vitals:   04/01/23 1022 04/01/23 1026  BP: (!) 163/125   Pulse: (!) 139   Resp: 16   Temp:  98.2 F (36.8 C)  SpO2: 96%      General: Awake, no  distress.  CV:  Good peripheral perfusion.  Resp:  Normal effort.  Abd:  No distention.  Other:  Cranor to the 12 are intact.  Equal strength in arms and legs.   ED Results / Procedures / Treatments   Labs (all labs ordered are listed, but only abnormal results are displayed) Labs Reviewed  BASIC METABOLIC PANEL - Abnormal; Notable for the following components:      Result Value   CO2 20 (*)    Glucose, Bld 108 (*)    All other components within normal limits  CBC - Abnormal; Notable for the following components:   Hemoglobin 15.2 (*)    All other components within normal limits  POC URINE PREG, ED  TROPONIN I (HIGH SENSITIVITY)  TROPONIN I (HIGH SENSITIVITY)     EKG  My interpretation of EKG:  Sinus tachycardia rate 130 without any ST elevation or T wave inversions, normal intervals  RADIOLOGY   PROCEDURES:  Critical Care performed:  No  Procedures   MEDICATIONS ORDERED IN ED: Medications - No data to display   IMPRESSION / MDM / ASSESSMENT AND PLAN / ED COURSE  I reviewed the triage vital signs and the nursing notes.   Patient's presentation is most consistent with acute presentation with potential threat to life or bodily function.  Patient comes in significantly tachycardic, hypertensive with intermittent new headaches with sensation changes in her bilateral upper extremities.  Seems concerning for subarachnoid hemorrhage.  However I did discuss with patient that this could also be a complex migraine.  Recommended CT angio given the worst of the headache was over 24 hours ago to evaluate for any aneurysms.  Patient states that she needs to leave.  She states that she needs to go pick up her mom from work.  She states there is nobody else that can pick up her mom.  We discussed at least doing a CT head to make sure no intracranial hemorrhage but she states that even if it was positive she would still need to leave to go pick up her mom.  We discussed also getting  treatments for her tachycardia, hypertension through the IV.  I have lower suspicion for PE given she denies any shortness of breath.  Patient states that she is typically a very tough IV stick and she does not want to be stuck here and then have to leave to go pick up her mom and then have to come back.  She prefers to have her workup done later and prefers to leave now and will return after she picks up her mom.  She understands that there is a risk for death, permanent disability, stroke but at this time she is declined any additional workup.  Patient has capacity make this decision and is adamant that she will return for further workup and evaluation after picking up her mom.  She understands that she will need to wait in the waiting room again but she states that she has no other options.  We discussed trying to get her mom and neighbor but she states that her mom would not do that and there is no other family members.  Therefore at this time patient will be discharged and patient will return after picking up her mom.  In the meantime we did discuss taking 2 of her metoprolol  now to try to help with her heart rates and I have added on some blood work to look at her thyroid , magnesium  for the next provider to see.  Patient understands that she is leaving against my medical advice.  Thyroid  testing and mag added on to hopefully help next provider when pt plans to return.  Troponin negative BNP slightly low bicarb.  CBC reassuring    FINAL CLINICAL IMPRESSION(S) / ED DIAGNOSES   Final diagnoses:  Tachycardia  Intractable headache, unspecified chronicity pattern, unspecified headache type     Rx / DC Orders   ED Discharge Orders     None        Note:  This document was prepared using Dragon voice recognition software and may include unintentional dictation errors.   Ernest Ronal BRAVO, MD 04/01/23 1311    Ernest Ronal BRAVO, MD 04/02/23 [phone]

## 2023-04-01 NOTE — ED Provider Triage Note (Signed)
 Emergency Medicine Provider Triage Evaluation Note  GENENE KILMAN , a 43 y.o. female  was evaluated in triage.  Pt complains of headache and tachycardia, patient left AMA earlier today to take care of things with her mom. Dr. Ernest was recommending she stay for CTA to rule out a subarachnoid.  Review of Systems  Positive: Headache,  Negative: CP, palpitations  Physical Exam  BP (!) 197/129 (BP Location: Left Wrist)   Pulse (!) 145   Temp 98.5 F (36.9 C) (Oral)   Resp 20   Ht 5' (1.524 m)   Wt 94.3 kg   LMP 03/28/2023 (Exact Date)   SpO2 99%   BMI 40.62 kg/m  Gen:   Awake, no distress   Resp:  Normal effort  MSK:   Moves extremities without difficulty  Other:    Medical Decision Making  Medically screening exam initiated at 4:02 PM.  Appropriate orders placed.  Jesusa LULLA Gavel was informed that the remainder of the evaluation will be completed by another provider, this initial triage assessment does not replace that evaluation, and the importance of remaining in the ED until their evaluation is complete.     Cleaster Tinnie LABOR, PA-C 04/01/23 1605

## 2023-04-01 NOTE — ED Notes (Signed)
 Pt back from CT, her IV line was pulled out and they couldn't perform CT. When asked what happened Pt stated she was rough handled and she felt the IV come out. This RN called CT and they apologized for the movement. U/[employer] certified RN came to bedside to place IV.

## 2023-04-01 NOTE — ED Triage Notes (Signed)
 Patient presents with high heart rate and hypertension. Reports taking two metoprolol  50mg  today.   The last few days has been having headaches.  Left AMA earlier to go pick up her mom and told to come back to ER for more tests.

## 2023-04-01 NOTE — ED Provider Notes (Signed)
 [facility] Provider Note    Event Date/Time   First MD Initiated Contact with Patient 04/01/23 1612     (approximate)   History   Headache and Hypertension   HPI  Theresa Carpenter is a 43 y.o. female who presents to the ED for evaluation of Headache and Hypertension   I reviewed PCP visit from this morning and ED visit from a few hours ago where she left AMA for the same, but has again come back. Tachycardic chronically on metoprolol  with history of HTN.  Presented with new headaches and sensation changes to her bilateral upper extremities but patient left AMA prior to CT scan.  I reviewed blood work from this morning with a normal metabolic panel, normal CBC, negative troponin  Low TSH and high free T4  Patient returns to the ED due to resolving headache, hypertension and tachycardia for the past few months.  Reports that she had some tingling to her bilateral upper extremities earlier in the day but this has since resolved.  Headache is improved and now only 3/10 intensity.   Physical Exam   Triage Vital Signs: ED Triage Vitals  Encounter Vitals Group     BP 04/01/23 1557 (!) 197/129     Systolic BP Percentile --      Diastolic BP Percentile --      Pulse Rate 04/01/23 1557 (!) 145     Resp 04/01/23 1557 20     Temp 04/01/23 1557 98.5 F (36.9 C)     Temp Source 04/01/23 1557 Oral     SpO2 04/01/23 1557 99 %     Weight 04/01/23 1558 208 lb (94.3 kg)     Height 04/01/23 1558 5' (1.524 m)     Head Circumference --      Peak Flow --      Pain Score 04/01/23 1558 3     Pain Loc --      Pain Education --      Exclude from Growth Chart --     Most recent vital signs: Vitals:   04/01/23 1615 04/01/23 1718  BP: (!) 149/109 (!) 149/109  Pulse: (!) 125 (!) 124  Resp:  20  Temp:  98.4 F (36.9 C)  SpO2: 97% 100%    General: Awake, no distress.  CV:  Good peripheral perfusion.  Resp:  Normal effort.  Abd:  No distention.  MSK:  No  deformity noted.  Neuro:  No focal deficits appreciated. Cranial nerves II through XII intact 5/5 strength and sensation in all 4 extremities Other:     ED Results / Procedures / Treatments   Labs (all labs ordered are listed, but only abnormal results are displayed) Labs Reviewed - No data to display  EKG Sinus tachycardia with a rate of 130 bpm.  Normal axis and intervals.  No clear signs of acute ischemia.  RADIOLOGY CT angio neck and head interpreted by me without signs of ICH or aneurysm.  Official radiology report(s): CT Angio Head Neck W WO CM Result Date: 04/01/2023 CLINICAL DATA:  High blood pressure and tachycardia, numbness in both arms, frequent headaches EXAM: CT ANGIOGRAPHY HEAD AND NECK WITH AND WITHOUT CONTRAST TECHNIQUE: Multidetector CT imaging of the head and neck was performed using the standard protocol during bolus administration of intravenous contrast. Multiplanar CT image reconstructions and MIPs were obtained to evaluate the vascular anatomy. Carotid stenosis measurements (when applicable) are obtained utilizing NASCET criteria, using the distal internal carotid diameter  as the denominator. RADIATION DOSE REDUCTION: This exam was performed according to the departmental dose-optimization program which includes automated exposure control, adjustment of the mA and/or kV according to patient size and/or use of iterative reconstruction technique. CONTRAST:  75mL OMNIPAQUE  IOHEXOL  350 MG/ML SOLN COMPARISON:  05/31/2007 CT head, no prior CTA FINDINGS: CT HEAD FINDINGS Brain: No evidence of acute infarct, hemorrhage, mass, mass effect, or midline shift. No hydrocephalus or extra-axial fluid collection. Pituitary and craniocervical junction within normal limits. Vascular: No hyperdense vessel. Skull: Negative for fracture or focal lesion. Sinuses/Orbits: No acute finding. Other: The mastoid air cells are well aerated. CTA NECK FINDINGS Aortic arch: Two-vessel arch with a common  origin of the brachiocephalic and left common carotid arteries. Imaged portion shows no evidence of aneurysm or dissection. No significant stenosis of the major arch vessel origins. Right carotid system: No evidence of stenosis, dissection, or occlusion. Left carotid system: No evidence of stenosis, dissection, or occlusion. Vertebral arteries: No evidence of stenosis, dissection, or occlusion. Skeleton: No acute osseous abnormality. Other neck: No acute finding. Upper chest: No focal pulmonary opacity or pleural effusion. Review of the MIP images confirms the above findings CTA HEAD FINDINGS Anterior circulation: Both internal carotid arteries are patent to the termini, without significant stenosis. A1 segments patent. Normal anterior communicating artery. Anterior cerebral arteries are patent to their distal aspects without significant stenosis. No M1 stenosis or occlusion. MCA branches perfused to their distal aspects without significant stenosis. Posterior circulation: Vertebral arteries patent to the vertebrobasilar junction without significant stenosis. The left PICA is patent. The right PICA is not well seen. Basilar patent to its distal aspect without significant stenosis. Superior cerebellar arteries patent proximally. Patent P1 segments. PCAs perfused to their distal aspects without significant stenosis. The bilateral posterior communicating arteries are diminutive but patent. Venous sinuses: As permitted by contrast timing, patent. Anatomic variants: None significant. No evidence of aneurysm or vascular malformation. Review of the MIP images confirms the above findings IMPRESSION: 1. No acute intracranial process. 2. No intracranial large vessel occlusion or significant stenosis. No evidence of aneurysm or vascular malformation. 3. No hemodynamically significant stenosis in the neck. Electronically Signed   By: Donald Campion M.D.   On: 04/01/2023 18:58   DG Chest 2 View Result Date: 04/01/2023 CLINICAL  DATA:  43 year old female with tachycardia, hypertension. EXAM: CHEST - 2 VIEW COMPARISON:  Chest radiograph 02/12/2018 and earlier. FINDINGS: PA and lateral views 1105 hours. Low lung volumes. Cardiac and mediastinal contours remain normal. Visualized tracheal air column is within normal limits. Curvilinear bilateral perihilar opacity most resembling atelectasis. No pneumothorax, pulmonary edema, pleural effusion or other confluent opacity. Stable cholecystectomy clips. No acute osseous abnormality identified. Negative visible bowel gas. IMPRESSION: Low lung volumes with perihilar atelectasis. No other cardiopulmonary abnormality. Electronically Signed   By: VEAR Hurst M.D.   On: 04/01/2023 11:34    PROCEDURES and INTERVENTIONS:  .1-3 Lead EKG Interpretation  Performed by: Claudene Rover, MD Authorized by: Claudene Rover, MD     Interpretation: abnormal     ECG rate:  121   ECG rate assessment: tachycardic     Rhythm: sinus tachycardia     Ectopy: none     Conduction: normal   .Critical Care  Performed by: Claudene Rover, MD Authorized by: Claudene Rover, MD   Critical care provider statement:    Critical care time (minutes):  30   Critical care time was exclusive of:  Separately billable procedures and treating other patients  Critical care was necessary to treat or prevent imminent or life-threatening deterioration of the following conditions:  Endocrine crisis   Critical care was time spent personally by me on the following activities:  Development of treatment plan with patient or surrogate, discussions with consultants, evaluation of patient's response to treatment, examination of patient, ordering and review of laboratory studies, ordering and review of radiographic studies, ordering and performing treatments and interventions, pulse oximetry, re-evaluation of patient's condition and review of old charts   Medications  propranolol  (INDERAL ) tablet 60 mg (60 mg Oral Given 04/01/23 1651)   propylthiouracil  (PTU) tablet 350 mg (350 mg Oral Given 04/01/23 1710)  iohexol  (OMNIPAQUE ) 350 MG/ML injection 75 mL (75 mLs Intravenous Contrast Given 04/01/23 1801)     IMPRESSION / MDM / ASSESSMENT AND PLAN / ED COURSE  I reviewed the triage vital signs and the nursing notes.  Differential diagnosis includes, but is not limited to, ICH such as SAH, hyperthyroidism, tension headache, meningitis or encephalitis   {Patient presents with symptoms of an acute illness or injury that is potentially life-threatening.  Patient presents with headache, hypertension and tachycardia with evidence of hyperthyroidism requiring medical admission.  No signs of neurologic deficits or trauma.  CT imaging, as above is reassuring.  Blood work from earlier today with stigmata of hyperthyroidism with a likely etiology of her symptoms.  Due to her continued tachycardia, hypertension, headache we will consult medicine for admission.  Initiated propranolol  and PTU  Clinical Course as of 04/01/23 1930  Thu Apr 01, 2023  1641 Consult with pharmacy. We only have x7 tablets of the 50mg  PTU in the hospital [DS]  1722 USIV left basilic v [DS]  8191 Apparently her USIV fell out or somehow was displaced [DS]  1929 Consult with medicine [DS]    Clinical Course User Index [DS] Claudene Rover, MD     FINAL CLINICAL IMPRESSION(S) / ED DIAGNOSES   Final diagnoses:  Hyperthyroidism     Rx / DC Orders   ED Discharge Orders     None        Note:  This document was prepared using Dragon voice recognition software and may include unintentional dictation errors.   Claudene Rover, MD 04/01/23 1910

## 2023-04-01 NOTE — ED Triage Notes (Signed)
 Pt to ED via [facility] for high blood pressure and tachycardia. Pt states that she has hx/o same. Pt reports that she has been on metoprolol  for years but it does not seem to be working anymore. Pt states that yesterday she had some numbness in both arms that last for about 2 minutes and that she frequently gets headaches. Pt denies chest pain. Pt is ambulatory and in NAD.

## 2023-04-01 NOTE — H&P (Addendum)
 [facility]   PATIENT NAME: Theresa Carpenter    MR#:  [id number]  DATE OF BIRTH:  10-11-1980  DATE OF ADMISSION:  04/01/2023  PRIMARY CARE PHYSICIAN: Geralene Levorn ORN, NP   Patient is coming from: Home  REQUESTING/REFERRING PHYSICIAN: Claudene Rover, MD  CHIEF COMPLAINT:   Chief Complaint  Patient presents with  . Headache  . Hypertension    HISTORY OF PRESENT ILLNESS:  Theresa Carpenter is a 43 y.o. African-American female with medical history significant for essential hypertension and cyclic vomiting, who presented to the emergency room with acute onset of palpitations with headache, jitteriness and nervousness as well as occasional tremors which have been going on lately.  She admitted to insomnia without blurred vision.  She admits to bilateral upper extremity paresthesias that occurred last night.  Her palpitations has been going on for a while.  No fever or chills.  No nausea or vomiting or abdominal pain.  No diarrhea or melena or bright red per rectum.  No dysuria, oliguria or hematuria or flank pain.  She denied any chest pain with palpitations.  No dyspnea or cough or wheezing or hemoptysis.  ED Course: When the patient came to the ER, BP was 163/105 with heart rate 139 with otherwise normal vital signs.  Labs revealed CO2 of 20 and ALT 49 with otherwise unremarkable CMP.  High sensitive troponin I was 6.  CBC showed mild hemoconcentration.  TSH was less than 0.01 and free T4 was elevated at 2.42.  Urine pregnancy test was negative. EKG as reviewed by me : EKG showed sinus tachycardia with rate 130 with septal Q waves. Imaging: CT of the head and neck showed no acute intracranial process and no intracranial large vessel occlusion or significant stenosis with no evidence for aneurysm or vascular formation.  The patient was given 60 mg of p.o. propranolol  and 350 mg of PTU.  She will be admitted to a progressive unit observation bed for further evaluation and management. PAST  MEDICAL HISTORY:   Past Medical History:  Diagnosis Date  . Cyclic vomiting syndrome   . Family history of adverse reaction to anesthesia    BROTHER-N/V  . Hypertension   . Tachycardia     PAST SURGICAL HISTORY:   Past Surgical History:  Procedure Laterality Date  . CHOLECYSTECTOMY    . HEMORRHOID SURGERY N/A 01/27/2017   Procedure: HEMORRHOIDECTOMY;  Surgeon: Rodolph Romano, MD;  Location: [facility] ORS;  Service: General;  Laterality: N/A;  . INCISION AND DRAINAGE PERIRECTAL ABSCESS N/A 08/27/2022   Procedure: IRRIGATION AND DEBRIDEMENT PERIRECTAL ABSCESS;  Surgeon: Tye Millet, DO;  Location: [facility] ORS;  Service: General;  Laterality: N/A;  . RECTAL EXAM UNDER ANESTHESIA N/A 01/27/2017   Procedure: RECTAL EXAM UNDER ANESTHESIA;  Surgeon: Rodolph Romano, MD;  Location: [facility] ORS;  Service: General;  Laterality: N/A;    SOCIAL HISTORY:   Social History   Tobacco Use  . Smoking status: Every Day    Current packs/day: 0.30    Average packs/day: 0.3 packs/day for 10.0 years (3.0 ttl pk-yrs)    Types: Cigarettes  . Smokeless tobacco: Never  Substance Use Topics  . Alcohol use: Yes    FAMILY HISTORY:  Positive for thyroid  disease in her mother and CVA in her paternal grandfather.  DRUG ALLERGIES:  No Known Allergies  REVIEW OF SYSTEMS:   ROS As per history of present illness. All pertinent systems were reviewed above. Constitutional, HEENT, cardiovascular, respiratory, GI, GU, musculoskeletal, neuro,  psychiatric, endocrine, integumentary and hematologic systems were reviewed and are otherwise negative/unremarkable except for positive findings mentioned above in the HPI.   MEDICATIONS AT HOME:   Prior to Admission medications   Medication Sig Start Date End Date Taking? Authorizing Provider  amoxicillin -clavulanate (AUGMENTIN ) 875-125 MG tablet Take 1 tablet by mouth 2 (two) times daily. 11/06/22   Dorothyann Drivers, MD  atorvastatin  (LIPITOR) 20 MG tablet Take 1  tablet by mouth daily. 08/17/22 08/17/23  [provider]  HYDROcodone -acetaminophen  (NORCO/VICODIN) 5-325 MG tablet Take 1 tablet by mouth every 4 (four) hours as needed. 11/06/22   Dorothyann Drivers, MD  hydrOXYzine  (ATARAX /VISTARIL ) 25 MG tablet Take 25 mg by mouth 3 (three) times daily. Patient not taking: Reported on 08/27/2022    [provider]  metoprolol  succinate (TOPROL -XL) 50 MG 24 hr tablet Take 50 mg by mouth daily.  09/15/16   [provider]  naproxen  sodium (ALEVE ) 220 MG tablet Take 220 mg by mouth.    [provider]  ondansetron  (ZOFRAN  ODT) 4 MG disintegrating tablet Take 1 tablet (4 mg total) by mouth every 8 (eight) hours as needed for nausea or vomiting. Patient not taking: Reported on 08/27/2022 02/11/18   Arlander Charleston, MD  promethazine  (PHENERGAN ) 25 MG tablet Take 25 mg by mouth every 6 (six) hours as needed for nausea. 07/30/16   [provider]  rizatriptan (MAXALT) 5 MG tablet Take 5 mg by mouth as needed (vomiting syndrome). May repeat in 2 hours if needed    [provider]  topiramate  (TOPAMAX ) 25 MG tablet Take 25 mg by mouth 2 (two) times daily. 09/14/16   [provider]      VITAL SIGNS:  Blood pressure (!) 155/108, pulse (!) 118, temperature 98.6 F (37 C), temperature source Oral, resp. rate 20, height 5' (1.524 m), weight 94.3 kg, last menstrual period 03/28/2023, SpO2 100%.  PHYSICAL EXAMINATION:  Physical Exam  GENERAL:  43 y.o.-year-old African-American female patient lying in the bed with no acute distress.  EYES: Pupils equal, round, reactive to light and accommodation. No scleral icterus. Extraocular muscles intact.  HEENT: Head atraumatic, normocephalic. Oropharynx and nasopharynx clear.  NECK:  Supple, no jugular venous distention.  Positive diffuse thyromegaly.  No palpable thrill.  No tenderness.  LUNGS: Normal breath sounds bilaterally, no wheezing, rales,rhonchi or crepitation. No use  of accessory muscles of respiration.  CARDIOVASCULAR: Regular rate and rhythm, S1, S2 normal. No murmurs, rubs, or gallops.  ABDOMEN: Soft, nondistended, nontender. Bowel sounds present. No organomegaly or mass.  EXTREMITIES: No pedal edema, cyanosis, or clubbing.  NEUROLOGIC: Cranial nerves II through XII are intact. Muscle strength 5/5 in all extremities. Sensation intact. Gait not checked.  PSYCHIATRIC: The patient is alert and oriented x 3.  Normal affect and good eye contact. SKIN: No obvious rash, lesion, or ulcer.   LABORATORY PANEL:   CBC Recent Labs  Lab 04/01/23 1051  WBC 6.3  HGB 15.2*  HCT 44.9  PLT 300   ------------------------------------------------------------------------------------------------------------------  Chemistries  Recent Labs  Lab 04/01/23 1051  NA 140  K 3.8  CL 111  CO2 20*  GLUCOSE 108*  BUN 14  CREATININE 0.57  CALCIUM  9.4  MG 1.8  AST 36  ALT 49*  ALKPHOS 67  BILITOT 0.6   ------------------------------------------------------------------------------------------------------------------  Cardiac Enzymes No results for input(s): TROPONINI in the last 168 hours. ------------------------------------------------------------------------------------------------------------------  RADIOLOGY:  CT Angio Head Neck W WO CM Result Date: 04/01/2023 CLINICAL DATA:  High blood pressure and  tachycardia, numbness in both arms, frequent headaches EXAM: CT ANGIOGRAPHY HEAD AND NECK WITH AND WITHOUT CONTRAST TECHNIQUE: Multidetector CT imaging of the head and neck was performed using the standard protocol during bolus administration of intravenous contrast. Multiplanar CT image reconstructions and MIPs were obtained to evaluate the vascular anatomy. Carotid stenosis measurements (when applicable) are obtained utilizing NASCET criteria, using the distal internal carotid diameter as the denominator. RADIATION DOSE REDUCTION: This exam was performed  according to the departmental dose-optimization program which includes automated exposure control, adjustment of the mA and/or kV according to patient size and/or use of iterative reconstruction technique. CONTRAST:  75mL OMNIPAQUE  IOHEXOL  350 MG/ML SOLN COMPARISON:  05/31/2007 CT head, no prior CTA FINDINGS: CT HEAD FINDINGS Brain: No evidence of acute infarct, hemorrhage, mass, mass effect, or midline shift. No hydrocephalus or extra-axial fluid collection. Pituitary and craniocervical junction within normal limits. Vascular: No hyperdense vessel. Skull: Negative for fracture or focal lesion. Sinuses/Orbits: No acute finding. Other: The mastoid air cells are well aerated. CTA NECK FINDINGS Aortic arch: Two-vessel arch with a common origin of the brachiocephalic and left common carotid arteries. Imaged portion shows no evidence of aneurysm or dissection. No significant stenosis of the major arch vessel origins. Right carotid system: No evidence of stenosis, dissection, or occlusion. Left carotid system: No evidence of stenosis, dissection, or occlusion. Vertebral arteries: No evidence of stenosis, dissection, or occlusion. Skeleton: No acute osseous abnormality. Other neck: No acute finding. Upper chest: No focal pulmonary opacity or pleural effusion. Review of the MIP images confirms the above findings CTA HEAD FINDINGS Anterior circulation: Both internal carotid arteries are patent to the termini, without significant stenosis. A1 segments patent. Normal anterior communicating artery. Anterior cerebral arteries are patent to their distal aspects without significant stenosis. No M1 stenosis or occlusion. MCA branches perfused to their distal aspects without significant stenosis. Posterior circulation: Vertebral arteries patent to the vertebrobasilar junction without significant stenosis. The left PICA is patent. The right PICA is not well seen. Basilar patent to its distal aspect without significant stenosis.  Superior cerebellar arteries patent proximally. Patent P1 segments. PCAs perfused to their distal aspects without significant stenosis. The bilateral posterior communicating arteries are diminutive but patent. Venous sinuses: As permitted by contrast timing, patent. Anatomic variants: None significant. No evidence of aneurysm or vascular malformation. Review of the MIP images confirms the above findings IMPRESSION: 1. No acute intracranial process. 2. No intracranial large vessel occlusion or significant stenosis. No evidence of aneurysm or vascular malformation. 3. No hemodynamically significant stenosis in the neck. Electronically Signed   By: Donald Campion M.D.   On: 04/01/2023 18:58   DG Chest 2 View Result Date: 04/01/2023 CLINICAL DATA:  43 year old female with tachycardia, hypertension. EXAM: CHEST - 2 VIEW COMPARISON:  Chest radiograph 02/12/2018 and earlier. FINDINGS: PA and lateral views 1105 hours. Low lung volumes. Cardiac and mediastinal contours remain normal. Visualized tracheal air column is within normal limits. Curvilinear bilateral perihilar opacity most resembling atelectasis. No pneumothorax, pulmonary edema, pleural effusion or other confluent opacity. Stable cholecystectomy clips. No acute osseous abnormality identified. Negative visible bowel gas. IMPRESSION: Low lung volumes with perihilar atelectasis. No other cardiopulmonary abnormality. Electronically Signed   By: VEAR Hurst M.D.   On: 04/01/2023 11:34      IMPRESSION AND PLAN:  Assessment and Plan: * Thyrotoxicosis - The patient has new onset hyperthyroidism. - The patient will be admitted to an observation progressive unit bed. - We will continue her on p.o. propranolol  and  PTU. - Will check free T3, thyroglobulin antibody, thyroid  peroxidase antibody, TSI, and sed rate. - We will obtain a thyroid  ultrasound to start with given her thyromegaly.  She may need to be thyroid  scan and radioactive iodine uptake which can be done  on outpatient basis. - She will need an outpatient endocrinology appointment upon discharge.  Hypertensive urgency - We will continue antihypertensive therapy and place her on as needed IV labetalol .  Dyslipidemia - We will continue statin therapy.  Migraine Will continue Topamax  and Maxalt.   DVT prophylaxis: Lovenox .  Advanced Care Planning:  Code Status: full code.  Family Communication:  The plan of care was discussed in details with the patient (and family). I answered all questions. The patient agreed to proceed with the above mentioned plan. Further management will depend upon hospital course. Disposition Plan: Back to previous home environment Consults called: none.  All the records are reviewed and case discussed with ED provider.  Status is: Observation  I certify that at the time of admission, it is my clinical judgment that the patient will require hospital care extending less than 2 midnights.                            Dispo: The patient is from: Home              Anticipated d/c is to: Home              Patient currently is not medically stable to d/c.              Difficult to place patient: No  Madison DELENA Peaches M.D on 04/02/2023 at 12:16 AM  [facility]   From 7 PM-7 AM, contact night-coverage [email or web address]  CC: Primary care physician; Geralene Levorn ORN, NP

## 2023-04-01 NOTE — Discharge Instructions (Addendum)
 We discussed staying in the emergency room to get IV treatments for your blood pressure, heart rate as well as CT imaging to make sure that there was no bleeding in the brain, aneurysm.  You have opted to leave due to need to pick up your mom.  You are leaving against our advice.  There is a risk for death, permanent disability.  Please return to the ER after giving your mom to have workup completed and these abnormal vital signs addressed.

## 2023-04-01 NOTE — ED Notes (Signed)
 Pt off to CT.

## 2023-04-01 NOTE — Progress Notes (Signed)
 PHARMACIST - PHYSICIAN COMMUNICATION  CONCERNING:  Enoxaparin  (Lovenox ) for DVT Prophylaxis    RECOMMENDATION: Patient was prescribed enoxaprin 40mg  q24 hours for VTE prophylaxis.   Filed Weights   04/01/23 1558  Weight: 94.3 kg (208 lb)    Body mass index is 40.62 kg/m.  Estimated Creatinine Clearance: 95 mL/min (by C-G formula based on SCr of 0.57 mg/dL).   Based on [facility] policy patient is candidate for enoxaparin  0.5mg /kg TBW SQ every 24 hours based on BMI being >30.  DESCRIPTION: Pharmacy has adjusted enoxaparin  dose per [facility] policy.  Patient is now receiving enoxaparin  47.5 mg every 24 hours    Damien Napoleon, PharmD Clinical Pharmacist  04/01/2023 8:56 PM

## 2023-04-01 NOTE — ED Notes (Addendum)
 Pt to CT

## 2023-04-02 ENCOUNTER — Observation Stay: Payer: Medicaid Other

## 2023-04-02 ENCOUNTER — Encounter: Payer: Self-pay | Admitting: Family Medicine

## 2023-04-02 DIAGNOSIS — E0590 Thyrotoxicosis, unspecified without thyrotoxic crisis or storm: Secondary | ICD-10-CM

## 2023-04-02 DIAGNOSIS — E785 Hyperlipidemia, unspecified: Secondary | ICD-10-CM

## 2023-04-02 DIAGNOSIS — G43909 Migraine, unspecified, not intractable, without status migrainosus: Secondary | ICD-10-CM | POA: Insufficient documentation

## 2023-04-02 DIAGNOSIS — I160 Hypertensive urgency: Secondary | ICD-10-CM

## 2023-04-02 LAB — BASIC METABOLIC PANEL
Anion gap: 10 (ref 5–15)
BUN: 13 mg/dL (ref 6–20)
CO2: 19 mmol/L — ABNORMAL LOW (ref 22–32)
Calcium: 8.7 mg/dL — ABNORMAL LOW (ref 8.9–10.3)
Chloride: 111 mmol/L (ref 98–111)
Creatinine, Ser: 0.61 mg/dL (ref 0.44–1.00)
GFR, Estimated: >60 mL/min (ref >60)
Glucose, Bld: 96 mg/dL (ref 70–99)
Potassium: 3.3 mmol/L — ABNORMAL LOW (ref 3.5–5.1)
Sodium: 140 mmol/L (ref 135–145)

## 2023-04-02 LAB — CBC
HCT: 44.6 % (ref 36.0–46.0)
Hemoglobin: 14.7 g/dL (ref 12.0–15.0)
MCH: 29.9 pg (ref 26.0–34.0)
MCHC: 33 g/dL (ref 30.0–36.0)
MCV: 90.7 fL (ref 80.0–100.0)
Platelets: 302 10*3/uL (ref 150–400)
RBC: 4.92 MIL/uL (ref 3.87–5.11)
RDW: 13.1 % (ref 11.5–15.5)
WBC: 9.2 10*3/uL (ref 4.0–10.5)
nRBC: 0 % (ref 0.0–0.2)

## 2023-04-02 LAB — SEDIMENTATION RATE: Sed Rate: 4 mm/h (ref 0–20)

## 2023-04-02 MED ORDER — PROPRANOLOL HCL 20 MG PO TABS
40.0000 mg | ORAL_TABLET | Freq: Three times a day (TID) | ORAL | Status: DC
Start: 2023-04-02 — End: 2023-04-02

## 2023-04-02 NOTE — Assessment & Plan Note (Addendum)
-   The patient has new onset hyperthyroidism. - The patient will be admitted to an observation progressive unit bed. - We will continue her on p.o. propranolol  and PTU. - Will check free T3, thyroglobulin antibody, thyroid  peroxidase antibody, TSI, and sed rate. - We will obtain a thyroid  ultrasound to start with given her thyromegaly.  She may need to be thyroid  scan and radioactive iodine uptake which can be done on outpatient basis. - She will need an outpatient endocrinology appointment upon discharge.

## 2023-04-02 NOTE — Assessment & Plan Note (Signed)
-   We will continue antihypertensive therapy and place her on as needed IV labetalol .

## 2023-04-02 NOTE — ED Notes (Signed)
 Dr. Dezii at bedside to speak with patient.

## 2023-04-02 NOTE — Assessment & Plan Note (Signed)
 Will continue Topamax  and Maxalt.

## 2023-04-02 NOTE — ED Notes (Signed)
 Pt removed monitoring cords at this time. Pt informed it is unsafe for her to be discharged and that provider was notified and stated she would speak to pt on rounding. Pt verbalizes understanding.

## 2023-04-02 NOTE — ED Notes (Signed)
 Pt states she does not want to wait for admission room in hallway anymore and wants to talk to MD about being discharged. No rooms available per ED charge RN. DO, Dezii notified- awaiting response.

## 2023-04-02 NOTE — ED Notes (Signed)
 Privacy curtains placed around hallway bed for pt comfort/privacy.

## 2023-04-02 NOTE — ED Notes (Signed)
 Patient left AMA after discussing plan of care and risks of leaving with Dr. Marquette Sites. AMA form signed. Patient ambulatory from ED in NAD.

## 2023-04-02 NOTE — Assessment & Plan Note (Signed)
 -  We will continue statin therapy.

## 2023-04-02 NOTE — Progress Notes (Signed)
   04/02/23 0930  Spiritual Encounters  Type of Visit Initial  Care provided to: Patient  Referral source Chaplain assessment  Reason for visit Routine spiritual support  OnCall Visit No  Spiritual Framework  Presenting Themes Significant life change  Patient Stress Factors Lack of knowledge (Patient is anxious to know what is happening and how it affects her)  Interventions  Spiritual Care Interventions Made Established relationship of care and support;Compassionate presence;Reflective listening;Prayer;Encouragement  Intervention Outcomes  Outcomes Connection to spiritual care;Awareness around self/spiritual resourses  Spiritual Care Plan  Spiritual Care Issues Still Outstanding No further spiritual care needs at this time (see row info)

## 2023-04-02 NOTE — ED Notes (Signed)
 Patient at RN station to inquire when her doctor will be around to talk to her as she asked at 0900 to speak to someone. Patient walking around hallway and refusing to stay hooked to cardiac monitor or IV fluids despite education on importance of both.  Patient also stating she will not be staying past 3pm today.

## 2023-04-02 NOTE — Discharge Summary (Signed)
 Physician Discharge Summary   Patient: Theresa Carpenter MRN: [id number] DOB: 10/21/1980  Admit date:     04/01/2023  Discharge date: 04/02/23  LEFT AMA  Discharge Physician: Lorane Poland   PCP: Geralene Levorn ORN, NP   Recommendations at discharge:    Close outpatient follow up with endocrinology.  Discharge Diagnoses: Principal Problem:   Thyrotoxicosis Active Problems:   Hypertensive urgency   Dyslipidemia   Migraine  Resolved Problems:   * No resolved hospital problems. [facility] Course: Theresa Carpenter is a 43 year old female with hypertension, cyclic vomiting syndrome, who presented to the ED with acute onset of palpitations, headache, tremors.  She also endorsed insomnia, bilateral upper extremity paresthesias.  In the ED her blood pressure was found to be elevated and she was tachycardic to 139.  TSH was less than 0.01 and free T4 elevated at 2.42.  EKG revealed sinus tach.  Patient was admitted for treatment of thyrotoxicosis.  She was initiated on propranolol  and PTU.  On my evaluation 2/7 patient endorsed frustrations with being placed at a hallway bed in the ER.  We discussed that she is pending a hospital room but she endorsed further frustrations.  She reported  all you are doing for me is giving me medication every 2 hours, taking my vitals every hour, and making changes to medications.  Discussed with her that this is appropriate management and that we will need to continue to titrate her medications based on her vitals and lab responses.  Patient reported she would like to leave the hospital.  I discussed with her that leaving the hospital before her medications have been appropriately titrated puts her at increased risk of worsening thyrotoxicosis including thyroid  storm and even death.  She endorses understanding, she demonstrated adequate decision-making capacity and signed out AGAINST MEDICAL ADVICE.    Consultants: n/a Procedures performed: n/a Disposition: Patient  is unstable at time of discharge.  She signed out medical advice.   DISCHARGE MEDICATION:   Discharge Exam: Filed Weights   04/01/23 1558  Weight: 94.3 kg   Constitutional:  Normal appearance. Non toxic-appearing.  HENT: Head Normocephalic and atraumatic.  Mucous membranes are moist.  Eyes:  Extraocular intact. Conjunctivae normal. Pupils are equal, round, and reactive to light.  Pulmonary: Non labored, symmetric rise of chest wall.  Musculoskeletal:  Normal range of motion.  Skin: warm and dry. not jaundiced.  Neurological: No focal deficit present. alert. Oriented. Psychiatric: Mood and Affect congruent. Agitated.   Condition at discharge: poor  The results of significant diagnostics from this hospitalization (including imaging, microbiology, ancillary and laboratory) are listed below for reference.   Imaging Studies: US  THYROID  Result Date: 04/02/2023 CLINICAL DATA:  Hyperthyroid. EXAM: THYROID  ULTRASOUND TECHNIQUE: Ultrasound examination of the thyroid  gland and adjacent soft tissues was performed. COMPARISON:  None Available. FINDINGS: Parenchymal Echotexture: Mildly heterogenous Isthmus: 0.3 cm Right lobe: 4.7 x 1.2 x 2.1 cm Left lobe: 4.3 x 1.9 x 1.9 cm _________________________________________________________ Estimated total number of nodules >/= 1 cm: 0 Number of spongiform nodules >/=  2 cm not described below (TR1): 0 Number of mixed cystic and solid nodules >/= 1.5 cm not described below (TR2): 0 _________________________________________________________ No discrete nodules are seen within the thyroid  gland. No enlarged or abnormal appearing lymph nodes are identified. IMPRESSION: Mildly heterogeneous thyroid  gland without discrete nodules. Electronically Signed   By: Marcey Moan M.D.   On: 04/02/2023 08:10   CT Angio Head Neck W WO CM Result Date: 04/01/2023 CLINICAL  DATA:  High blood pressure and tachycardia, numbness in both arms, frequent headaches EXAM: CT ANGIOGRAPHY  HEAD AND NECK WITH AND WITHOUT CONTRAST TECHNIQUE: Multidetector CT imaging of the head and neck was performed using the standard protocol during bolus administration of intravenous contrast. Multiplanar CT image reconstructions and MIPs were obtained to evaluate the vascular anatomy. Carotid stenosis measurements (when applicable) are obtained utilizing NASCET criteria, using the distal internal carotid diameter as the denominator. RADIATION DOSE REDUCTION: This exam was performed according to the departmental dose-optimization program which includes automated exposure control, adjustment of the mA and/or kV according to patient size and/or use of iterative reconstruction technique. CONTRAST:  75mL OMNIPAQUE  IOHEXOL  350 MG/ML SOLN COMPARISON:  05/31/2007 CT head, no prior CTA FINDINGS: CT HEAD FINDINGS Brain: No evidence of acute infarct, hemorrhage, mass, mass effect, or midline shift. No hydrocephalus or extra-axial fluid collection. Pituitary and craniocervical junction within normal limits. Vascular: No hyperdense vessel. Skull: Negative for fracture or focal lesion. Sinuses/Orbits: No acute finding. Other: The mastoid air cells are well aerated. CTA NECK FINDINGS Aortic arch: Two-vessel arch with a common origin of the brachiocephalic and left common carotid arteries. Imaged portion shows no evidence of aneurysm or dissection. No significant stenosis of the major arch vessel origins. Right carotid system: No evidence of stenosis, dissection, or occlusion. Left carotid system: No evidence of stenosis, dissection, or occlusion. Vertebral arteries: No evidence of stenosis, dissection, or occlusion. Skeleton: No acute osseous abnormality. Other neck: No acute finding. Upper chest: No focal pulmonary opacity or pleural effusion. Review of the MIP images confirms the above findings CTA HEAD FINDINGS Anterior circulation: Both internal carotid arteries are patent to the termini, without significant stenosis. A1  segments patent. Normal anterior communicating artery. Anterior cerebral arteries are patent to their distal aspects without significant stenosis. No M1 stenosis or occlusion. MCA branches perfused to their distal aspects without significant stenosis. Posterior circulation: Vertebral arteries patent to the vertebrobasilar junction without significant stenosis. The left PICA is patent. The right PICA is not well seen. Basilar patent to its distal aspect without significant stenosis. Superior cerebellar arteries patent proximally. Patent P1 segments. PCAs perfused to their distal aspects without significant stenosis. The bilateral posterior communicating arteries are diminutive but patent. Venous sinuses: As permitted by contrast timing, patent. Anatomic variants: None significant. No evidence of aneurysm or vascular malformation. Review of the MIP images confirms the above findings IMPRESSION: 1. No acute intracranial process. 2. No intracranial large vessel occlusion or significant stenosis. No evidence of aneurysm or vascular malformation. 3. No hemodynamically significant stenosis in the neck. Electronically Signed   By: Donald Campion M.D.   On: 04/01/2023 18:58   DG Chest 2 View Result Date: 04/01/2023 CLINICAL DATA:  43 year old female with tachycardia, hypertension. EXAM: CHEST - 2 VIEW COMPARISON:  Chest radiograph 02/12/2018 and earlier. FINDINGS: PA and lateral views 1105 hours. Low lung volumes. Cardiac and mediastinal contours remain normal. Visualized tracheal air column is within normal limits. Curvilinear bilateral perihilar opacity most resembling atelectasis. No pneumothorax, pulmonary edema, pleural effusion or other confluent opacity. Stable cholecystectomy clips. No acute osseous abnormality identified. Negative visible bowel gas. IMPRESSION: Low lung volumes with perihilar atelectasis. No other cardiopulmonary abnormality. Electronically Signed   By: VEAR Hurst M.D.   On: 04/01/2023 11:34     Microbiology: Results for orders placed or performed during the hospital encounter of 08/27/22  Aerobic/Anaerobic Culture w Gram Stain (surgical/deep wound)     Status: None   Collection Time: 08/27/22  7:05 PM   Specimen: Path fluid; Body Fluid  Result Value Ref Range Status   Specimen Description   Final    ABSCESS Performed at [facility], [address], [place], [place] [address]    Special Requests   Final    NONE Performed at [facility], [address] [place], [place] [address]    Gram Stain   Final    MODERATE WBC PRESENT, PREDOMINANTLY PMN MODERATE GRAM NEGATIVE RODS MODERATE GRAM POSITIVE RODS RARE GRAM POSITIVE COCCI    Culture   Final    MODERATE ESCHERICHIA COLI MODERATE BACTEROIDES FRAGILIS BETA LACTAMASE POSITIVE WITHIN MIXED ORGANISMS Performed at [facility], [address] [place], [place] [address]    Report Status 08/31/2022 FINAL  Final   Organism ID, Bacteria ESCHERICHIA COLI  Final      Susceptibility   Escherichia coli - MIC*    AMPICILLIN 4 SENSITIVE Sensitive     CEFEPIME <=0.12 SENSITIVE Sensitive     CEFTAZIDIME <=1 SENSITIVE Sensitive     CEFTRIAXONE <=0.25 SENSITIVE Sensitive     CIPROFLOXACIN  <=0.25 SENSITIVE Sensitive     GENTAMICIN <=1 SENSITIVE Sensitive     IMIPENEM <=0.25 SENSITIVE Sensitive     TRIMETH /SULFA  <=20 SENSITIVE Sensitive     AMPICILLIN/SULBACTAM <=2 SENSITIVE Sensitive     PIP/TAZO <=4 SENSITIVE Sensitive     * MODERATE ESCHERICHIA COLI    Labs: CBC: Recent Labs  Lab 04/01/23 1051 04/02/23 0449  WBC 6.3 9.2  HGB 15.2* 14.7  HCT 44.9 44.6  MCV 88.7 90.7  PLT 300 302   Basic Metabolic Panel: Recent Labs  Lab 04/01/23 1051 04/02/23 0449  NA 140 140  K 3.8 3.3*  CL 111 111  CO2 20* 19*  GLUCOSE 108* 96  BUN 14 13  CREATININE 0.57 0.61  CALCIUM  9.4 8.7*  MG 1.8  --    Liver Function Tests: Recent Labs  Lab 04/01/23 1051  AST 36  ALT 49*  ALKPHOS 67  BILITOT 0.6   PROT 7.2  ALBUMIN 3.9   CBG: No results for input(s): GLUCAP in the last 168 hours.  Discharge time spent: greater than 30 minutes.  Signed: Anabella Capshaw, DO [facility] 04/02/2023

## 2023-04-02 NOTE — ED Notes (Signed)
Pt taken to ultrasound at this time.

## 2023-04-03 LAB — T3, FREE: T3, Free: 7.2 pg/mL — ABNORMAL HIGH (ref 2.0–4.4)

## 2023-04-03 LAB — THYROID STIMULATING IMMUNOGLOBULIN: Thyroid Stimulating Immunoglob: 6.64 [IU]/L — ABNORMAL HIGH (ref 0.00–0.55)

## 2023-04-03 LAB — THYROID PEROXIDASE ANTIBODY: Thyroperoxidase Ab SerPl-aCnc: 11 [IU]/mL (ref 0–34)

## 2023-04-04 LAB — THYROGLOBULIN ANTIBODY: Thyroglobulin Antibody: <1 [IU]/mL (ref 0.0–0.9)

## 2023-04-05 ENCOUNTER — Emergency Department: Payer: Medicaid Other

## 2023-04-05 ENCOUNTER — Other Ambulatory Visit: Payer: Self-pay

## 2023-04-05 ENCOUNTER — Inpatient Hospital Stay
Admission: EM | Admit: 2023-04-05 | Discharge: 2023-04-08 | DRG: 644 | Disposition: A | Discharge status: Home / Self Care | Payer: Medicaid Other | Attending: Student | Admitting: Student

## 2023-04-05 DIAGNOSIS — I10 Essential (primary) hypertension: Secondary | ICD-10-CM | POA: Diagnosis present

## 2023-04-05 DIAGNOSIS — E0501 Thyrotoxicosis with diffuse goiter with thyrotoxic crisis or storm: Principal | ICD-10-CM | POA: Diagnosis present

## 2023-04-05 DIAGNOSIS — E785 Hyperlipidemia, unspecified: Secondary | ICD-10-CM | POA: Diagnosis present

## 2023-04-05 DIAGNOSIS — E66813 Obesity, class 3: Secondary | ICD-10-CM | POA: Diagnosis present

## 2023-04-05 DIAGNOSIS — G43909 Migraine, unspecified, not intractable, without status migrainosus: Secondary | ICD-10-CM | POA: Diagnosis present

## 2023-04-05 DIAGNOSIS — R000 Tachycardia, unspecified: Secondary | ICD-10-CM | POA: Diagnosis present

## 2023-04-05 DIAGNOSIS — Z79899 Other long term (current) drug therapy: Secondary | ICD-10-CM

## 2023-04-05 DIAGNOSIS — E0500 Thyrotoxicosis with diffuse goiter without thyrotoxic crisis or storm: Secondary | ICD-10-CM

## 2023-04-05 DIAGNOSIS — F17210 Nicotine dependence, cigarettes, uncomplicated: Secondary | ICD-10-CM | POA: Diagnosis present

## 2023-04-05 DIAGNOSIS — E0590 Thyrotoxicosis, unspecified without thyrotoxic crisis or storm: Secondary | ICD-10-CM | POA: Diagnosis not present

## 2023-04-05 DIAGNOSIS — F419 Anxiety disorder, unspecified: Secondary | ICD-10-CM | POA: Diagnosis present

## 2023-04-05 DIAGNOSIS — F32A Depression, unspecified: Secondary | ICD-10-CM | POA: Diagnosis present

## 2023-04-05 DIAGNOSIS — Z6841 Body Mass Index (BMI) 40.0 and over, adult: Secondary | ICD-10-CM

## 2023-04-05 DIAGNOSIS — E0591 Thyrotoxicosis, unspecified with thyrotoxic crisis or storm: Principal | ICD-10-CM

## 2023-04-05 DIAGNOSIS — F410 Panic disorder [episodic paroxysmal anxiety] without agoraphobia: Secondary | ICD-10-CM | POA: Diagnosis present

## 2023-04-05 LAB — BASIC METABOLIC PANEL
Anion gap: 13 (ref 5–15)
BUN: 17 mg/dL (ref 6–20)
CO2: 20 mmol/L — ABNORMAL LOW (ref 22–32)
Calcium: 9.3 mg/dL (ref 8.9–10.3)
Chloride: 109 mmol/L (ref 98–111)
Creatinine, Ser: 0.73 mg/dL (ref 0.44–1.00)
GFR, Estimated: >60 mL/min (ref >60)
Glucose, Bld: 124 mg/dL — ABNORMAL HIGH (ref 70–99)
Potassium: 3.3 mmol/L — ABNORMAL LOW (ref 3.5–5.1)
Sodium: 142 mmol/L (ref 135–145)

## 2023-04-05 LAB — TSH: TSH: <0.01 u[IU]/mL — ABNORMAL LOW (ref 0.350–4.500)

## 2023-04-05 LAB — CBC
HCT: 44.3 % (ref 36.0–46.0)
Hemoglobin: 14.7 g/dL (ref 12.0–15.0)
MCH: 29.7 pg (ref 26.0–34.0)
MCHC: 33.2 g/dL (ref 30.0–36.0)
MCV: 89.5 fL (ref 80.0–100.0)
Platelets: 294 10*3/uL (ref 150–400)
RBC: 4.95 MIL/uL (ref 3.87–5.11)
RDW: 13.1 % (ref 11.5–15.5)
WBC: 7 10*3/uL (ref 4.0–10.5)
nRBC: 0 % (ref 0.0–0.2)

## 2023-04-05 LAB — T4, FREE: Free T4: 3.09 ng/dL — ABNORMAL HIGH (ref 0.61–1.12)

## 2023-04-05 LAB — TROPONIN I (HIGH SENSITIVITY): Troponin I (High Sensitivity): 10 ng/L (ref <18)

## 2023-04-05 MED ORDER — ONDANSETRON HCL 4 MG/2ML IJ SOLN: 4.0000 mg | Freq: Four times a day (QID) | INTRAMUSCULAR | Status: DC | PRN

## 2023-04-05 MED ORDER — PROPYLTHIOURACIL 50 MG PO TABS
500.0000 mg | ORAL_TABLET | Freq: Once | ORAL | Status: AC
  Administered 2023-04-05: 500 mg via ORAL
  Filled 2023-04-05: qty 10

## 2023-04-05 MED ORDER — ENOXAPARIN SODIUM 60 MG/0.6ML IJ SOSY
50.0000 mg | PREFILLED_SYRINGE | INTRAMUSCULAR | Status: DC
  Administered 2023-04-05 – 2023-04-07 (×3): 50 mg via SUBCUTANEOUS
  Filled 2023-04-05 (×3): qty 0.6

## 2023-04-05 MED ORDER — HYDROXYZINE HCL 25 MG PO TABS: 25.0000 mg | ORAL_TABLET | Freq: Three times a day (TID) | ORAL | Status: DC | PRN

## 2023-04-05 MED ORDER — ONDANSETRON HCL 4 MG PO TABS: 4.0000 mg | ORAL_TABLET | Freq: Four times a day (QID) | ORAL | Status: DC | PRN

## 2023-04-05 MED ORDER — ACETAMINOPHEN 325 MG PO TABS
650.0000 mg | ORAL_TABLET | Freq: Four times a day (QID) | ORAL | Status: DC | PRN
  Administered 2023-04-07: 650 mg via ORAL
  Filled 2023-04-05: qty 2

## 2023-04-05 MED ORDER — PROPRANOLOL HCL 20 MG PO TABS
80.0000 mg | ORAL_TABLET | Freq: Once | ORAL | Status: AC
  Administered 2023-04-05: 80 mg via ORAL
  Filled 2023-04-05: qty 4

## 2023-04-05 MED ORDER — ACETAMINOPHEN 650 MG RE SUPP: 650.0000 mg | Freq: Four times a day (QID) | RECTAL | Status: DC | PRN

## 2023-04-05 MED ORDER — POLYETHYLENE GLYCOL 3350 17 G PO PACK: 17.0000 g | PACK | Freq: Every day | ORAL | Status: DC | PRN

## 2023-04-05 MED ORDER — ATENOLOL 25 MG PO TABS
50.0000 mg | ORAL_TABLET | Freq: Every day | ORAL | Status: DC
  Administered 2023-04-06 – 2023-04-08 (×3): 50 mg via ORAL
  Filled 2023-04-05 (×3): qty 2

## 2023-04-05 MED ORDER — POTASSIUM CHLORIDE CRYS ER 20 MEQ PO TBCR
40.0000 meq | EXTENDED_RELEASE_TABLET | Freq: Once | ORAL | Status: AC
  Administered 2023-04-05: 40 meq via ORAL
  Filled 2023-04-05: qty 2

## 2023-04-05 MED ORDER — TOPIRAMATE 25 MG PO TABS
25.0000 mg | ORAL_TABLET | Freq: Two times a day (BID) | ORAL | Status: DC
Start: 2023-04-05 — End: 2023-04-08
  Administered 2023-04-05 – 2023-04-08 (×6): 25 mg via ORAL
  Filled 2023-04-05 (×6): qty 1

## 2023-04-05 MED ORDER — SODIUM CHLORIDE 0.9% FLUSH
3.0000 mL | Freq: Two times a day (BID) | INTRAVENOUS | Status: DC
  Administered 2023-04-05 – 2023-04-08 (×6): 3 mL via INTRAVENOUS

## 2023-04-05 MED ORDER — METHIMAZOLE 10 MG PO TABS
20.0000 mg | ORAL_TABLET | Freq: Every day | ORAL | Status: DC
  Administered 2023-04-06 – 2023-04-08 (×3): 20 mg via ORAL
  Filled 2023-04-05 (×3): qty 2

## 2023-04-05 MED ORDER — ENOXAPARIN SODIUM 40 MG/0.4ML IJ SOSY: 40.0000 mg | PREFILLED_SYRINGE | INTRAMUSCULAR | Status: DC

## 2023-04-05 NOTE — Assessment & Plan Note (Signed)
 Previously treated with metoprolol .  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  - Atenolol  as noted above

## 2023-04-05 NOTE — Assessment & Plan Note (Signed)
 Continue home topiramate.

## 2023-04-05 NOTE — ED Notes (Signed)
 ED TO INPATIENT HANDOFF REPORT  ED Nurse Name and Phone #: Concha Deed, RN  S Name/Age/Gender Theresa Carpenter 43 y.o. female Room/Bed: ED32A/ED32A  Code Status   Code Status: Full Code  Home/SNF/Other Home Patient oriented to: self, place, time, and situation Is this baseline? Yes   Triage Complete: Triage complete  Chief Complaint Thyrotoxicosis [E05.90]  Triage Note Pt here with tachycardia. Pt states she was here recently for same and was told that her thyroid  was abnormal. Pt denies cp or sob. Pt endorses vomiting yesterday morning. Pt states she is compliant with medications. Pt ambulatory to triage room.   Allergies No Known Allergies  Level of Care/Admitting Diagnosis ED Disposition     ED Disposition  Admit   Condition  --   Comment  Hospital Area: [facility] [[id number]]  Level of Care: Telemetry Medical [104]  Covid Evaluation: Asymptomatic - no recent exposure (last 10 days) testing not required  Diagnosis: Thyrotoxicosis [242251]  Admitting Physician: Avi Body [[id number]]  Attending Physician: Avi Body [[id number]]          B Medical/Surgery History Past Medical History:  Diagnosis Date   Cyclic vomiting syndrome    Family history of adverse reaction to anesthesia    BROTHER-N/V   Hypertension    Tachycardia    Past Surgical History:  Procedure Laterality Date   CHOLECYSTECTOMY     HEMORRHOID SURGERY N/A 01/27/2017   Procedure: HEMORRHOIDECTOMY;  Surgeon: Eldred Grego, MD;  Location: [facility] ORS;  Service: General;  Laterality: N/A;   INCISION AND DRAINAGE PERIRECTAL ABSCESS N/A 08/27/2022   Procedure: IRRIGATION AND DEBRIDEMENT PERIRECTAL ABSCESS;  Surgeon: Conrado Delay, DO;  Location: [facility] ORS;  Service: General;  Laterality: N/A;   RECTAL EXAM UNDER ANESTHESIA N/A 01/27/2017   Procedure: RECTAL EXAM UNDER ANESTHESIA;  Surgeon: Eldred Grego, MD;  Location: [facility] ORS;  Service: General;  Laterality: N/A;      A IV Location/Drains/Wounds Patient Lines/Drains/Airways Status     Active Line/Drains/Airways     Name Placement date Placement time Site Days   Peripheral IV 04/01/23 20 G Right Antecubital 04/01/23  --  Antecubital  4   Peripheral IV 04/01/23 20 G Left Antecubital 04/01/23  1844  Antecubital  4   Peripheral IV 04/05/23 20 G 1.88" Anterior;Right Forearm 04/05/23  1629  Forearm  less than 1   Open Drain 1 Rectal 0.3 Fr. 08/27/22  --  Rectal  221            Intake/Output Last 24 hours No intake or output data in the 24 hours ending 04/05/23 2307  Labs/Imaging Results for orders placed or performed during the hospital encounter of 04/05/23 (from the past 48 hours)  Basic metabolic panel     Status: Abnormal   Collection Time: 04/05/23  4:25 PM  Result Value Ref Range   Sodium 142 135 - 145 mmol/L   Potassium 3.3 (L) 3.5 - 5.1 mmol/L   Chloride 109 98 - 111 mmol/L   CO2 20 (L) 22 - 32 mmol/L   Glucose, Bld 124 (H) 70 - 99 mg/dL    Comment: Glucose reference range applies only to samples taken after fasting for at least 8 hours.   BUN 17 6 - 20 mg/dL   Creatinine, Ser 2.70 0.44 - 1.00 mg/dL   Calcium  9.3 8.9 - 10.3 mg/dL   GFR, Estimated >35 >00 mL/min    Comment: (NOTE) Calculated using the CKD-EPI Creatinine Equation (2021)    Anion gap  13 5 - 15    Comment: Performed at [facility], [address] [place], [place] [address]  CBC     Status: None   Collection Time: 04/05/23  4:25 PM  Result Value Ref Range   WBC 7.0 4.0 - 10.5 K/uL   RBC 4.95 3.87 - 5.11 MIL/uL   Hemoglobin 14.7 12.0 - 15.0 g/dL   HCT 60.4 54.0 - 98.1 %   MCV 89.5 80.0 - 100.0 fL   MCH 29.7 26.0 - 34.0 pg   MCHC 33.2 30.0 - 36.0 g/dL   RDW 19.1 47.8 - 29.5 %   Platelets 294 150 - 400 K/uL   nRBC 0.0 0.0 - 0.2 %    Comment: Performed at [facility], [address] [place], [place] [address]  Troponin I (High Sensitivity)     Status: None   Collection Time: 04/05/23   4:25 PM  Result Value Ref Range   Troponin I (High Sensitivity) 10 <18 ng/L    Comment: (NOTE) Elevated high sensitivity troponin I (hsTnI) values and significant  changes across serial measurements may suggest ACS but many other  chronic and acute conditions are known to elevate hsTnI results.  Refer to the "Links" section for chest pain algorithms and additional  guidance. Performed at [facility], [address] [place], [place] [address]   TSH     Status: Abnormal   Collection Time: 04/05/23  4:26 PM  Result Value Ref Range   TSH <0.010 (L) 0.350 - 4.500 uIU/mL    Comment: Performed by a 3rd Generation assay with a functional sensitivity of <=0.01 uIU/mL. Performed at [facility], [address] [place], [place] [address]   T4, free     Status: Abnormal   Collection Time: 04/05/23  4:26 PM  Result Value Ref Range   Free T4 3.09 (H) 0.61 - 1.12 ng/dL    Comment: (NOTE) Biotin ingestion may interfere with free T4 tests. If the results are inconsistent with the TSH level, previous test results, or the clinical presentation, then consider biotin interference. If needed, order repeat testing after stopping biotin. Performed at [facility], [address], [place], [place] [address]    DG Chest Bennett 1 View Result Date: 04/05/2023 CLINICAL DATA:  Tachycardia. EXAM: PORTABLE CHEST 1 VIEW COMPARISON:  04/01/2023 FINDINGS: The cardiomediastinal contours are normal. The lungs are clear. Pulmonary vasculature is normal. No consolidation, pleural effusion, or pneumothorax. No acute osseous abnormalities are seen. IMPRESSION: No active disease. Electronically Signed   By: Chadwick Colonel M.D.   On: 04/05/2023 17:05    Pending Labs Unresulted Labs (From admission, onward)     Start     Ordered   04/06/23 0500  Basic metabolic panel  Tomorrow morning,   R        04/05/23 1937   04/05/23 1700  T3, free  Once,   R        04/05/23 1700             Vitals/Pain Today's Vitals   04/05/23 2000 04/05/23 2119 04/05/23 2130 04/05/23 2200  BP: (!) 151/104  (!) 148/110 (!) 157/94  Pulse: (!) 102  98 100  Resp: 19  (!) 24 (!) 25  Temp:  (!) 97.5 F (36.4 C)    TempSrc:  Oral    SpO2: 97%  100% 99%  Weight:      Height:      PainSc:  0-No pain  Isolation Precautions No active isolations  Medications Medications  sodium chloride  flush (NS) 0.9 % injection 3 mL (3 mLs Intravenous Given 04/05/23 2119)  acetaminophen  (TYLENOL ) tablet 650 mg (has no administration in time range)    Or  acetaminophen  (TYLENOL ) suppository 650 mg (has no administration in time range)  polyethylene glycol (MIRALAX  / GLYCOLAX ) packet 17 g (has no administration in time range)  ondansetron  (ZOFRAN ) tablet 4 mg (has no administration in time range)    Or  ondansetron  (ZOFRAN ) injection 4 mg (has no administration in time range)  enoxaparin  (LOVENOX ) injection 50 mg (50 mg Subcutaneous Given 04/05/23 2006)  methimazole  (TAPAZOLE ) tablet 20 mg (has no administration in time range)  atenolol  (TENORMIN ) tablet 50 mg (has no administration in time range)  hydrOXYzine  (ATARAX ) tablet 25 mg (has no administration in time range)  topiramate  (TOPAMAX ) tablet 25 mg (25 mg Oral Given 04/05/23 2228)  propranolol  (INDERAL ) tablet 80 mg (80 mg Oral Given 04/05/23 1651)  propylthiouracil  (PTU) tablet 500 mg (500 mg Oral Given 04/05/23 1714)  potassium chloride  SA (KLOR-CON  M) CR tablet 40 mEq (40 mEq Oral Given 04/05/23 2004)    Mobility walks     Focused Assessments Cardiac Assessment Handoff:  Cardiac Rhythm: Sinus tachycardia Lab Results  Component Value Date   TROPONINI < 0.02 01/16/2014   No results found for: "DDIMER" Does the Patient currently have chest pain? No    R Recommendations: See Admitting Provider Note  Report given to:   Additional Notes: Pt is A&O x4, Being admitted for Throtoxicosis

## 2023-04-05 NOTE — Assessment & Plan Note (Signed)
 In the setting of thyrotoxicosis.  - Beta-blocker as noted above - Telemetry monitoring

## 2023-04-05 NOTE — Assessment & Plan Note (Signed)
 Atarax as needed for anxiety

## 2023-04-05 NOTE — Assessment & Plan Note (Signed)
 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    - S/p Propanolol 80 mg once in the ED. Start Atenolol  50 mg daily tomorrow AM - S/p PTU 500 mg once in the ED.  Start methimazole  20 mg daily tomorrow AM - Telemetry monitoring - Will need outpatient referral to endocrinology

## 2023-04-05 NOTE — ED Provider Notes (Signed)
 [facility] Provider Note    Event Date/Time   First MD Initiated Contact with Patient 04/05/23 1610     (approximate)   History   Tachycardia   HPI  Theresa Carpenter is a 43 y.o. female with a history of hypertension, cyclical vomiting, and thyrotoxicosis who presents with palpitations, dizziness, and high blood pressure over the last several days after leaving the hospital.  The patient states that she was admitted last week after presenting with the same symptoms and told that her thyroid  was out of control.  However she states that the next morning she had a "panic attack" and decided to leave the hospital.  She was not prescribed any medications.  She returns with the same symptoms.  She denies any chest pain.  She has no swelling in her legs.  She has no vomiting or diarrhea.  I reviewed the past medical records.  I confirmed that the patient was just admitted to the hospitalist service on 2/6 after being diagnosed with thyrotoxicosis.  She left AMA the day after admission.   Physical Exam   Triage Vital Signs: ED Triage Vitals  Encounter Vitals Group     BP 04/05/23 1603 (!) 168/110     Systolic BP Percentile --      Diastolic BP Percentile --      Pulse Rate 04/05/23 1603 (!) 166     Resp 04/05/23 1603 (!) 21     Temp 04/05/23 1603 98.6 F (37 C)     Temp Source 04/05/23 1603 Oral     SpO2 04/05/23 1603 97 %     Weight 04/05/23 1555 207 lb 14.3 oz (94.3 kg)     Height 04/05/23 1555 5' (1.524 m)     Head Circumference --      Peak Flow --      Pain Score 04/05/23 1555 0     Pain Loc --      Pain Education --      Exclude from Growth Chart --     Most recent vital signs: Vitals:   04/05/23 2130 04/05/23 2200  BP: (!) 148/110 (!) 157/94  Pulse: 98 100  Resp: (!) 24 (!) 25  Temp:    SpO2: 100% 99%     General: Alert and oriented, no distress.  CV:  Good peripheral perfusion.  Tachycardic, regular rhythm. Resp:  Normal effort.   Abd:  No distention.  Other:  No peripheral edema.   ED Results / Procedures / Treatments   Labs (all labs ordered are listed, but only abnormal results are displayed) Labs Reviewed  BASIC METABOLIC PANEL - Abnormal; Notable for the following components:      Result Value   Potassium 3.3 (*)    CO2 20 (*)    Glucose, Bld 124 (*)    All other components within normal limits  TSH - Abnormal; Notable for the following components:   TSH <0.010 (*)    All other components within normal limits  T4, FREE - Abnormal; Notable for the following components:   Free T4 3.09 (*)    All other components within normal limits  CBC  T3, FREE  BASIC METABOLIC PANEL  POC URINE PREG, ED  TROPONIN I (HIGH SENSITIVITY)     EKG  ED ECG REPORT I, Lind Repine, the attending physician, personally viewed and interpreted this ECG.  Date: 04/05/2023 EKG Time: 1556 Rate: 168 Rhythm: Sinus tachycardia QRS Axis: normal Intervals: normal ST/T Wave  abnormalities: normal Narrative Interpretation: no evidence of acute ischemia    RADIOLOGY  Chest x-ray: I independently viewed and interpreted the images; there is no focal consolidation or edema   PROCEDURES:  Critical Care performed: Yes, see critical care procedure note(s)  .Critical Care  Performed by: Lind Repine, MD Authorized by: Lind Repine, MD   Critical care provider statement:    Critical care time (minutes):  30   Critical care time was exclusive of:  Separately billable procedures and treating other patients   Critical care was necessary to treat or prevent imminent or life-threatening deterioration of the following conditions:  Endocrine crisis   Critical care was time spent personally by me on the following activities:  Development of treatment plan with patient or surrogate, discussions with consultants, evaluation of patient's response to treatment, examination of patient, ordering and review of  laboratory studies, ordering and review of radiographic studies, ordering and performing treatments and interventions, pulse oximetry, re-evaluation of patient's condition, review of old charts and obtaining history from patient or surrogate   Care discussed with: admitting provider      MEDICATIONS ORDERED IN ED: Medications  sodium chloride  flush (NS) 0.9 % injection 3 mL (3 mLs Intravenous Given 04/05/23 2119)  acetaminophen  (TYLENOL ) tablet 650 mg (has no administration in time range)    Or  acetaminophen  (TYLENOL ) suppository 650 mg (has no administration in time range)  polyethylene glycol (MIRALAX  / GLYCOLAX ) packet 17 g (has no administration in time range)  ondansetron  (ZOFRAN ) tablet 4 mg (has no administration in time range)    Or  ondansetron  (ZOFRAN ) injection 4 mg (has no administration in time range)  enoxaparin  (LOVENOX ) injection 50 mg (50 mg Subcutaneous Given 04/05/23 2006)  methimazole  (TAPAZOLE ) tablet 20 mg (has no administration in time range)  atenolol  (TENORMIN ) tablet 50 mg (has no administration in time range)  hydrOXYzine  (ATARAX ) tablet 25 mg (has no administration in time range)  topiramate  (TOPAMAX ) tablet 25 mg (25 mg Oral Given 04/05/23 2228)  propranolol  (INDERAL ) tablet 80 mg (80 mg Oral Given 04/05/23 1651)  propylthiouracil  (PTU) tablet 500 mg (500 mg Oral Given 04/05/23 1714)  potassium chloride  SA (KLOR-CON  M) CR tablet 40 mEq (40 mEq Oral Given 04/05/23 2004)     IMPRESSION / MDM / ASSESSMENT AND PLAN / ED COURSE  I reviewed the triage vital signs and the nursing notes.  43 year old female with PMH as noted above presents with palpitations, elevated blood pressure, and dizziness after being admitted last week for thyrotoxicosis and signing out AMA.  She has not taken any medications since leaving the hospital.  Exam is as described above.  Differential diagnosis includes, but is not limited to, thyroid  storm, dehydration, cardiac dysrhythmia, viral  infection.  Patient's presentation is most consistent with acute presentation with potential threat to life or bodily function.  The patient is on the cardiac monitor to evaluate for evidence of arrhythmia and/or significant heart rate changes.  Overall presentation is most consistent with thyroid  storm.  We will obtain basic labs, thyroid  labs, and give propranolol  and PTU.  ----------------------------------------- 6:39 PM on 04/05/2023 -----------------------------------------  Lab workup is significant for low TSH and elevated T4 consistent with thyrotoxicosis.  The heart rate has markedly improved.  The patient will need admission for further management.  I consulted Dr. Guss Legacy from the hospitalist service; based on our discussion she agrees to evaluate the patient for admission.  FINAL CLINICAL IMPRESSION(S) / ED DIAGNOSES   Final diagnoses:  Thyrotoxicosis with  thyrotoxic crisis, unspecified thyrotoxicosis type     Rx / DC Orders   ED Discharge Orders     None        Note:  This document was prepared using Dragon voice recognition software and may include unintentional dictation errors.    Lind Repine, MD 04/05/23 [phone]

## 2023-04-05 NOTE — ED Provider Triage Note (Signed)
 Emergency Medicine Provider Triage Evaluation Note  Theresa Carpenter , a 43 y.o. female  was evaluated in triage.  Pt complains of tachycardia, was seen in the hospital a few days ago and told she has thyroid  problems. HR is in 170s in triage.  Review of Systems  Positive: Palpitations, light headed Negative: CP, SOB  Physical Exam  Ht 5' (1.524 m)   Wt 94.3 kg   LMP 03/28/2023 (Exact Date)   BMI 40.60 kg/m  Gen:   Awake, no distress   Resp:  Normal effort  MSK:   Moves extremities without difficulty  Other:    Medical Decision Making  Medically screening exam initiated at 3:56 PM.  Appropriate orders placed.  Martha Slack was informed that the remainder of the evaluation will be completed by another provider, this initial triage assessment does not replace that evaluation, and the importance of remaining in the ED until their evaluation is complete.    Phyliss Breen, PA-C 04/05/23 1557

## 2023-04-05 NOTE — H&P (Signed)
 History and Physical    Patient: Theresa Carpenter EAV:[id number] DOB: 1980/05/17 DOA: 04/05/2023 DOS: the patient was seen and examined on 04/05/2023 PCP: Laurence Pons, NP  Patient coming from: Home  Chief Complaint:  Chief Complaint  Patient presents with   Tachycardia   HPI: Theresa Carpenter is a 43 y.o. female with medical history significant of hypertension, hyperlipidemia, cyclic vomiting syndrome, hyperlipidemia, who presents to the ED 2/2 tachycardia.   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  She was started on propranolol  and PTU but subsequently left AMA.  Theresa Carpenter states that since she has been home, she has continued to experience palpitations and one-time episode of dizziness.  She endorses chronic unchanged nausea/vomiting that has not been problematic for her over the last few days.  She denies any chest pain, lower extremity swelling or shortness of breath.  ED course: On arrival to the ED, patient was hypertensive at 151/108 with heart rate of 127.  She was saturating at 98% on room air.  She was afebrile at 98.6.  Initial workup notable for unremarkable CBC, potassium 3.3, bicarb 20.  TSH less than 0.010 with free T43.09.  Patient was started on PTU and propranolol .  TRH contacted for admission.  Review of Systems: As mentioned in the history of present illness. All other systems reviewed and are negative.  Past Medical History:  Diagnosis Date   Cyclic vomiting syndrome    Family history of adverse reaction to anesthesia    BROTHER-N/V   Hypertension    Tachycardia    Past Surgical History:  Procedure Laterality Date   CHOLECYSTECTOMY     HEMORRHOID SURGERY N/A 01/27/2017   Procedure: HEMORRHOIDECTOMY;  Surgeon: Eldred Grego, MD;  Location: [facility] ORS;  Service: General;  Laterality: N/A;   INCISION AND DRAINAGE PERIRECTAL ABSCESS N/A 08/27/2022    Procedure: IRRIGATION AND DEBRIDEMENT PERIRECTAL ABSCESS;  Surgeon: Conrado Delay, DO;  Location: [facility] ORS;  Service: General;  Laterality: N/A;   RECTAL EXAM UNDER ANESTHESIA N/A 01/27/2017   Procedure: RECTAL EXAM UNDER ANESTHESIA;  Surgeon: Eldred Grego, MD;  Location: [facility] ORS;  Service: General;  Laterality: N/A;   Social History:  reports that she has been smoking cigarettes. She has a 3 pack-year smoking history. She has never used smokeless tobacco. She reports current alcohol use. She reports current drug use. Frequency: 2.00 times per week. Drug: Marijuana.  No Known Allergies  No family history on file.  Prior to Admission medications   Medication Sig Start Date End Date Taking? Authorizing Provider  amoxicillin -clavulanate (AUGMENTIN ) 875-125 MG tablet Take 1 tablet by mouth 2 (two) times daily. Patient not taking: Reported on 04/01/2023 11/06/22   Ruth Cove, MD  atorvastatin  (LIPITOR) 20 MG tablet Take 1 tablet by mouth daily. Patient not taking: Reported on 04/01/2023 08/17/22 08/17/23  [provider]  HYDROcodone -acetaminophen  (NORCO/VICODIN) 5-325 MG tablet Take 1 tablet by mouth every 4 (four) hours as needed. Patient not taking: Reported on 04/01/2023 11/06/22   Ruth Cove, MD  hydrOXYzine  (ATARAX /VISTARIL ) 25 MG tablet Take 25 mg by mouth 3 (three) times daily. Patient not taking: Reported on 08/27/2022    [provider]  ibuprofen  (ADVIL ) 200 MG tablet Take 200 mg by mouth every 6 (six) hours as needed for headache or mild pain (pain score 1-3).    [provider]  metoprolol  succinate (TOPROL -XL) 50 MG 24 hr  tablet Take 50 mg by mouth daily.  09/15/16  Yes [provider]  naproxen  sodium (ALEVE ) 220 MG tablet Take 220 mg by mouth.   Yes [provider]  ondansetron  (ZOFRAN  ODT) 4 MG disintegrating tablet Take 1 tablet (4 mg total) by mouth every 8 (eight) hours as needed for nausea or vomiting. Patient not  taking: Reported on 08/27/2022 02/11/18   Bryson Carbine, MD  promethazine  (PHENERGAN ) 25 MG tablet Take 25 mg by mouth every 6 (six) hours as needed for nausea. 07/30/16  Yes [provider]  rizatriptan (MAXALT) 5 MG tablet Take 5 mg by mouth as needed (vomiting syndrome). May repeat in 2 hours if needed Patient not taking: Reported on 04/01/2023    [provider]  topiramate  (TOPAMAX ) 25 MG tablet Take 25 mg by mouth 2 (two) times daily. 09/14/16  Yes [provider]    Physical Exam: Vitals:   04/05/23 1800 04/05/23 1830 04/05/23 1900 04/05/23 2000  BP: (!) 142/104 (!) 144/108 (!) 154/118 (!) 151/104  Pulse: (!) 103 99 (!) 105 (!) 102  Resp: 18 18 18 19   Temp:      TempSrc:      SpO2: 97% 98% 100% 97%  Weight:      Height:       Physical Exam Vitals and nursing note reviewed.  Constitutional:      General: She is not in acute distress.    Appearance: She is normal weight. She is not toxic-appearing.  HENT:     Head: Normocephalic and atraumatic.  Eyes:     Conjunctiva/sclera: Conjunctivae normal.     Pupils: Pupils are equal, round, and reactive to light.  Cardiovascular:     Rate and Rhythm: Regular rhythm. Tachycardia present.     Heart sounds: No murmur heard.    No gallop.  Pulmonary:     Effort: Pulmonary effort is normal. No respiratory distress.     Breath sounds: Normal breath sounds. No wheezing, rhonchi or rales.  Abdominal:     General: Bowel sounds are normal.     Palpations: Abdomen is soft.  Musculoskeletal:     Right lower leg: No edema.     Left lower leg: No edema.  Skin:    General: Skin is warm and dry.  Neurological:     General: No focal deficit present.     Mental Status: She is alert and oriented to person, place, and time. Mental status is at baseline.  Psychiatric:        Mood and Affect: Mood normal.        Behavior: Behavior normal.    Data Reviewed: CBC with WBC of 7.0, hemoglobin of 14.7, platelets of 294 BMP  with sodium of 142, potassium 3.3, bicarb 20, glucose 124, BUN 17, creatinine 0.73 and GFR above 60 Troponin 10 TSH less than 0.010 Free T43.09  Thyroglobulin antibody less than 1 Thyroid -stimulating immunoglobulin elevated at 6.64  EKG personally reviewed.  Sinus tachycardia with rate of 168.  There are no new results to review at this time.  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    - S/p Propanolol 80 mg once in the ED. Start Atenolol  50 mg daily tomorrow AM - S/p PTU 500 mg once in the ED.  Start methimazole  20 mg daily tomorrow AM - Telemetry monitoring - Will need outpatient referral to endocrinology  Essential hypertension Previously treated with metoprolol .  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  - Atenolol  as noted above  Sinus tachycardia In the setting of thyrotoxicosis.  - Beta-blocker as noted above - Telemetry monitoring  Migraine - Continue home topiramate   Anxiety and depression - Atarax  as needed for anxiety  Advance Care Planning:   Code Status: Full Code   Consults: None  Family Communication: No family at bedside.   Severity of Illness: The appropriate patient status for this patient is OBSERVATION. Observation status is judged to be reasonable and necessary in order to provide the required intensity of service to ensure the patient's safety. The patient's presenting symptoms, physical exam findings, and initial radiographic and laboratory data in the context of their medical condition is felt to place them at decreased risk for further clinical deterioration. Furthermore, it is anticipated that the patient will be medically stable for discharge from the hospital within 2 midnights of admission.   Author: Avi Body, MD 04/05/2023 8:46 PM  For on call review [email or web address].

## 2023-04-05 NOTE — Progress Notes (Signed)
 PHARMACIST - PHYSICIAN COMMUNICATION  CONCERNING:  Enoxaparin  (Lovenox ) for DVT Prophylaxis    RECOMMENDATION: Patient was prescribed enoxaprin 40mg  q24 hours for VTE prophylaxis.   Filed Weights   04/05/23 1555  Weight: 94.3 kg (207 lb 14.3 oz)    Body mass index is 40.6 kg/m.  Estimated Creatinine Clearance: 95 mL/min (by C-G formula based on SCr of 0.73 mg/dL).   Based on [facility] policy patient is candidate for enoxaparin  0.5mg /kg TBW SQ every 24 hours based on BMI being >30.  DESCRIPTION: Pharmacy has adjusted enoxaparin  dose per [facility] policy.  Patient is now receiving enoxaparin  50 mg every 24 hours    Antonisha Waskey Rodriguez-Guzman PharmD, BCPS 04/05/2023 7:40 PM

## 2023-04-05 NOTE — ED Triage Notes (Signed)
 Pt here with tachycardia. Pt states she was here recently for same and was told that her thyroid  was abnormal. Pt denies cp or sob. Pt endorses vomiting yesterday morning. Pt states she is compliant with medications. Pt ambulatory to triage room.

## 2023-04-06 ENCOUNTER — Encounter: Payer: Self-pay | Admitting: Internal Medicine

## 2023-04-06 DIAGNOSIS — I10 Essential (primary) hypertension: Secondary | ICD-10-CM | POA: Diagnosis not present

## 2023-04-06 DIAGNOSIS — F419 Anxiety disorder, unspecified: Secondary | ICD-10-CM | POA: Diagnosis not present

## 2023-04-06 DIAGNOSIS — E0590 Thyrotoxicosis, unspecified without thyrotoxic crisis or storm: Secondary | ICD-10-CM | POA: Diagnosis not present

## 2023-04-06 DIAGNOSIS — F32A Depression, unspecified: Secondary | ICD-10-CM | POA: Diagnosis not present

## 2023-04-06 LAB — BASIC METABOLIC PANEL
Anion gap: 10 (ref 5–15)
BUN: 12 mg/dL (ref 6–20)
CO2: 19 mmol/L — ABNORMAL LOW (ref 22–32)
Calcium: 8.8 mg/dL — ABNORMAL LOW (ref 8.9–10.3)
Chloride: 110 mmol/L (ref 98–111)
Creatinine, Ser: 0.61 mg/dL (ref 0.44–1.00)
GFR, Estimated: >60 mL/min (ref >60)
Glucose, Bld: 97 mg/dL (ref 70–99)
Potassium: 3.5 mmol/L (ref 3.5–5.1)
Sodium: 139 mmol/L (ref 135–145)

## 2023-04-06 MED ORDER — AMLODIPINE BESYLATE 5 MG PO TABS
5.0000 mg | ORAL_TABLET | Freq: Every day | ORAL | Status: DC
  Administered 2023-04-06: 5 mg via ORAL
  Filled 2023-04-06: qty 1

## 2023-04-06 NOTE — Progress Notes (Addendum)
1308 NT informed nurse that b/p was 168/114. Nurse rechecked b/p manually 178/120. Pt denies any visual disturbances , headache Chest pain or numbness and tingling. MD made aware placed order for 5mg  PO amlodipine to give now.   1548 Pt b/p reported by NT 156/117, rechecked manually 168/108, pulse 100. pt still continues to deny headache, chest pain or visual disturbances. MD made aware. No new orders

## 2023-04-06 NOTE — Progress Notes (Signed)
PROGRESS NOTE    Theresa Carpenter  EXB:[id number] DOB: [birth year] DOA: 04/05/2023 PCP: Noralyn Pick, NP  Chief Complaint  Patient presents with   Tachycardia    Hospital Course:  Theresa Carpenter is 43 y.o. female with history of hypertension, hyperlipidemia, cyclic vomiting syndrome, who presented to the ED with tachycardia.  Patient was recently admitted on 2/6 and was started on treatment for thyrotoxicosis but left AMA prior to completing treatment.  On arrival this admission she reports she has had continued palpitations and dizziness.  In the ED she was hypertensive to 151/108 and tachycardic to 127.  Labs are mostly unremarkable.  TSH less than 0.01 with free T4 of 3.09, unchanged from prior.  She was initiated on PTU and propranolol and admitted.  Subjective: On evaluation this morning patient reports her palpitations are improving some compared to arrival.   Objective: Vitals:   04/06/23 0112 04/06/23 0747 04/06/23 0750 04/06/23 0903  BP: (!) 161/107 (!) 168/114 (!) 178/120 (!) 158/113  Pulse: 98 96    Resp: 16 16    Temp: 98.2 F (36.8 C) 98.2 F (36.8 C)    TempSrc:  Oral    SpO2: 100% 100%    Weight:      Height:        Intake/Output Summary (Last 24 hours) at 04/06/2023 0954 Last data filed at 04/06/2023 1027 Gross per 24 hour  Intake 240 ml  Output --  Net 240 ml   Filed Weights   04/05/23 1555  Weight: 94.3 kg    Examination: General exam: Appears calm and comfortable, NAD  Respiratory system: No work of breathing, symmetric chest wall expansion Cardiovascular system: S1 & S2 heard, rapid rate. Gastrointestinal system: Abdomen is nondistended, soft and nontender.  Neuro: Alert and oriented. No focal neurological deficits. Extremities: Symmetric, expected ROM Skin: No rashes, lesions Psychiatry: Demonstrates appropriate judgement and insight. Mood & affect appropriate for situation.   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 - Continue with home dose atenolol, add amlodipine  Sinus tachycardia - In setting of thyrotoxicosis as above  Migraine - Continue home dose Topamax  Anxiety depression - Continue home meds  DVT prophylaxis: lovenox   Code Status: Full Code Family Communication:  Discussed directly with patient Disposition:  Observation still hospitalized for medication titration, will discharge to home when medically stable. Pulse improving  Consultants:    Procedures:    Antimicrobials:  Anti-infectives (From admission, onward)    None       Data Reviewed: I have personally reviewed following labs and imaging studies CBC: Recent Labs  Lab 04/01/23 1051 04/02/23 0449 04/05/23 1625  WBC 6.3 9.2 7.0  HGB 15.2* 14.7 14.7  HCT 44.9 44.6 44.3  MCV 88.7 90.7 89.5  PLT 300 302 294   Basic Metabolic Panel: Recent Labs  Lab 04/01/23 1051 04/02/23 0449 04/05/23 1625 04/06/23 0652  NA 140 140 142 139  K 3.8 3.3* 3.3* 3.5  CL 111 111 109 110  CO2 20* 19* 20* 19*  GLUCOSE 108* 96 124* 97  BUN 14 13 17 12   CREATININE 0.57 0.61 0.73 0.61  CALCIUM 9.4 8.7* 9.3 8.8*  MG 1.8  --   --   --    GFR: Estimated Creatinine Clearance: 95 mL/min (by C-G formula based on SCr of 0.61 mg/dL). Liver Function Tests: Recent Labs  Lab 04/01/23 1051  AST 36  ALT 49*  ALKPHOS 67  BILITOT 0.6  PROT 7.2  ALBUMIN 3.9    CBG: No results for input(s): "GLUCAP" in the last 168 hours.  No results found for this or any previous visit (from the past 240 hours).   Radiology Studies: DG Chest Port 1 View Result Date: 04/05/2023 CLINICAL DATA:  Tachycardia. EXAM: PORTABLE CHEST 1 VIEW COMPARISON:  04/01/2023 FINDINGS: The cardiomediastinal contours are normal. The lungs are clear. Pulmonary vasculature is normal. No consolidation, pleural effusion, or pneumothorax. No acute osseous abnormalities are seen. IMPRESSION: No active disease. Electronically Signed   By: Narda Rutherford M.D.   On: 04/05/2023 17:05    Scheduled Meds:  amLODipine  5 mg Oral Daily   atenolol  50 mg Oral Daily   enoxaparin (LOVENOX) injection  50 mg Subcutaneous Q24H   methIMAzole  20 mg Oral Daily   sodium chloride flush  3 mL Intravenous Q12H   topiramate  25 mg Oral BID   Continuous Infusions:   LOS: 0 days    Total time spent coordinating care:   Debarah Crape, DO [facility]  To contact the attending physician between 7A-7P please use Epic Chat. To contact the covering physician during after hours 7P-7A, please review Amion.   04/06/2023, 9:54 AM   *This document has been created with the assistance of dictation software. Please excuse typographical errors. *

## 2023-04-07 DIAGNOSIS — E0590 Thyrotoxicosis, unspecified without thyrotoxic crisis or storm: Secondary | ICD-10-CM | POA: Diagnosis present

## 2023-04-07 DIAGNOSIS — Z79899 Other long term (current) drug therapy: Secondary | ICD-10-CM | POA: Diagnosis not present

## 2023-04-07 DIAGNOSIS — E0501 Thyrotoxicosis with diffuse goiter with thyrotoxic crisis or storm: Secondary | ICD-10-CM | POA: Diagnosis present

## 2023-04-07 DIAGNOSIS — F32A Depression, unspecified: Secondary | ICD-10-CM | POA: Diagnosis present

## 2023-04-07 DIAGNOSIS — E66813 Obesity, class 3: Secondary | ICD-10-CM | POA: Diagnosis present

## 2023-04-07 DIAGNOSIS — I10 Essential (primary) hypertension: Secondary | ICD-10-CM | POA: Diagnosis present

## 2023-04-07 DIAGNOSIS — F17210 Nicotine dependence, cigarettes, uncomplicated: Secondary | ICD-10-CM | POA: Diagnosis present

## 2023-04-07 DIAGNOSIS — E785 Hyperlipidemia, unspecified: Secondary | ICD-10-CM | POA: Diagnosis present

## 2023-04-07 DIAGNOSIS — F410 Panic disorder [episodic paroxysmal anxiety] without agoraphobia: Secondary | ICD-10-CM | POA: Diagnosis present

## 2023-04-07 DIAGNOSIS — G43909 Migraine, unspecified, not intractable, without status migrainosus: Secondary | ICD-10-CM | POA: Diagnosis present

## 2023-04-07 DIAGNOSIS — Z6841 Body Mass Index (BMI) 40.0 and over, adult: Secondary | ICD-10-CM | POA: Diagnosis not present

## 2023-04-07 LAB — HEPATIC FUNCTION PANEL
ALT: 46 U/L — ABNORMAL HIGH (ref 0–44)
AST: 36 U/L (ref 15–41)
Albumin: 4 g/dL (ref 3.5–5.0)
Alkaline Phosphatase: 64 U/L (ref 38–126)
Bilirubin, Direct: <0.1 mg/dL (ref 0.0–0.2)
Total Bilirubin: 0.8 mg/dL (ref 0.0–1.2)
Total Protein: 7.2 g/dL (ref 6.5–8.1)

## 2023-04-07 LAB — VITAMIN D 25 HYDROXY (VIT D DEFICIENCY, FRACTURES): Vit D, 25-Hydroxy: 18.86 ng/mL — ABNORMAL LOW (ref 30–100)

## 2023-04-07 LAB — T3, FREE: T3, Free: 13.4 pg/mL — ABNORMAL HIGH (ref 2.0–4.4)

## 2023-04-07 LAB — PHOSPHORUS: Phosphorus: 2.6 mg/dL (ref 2.5–4.6)

## 2023-04-07 LAB — MAGNESIUM: Magnesium: 1.9 mg/dL (ref 1.7–2.4)

## 2023-04-07 LAB — T4, FREE: Free T4: 2.64 ng/dL — ABNORMAL HIGH (ref 0.61–1.12)

## 2023-04-07 MED ORDER — HYDRALAZINE HCL 50 MG PO TABS
50.0000 mg | ORAL_TABLET | Freq: Three times a day (TID) | ORAL | Status: DC | PRN
  Administered 2023-04-07: 50 mg via ORAL
  Filled 2023-04-07: qty 1

## 2023-04-07 MED ORDER — ATENOLOL 25 MG PO TABS: 25.0000 mg | ORAL_TABLET | Freq: Every evening | ORAL | Status: DC | PRN

## 2023-04-07 MED ORDER — ATENOLOL 25 MG PO TABS
25.0000 mg | ORAL_TABLET | Freq: Once | ORAL | Status: AC
  Administered 2023-04-07: 25 mg via ORAL
  Filled 2023-04-07: qty 1

## 2023-04-07 NOTE — Progress Notes (Signed)
PROGRESS NOTE    Theresa Carpenter  XLK:[id number] DOB: [birth year] DOA: 04/05/2023 PCP: Noralyn Pick, NP  Chief Complaint  Patient presents with   Tachycardia    Hospital Course:  Theresa Carpenter is 43 y.o. female with history of hypertension, hyperlipidemia, cyclic vomiting syndrome, who presented to the ED with tachycardia.  Patient was recently admitted on 2/6 and was started on treatment for thyrotoxicosis but left AMA prior to completing treatment.  On arrival this admission she reports she has had continued palpitations and dizziness.  In the ED she was hypertensive to 151/108 and tachycardic to 127.  Labs are mostly unremarkable.  TSH less than 0.01 with free T4 of 3.09, unchanged from prior.  She was initiated on PTU and propranolol and admitted.  Subjective:  No significant overnight events, patient denies any shortness of breath, blood pressure remained high, no palpitations or chest pain   Objective: Vitals:   04/07/23 0813 04/07/23 1019 04/07/23 1148 04/07/23 1517  BP: (!) 161/110 (!) 158/105 (!) 145/99 (!) 151/104  Pulse: (!) 103 96 91 98  Resp: 16   17  Temp: 97.6 F (36.4 C)     TempSrc:      SpO2: 100%   100%  Weight:      Height:        Intake/Output Summary (Last 24 hours) at 04/07/2023 1524 Last data filed at 04/07/2023 1056 Gross per 24 hour  Intake 240 ml  Output --  Net 240 ml   Filed Weights   04/05/23 1555  Weight: 94.3 kg    Examination: General exam: Appears calm and comfortable, NAD  Respiratory system: No work of breathing, symmetric chest wall expansion Cardiovascular system: S1 & S2 heard, rapid rate. Gastrointestinal system: Abdomen is nondistended, soft and nontender.  Neuro: Alert and oriented. No focal neurological deficits. Extremities: Symmetric, expected ROM Skin: No rashes, lesions Psychiatry: Demonstrates appropriate judgement and insight. Mood & affect appropriate for situation.   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  - Status post propranolol ED in the ED, on atenolol 50 daily now - Status post PTU 500 in the ED, on methimazole now - Continue telemetry monitoring, tachycardia is improved - Needs outpatient follow-up with endocrinology 2/12 increased atenolol 75 mg, 25 at bedtime p.o. 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 - Continue with home dose atenolol,  S/p amlodipine d/c'd on 2/12, will increase Atenolol due to tachycardia  Sinus tachycardia - In setting of thyrotoxicosis as above  Migraine - Continue home dose Topamax  Anxiety depression - Continue home meds  DVT prophylaxis: lovenox   Code Status: Full Code Family Communication:  Discussed directly with patient Disposition:  changed to inpt due to HTN and Tachy Possible dc home tomorrow   Consultants:  None  Procedures:  none  Antimicrobials:  Anti-infectives (From admission, onward)    None       Data Reviewed: I have personally reviewed following labs and imaging studies CBC: Recent Labs  Lab 04/01/23 1051 04/02/23 0449 04/05/23 1625  WBC 6.3 9.2 7.0  HGB 15.2* 14.7 14.7  HCT 44.9 44.6 44.3  MCV 88.7 90.7 89.5  PLT 300 302 294   Basic Metabolic Panel: Recent Labs  Lab 04/01/23 1051 04/02/23 0449 04/05/23 1625 04/06/23 0652 04/07/23 0858  NA 140 140 142 139  --   K 3.8 3.3* 3.3* 3.5  --   CL 111 111 109 110  --   CO2 20* 19* 20* 19*  --   GLUCOSE 108* 96 124* 97  --   BUN 14 13 17 12   --   CREATININE 0.57 0.61 0.73 0.61  --   CALCIUM 9.4  8.7* 9.3 8.8*  --   MG 1.8  --   --   --  1.9  PHOS  --   --   --   --  2.6   GFR: Estimated Creatinine Clearance: 95 mL/min (by C-G formula based on SCr of 0.61 mg/dL). Liver Function Tests: Recent Labs  Lab 04/01/23 1051 04/07/23 0858  AST 36 36  ALT 49* 46*  ALKPHOS 67 64  BILITOT 0.6 0.8  PROT 7.2 7.2  ALBUMIN 3.9 4.0   CBG: No results for input(s): "GLUCAP" in the last 168 hours.  No results found for this or any previous visit (from the past 240 hours).   Radiology Studies: DG Chest Port 1 View Result Date: 04/05/2023 CLINICAL DATA:  Tachycardia. EXAM: PORTABLE CHEST 1 VIEW COMPARISON:  04/01/2023 FINDINGS: The cardiomediastinal contours are normal. The lungs are clear. Pulmonary vasculature is normal. No consolidation, pleural effusion, or pneumothorax. No acute osseous abnormalities are seen. IMPRESSION: No active disease. Electronically Signed   By: Narda Rutherford M.D.   On: 04/05/2023 17:05    Scheduled Meds:  atenolol  50 mg Oral Daily   enoxaparin (LOVENOX) injection  50 mg Subcutaneous Q24H   methIMAzole  20 mg Oral Daily   sodium chloride flush  3 mL Intravenous Q12H   topiramate  25 mg Oral BID   Continuous Infusions:   LOS: 0 days    Total time spent coordinating care:  40 min  Gillis Santa, MD [facility]  To contact the attending physician between 7A-7P please use Epic Chat. To contact the covering physician during after hours 7P-7A, please review Amion.   04/07/2023, 3:24 PM   *This document has been created with the assistance of dictation software. Please excuse typographical errors. *

## 2023-04-07 NOTE — Plan of Care (Signed)
Problem: Health Behavior/Discharge Planning: Goal: Ability to manage health-related needs will improve Outcome: Progressing   Problem: Clinical Measurements: Goal: Will remain free from infection Outcome: Progressing Goal: Diagnostic test results will improve Outcome: Progressing   Problem: Activity: Goal: Risk for activity intolerance will decrease Outcome: Progressing   Problem: Nutrition: Goal: Adequate nutrition will be maintained Outcome: Progressing

## 2023-04-08 ENCOUNTER — Other Ambulatory Visit: Payer: Self-pay

## 2023-04-08 DIAGNOSIS — E0590 Thyrotoxicosis, unspecified without thyrotoxic crisis or storm: Secondary | ICD-10-CM | POA: Diagnosis not present

## 2023-04-08 LAB — HEPATIC FUNCTION PANEL
ALT: 39 U/L (ref 0–44)
AST: 26 U/L (ref 15–41)
Albumin: 3.5 g/dL (ref 3.5–5.0)
Alkaline Phosphatase: 59 U/L (ref 38–126)
Bilirubin, Direct: <0.1 mg/dL (ref 0.0–0.2)
Total Bilirubin: 0.5 mg/dL (ref 0.0–1.2)
Total Protein: 6.6 g/dL (ref 6.5–8.1)

## 2023-04-08 LAB — CBC WITH DIFFERENTIAL/PLATELET
Abs Immature Granulocytes: 0.02 10*3/uL (ref 0.00–0.07)
Basophils Absolute: 0 10*3/uL (ref 0.0–0.1)
Basophils Relative: 0 %
Eosinophils Absolute: 0.2 10*3/uL (ref 0.0–0.5)
Eosinophils Relative: 2 %
HCT: 43.1 % (ref 36.0–46.0)
Hemoglobin: 14.5 g/dL (ref 12.0–15.0)
Immature Granulocytes: 0 %
Lymphocytes Relative: 45 %
Lymphs Abs: 4 10*3/uL (ref 0.7–4.0)
MCH: 29.3 pg (ref 26.0–34.0)
MCHC: 33.6 g/dL (ref 30.0–36.0)
MCV: 87.1 fL (ref 80.0–100.0)
Monocytes Absolute: 0.6 10*3/uL (ref 0.1–1.0)
Monocytes Relative: 6 %
Neutro Abs: 4.1 10*3/uL (ref 1.7–7.7)
Neutrophils Relative %: 47 %
Platelets: 290 10*3/uL (ref 150–400)
RBC: 4.95 MIL/uL (ref 3.87–5.11)
RDW: 13 % (ref 11.5–15.5)
WBC: 8.9 10*3/uL (ref 4.0–10.5)
nRBC: 0 % (ref 0.0–0.2)

## 2023-04-08 LAB — BASIC METABOLIC PANEL
Anion gap: 9 (ref 5–15)
BUN: 16 mg/dL (ref 6–20)
CO2: 18 mmol/L — ABNORMAL LOW (ref 22–32)
Calcium: 9.2 mg/dL (ref 8.9–10.3)
Chloride: 112 mmol/L — ABNORMAL HIGH (ref 98–111)
Creatinine, Ser: 0.53 mg/dL (ref 0.44–1.00)
GFR, Estimated: >60 mL/min (ref >60)
Glucose, Bld: 93 mg/dL (ref 70–99)
Potassium: 3.6 mmol/L (ref 3.5–5.1)
Sodium: 139 mmol/L (ref 135–145)

## 2023-04-08 LAB — T4, FREE: Free T4: 2.41 ng/dL — ABNORMAL HIGH (ref 0.61–1.12)

## 2023-04-08 LAB — MAGNESIUM: Magnesium: 1.9 mg/dL (ref 1.7–2.4)

## 2023-04-08 LAB — PHOSPHORUS: Phosphorus: 4.7 mg/dL — ABNORMAL HIGH (ref 2.5–4.6)

## 2023-04-08 MED ORDER — ACETAMINOPHEN 325 MG PO TABS: 650.0000 mg | ORAL_TABLET | Freq: Three times a day (TID) | ORAL | Status: AC | PRN

## 2023-04-08 MED ORDER — ATENOLOL 25 MG PO TABS: 75.0000 mg | ORAL_TABLET | Freq: Every day | ORAL | Status: DC

## 2023-04-08 MED ORDER — VITAMIN D (ERGOCALCIFEROL) 1.25 MG (50000 UNIT) PO CAPS
50000.0000 [IU] | ORAL_CAPSULE | ORAL | Status: DC
  Administered 2023-04-08: 50000 [IU] via ORAL
  Filled 2023-04-08: qty 1

## 2023-04-08 MED ORDER — ATENOLOL 25 MG PO TABS
25.0000 mg | ORAL_TABLET | Freq: Once | ORAL | Status: AC
  Administered 2023-04-08: 25 mg via ORAL
  Filled 2023-04-08: qty 1

## 2023-04-08 MED ORDER — ATENOLOL 25 MG PO TABS
25.0000 mg | ORAL_TABLET | Freq: Every evening | ORAL | 2 refills | Status: AC | PRN
  Filled 2023-04-08: qty 30, 30d supply, fill #0

## 2023-04-08 MED ORDER — METHIMAZOLE 10 MG PO TABS
20.0000 mg | ORAL_TABLET | Freq: Every day | ORAL | 11 refills | Status: AC
  Filled 2023-04-08: qty 60, 30d supply, fill #0
  Filled 2023-05-19: qty 60, 30d supply, fill #1

## 2023-04-08 MED ORDER — VITAMIN D (ERGOCALCIFEROL) 1.25 MG (50000 UNIT) PO CAPS
50000.0000 [IU] | ORAL_CAPSULE | ORAL | 0 refills | Status: AC
  Filled 2023-04-08: qty 12, 84d supply, fill #0

## 2023-04-08 MED ORDER — ATENOLOL 25 MG PO TABS
75.0000 mg | ORAL_TABLET | Freq: Every day | ORAL | 11 refills | Status: AC
  Filled 2023-04-08: qty 120, 30d supply, fill #0
  Filled 2023-05-19: qty 120, 30d supply, fill #1
  Filled 2023-08-19: qty 120, 30d supply, fill #2
  Filled 2023-09-20: qty 120, 30d supply, fill #3
  Filled 2023-11-02: qty 120, 30d supply, fill #4
  Filled 2023-12-17: qty 120, 30d supply, fill #5
  Filled 2024-01-25: qty 120, 30d supply, fill #6
  Filled 2024-03-15: qty 120, 30d supply, fill #7

## 2023-04-08 NOTE — Discharge Summary (Signed)
[facility] Discharge Summary   Patient: Theresa Carpenter ZOX:[id number]  PCP: Noralyn Pick, NP  Date of admission: 04/05/2023   Date of discharge:  04/08/2023     Discharge Diagnoses:  Principal Problem:   Thyrotoxicosis Active Problems:   Essential hypertension   Sinus tachycardia   Anxiety and depression   Migraine   Hyperthyroidism   Admitted From: Home Disposition:  Home   Recommendations for Outpatient Follow-up:  F/u PCP in 1 wk, monitor BP  F/u endocrine in 1-2 weeks Follow up LABS/TEST: TSH and free T4 level in 1 to 2 weeks   Follow-up Information     Simon Rhein, MD Follow up in 1 week(s).   Specialty: Endocrinology Contact information: [address] [place] [address] [phone]         Noralyn Pick, NP Follow up in 1 week(s).   Contact information: [address] [place] [address] [phone]                Diet recommendation: Cardiac diet  Activity: The patient is advised to gradually reintroduce usual activities, as tolerated  Discharge Condition: stable  Code Status: Full code   History of present illness: As per the H and P dictated on admission Hospital Course:  Theresa Carpenter is 43 y.o. female with history of hypertension, hyperlipidemia, cyclic vomiting syndrome, who presented to the ED with tachycardia.  Patient was recently admitted on 2/6 and was started on treatment for thyrotoxicosis but left AMA prior to completing treatment.  On arrival this admission she reports she has had continued palpitations and dizziness.  In the ED she was hypertensive to 151/108 and tachycardic to 127.  Labs are mostly unremarkable.  TSH less than 0.01 with free T4 of 3.09, unchanged from prior.  She was initiated on PTU and propranolol and admitted.    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 US thyroid: Mildly  heterogeneous thyroid gland without discrete nodules. S/p propranolol ED in the ED, started atenolol 50 daily now. S/p PTU 500 in the ED, on methimazole now, tachycardia is improved.  Patient was discharged on methimazole 20 mg p.o. daily and advised to follow-up with endocrinology in 1 to 2 weeks.  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 S/p atenolol 50 mg p.o. daily. S/p amlodipine d/c'd on 2/12, increase Atenolol due to tachycardia. On 2/12 increased atenolol 75 mg po daily and  25 at bedtime p.o. as needed if blood pressure.  Patient was advised to monitor BP at home and follow with PCP to titrate medications accordingly.   # Sinus tachycardia: In setting of thyrotoxicosis as above.  Heart rate improved with atenolol. # Migraine: Continue home dose Topamax # Anxiety depression: Continue home meds  Body mass index is 40.6 kg/m.  Nutrition Interventions:  Patient was ambulatory without any assistance. On the day of the discharge the patient's vitals were stable, and no other acute medical condition were reported by patient. the patient was felt safe to be discharge at Home.  Consultants: None Procedures: NOne  Discharge Exam: General: Appear in no distress, no Rash; Oral Mucosa Clear, moist. Cardiovascular: S1 and S2 Present, no Murmur, Respiratory: normal respiratory effort, Bilateral Air entry present and no Crackles, no wheezes Abdomen: Bowel Sound present, Soft and no tenderness, no hernia Extremities:  no Pedal edema, no calf tenderness Neurology: alert and oriented to time, place, and person affect appropriate.  Filed Weights   04/05/23 1555  Weight: 94.3 kg   Vitals:   04/08/23 0750 04/08/23 1038  BP: (!) 129/108 (!) 153/92  Pulse: (!) 107 99  Resp: 16   Temp: 98.3 F (36.8 C)   SpO2: 100% 100%    DISCHARGE MEDICATION: Allergies as of 04/08/2023   No Known  Allergies      Medication List     STOP taking these medications    amoxicillin-clavulanate 875-125 MG tablet Commonly known as: AUGMENTIN   atorvastatin 20 MG tablet Commonly known as: LIPITOR   HYDROcodone-acetaminophen 5-325 MG tablet Commonly known as: NORCO/VICODIN   hydrOXYzine 25 MG tablet Commonly known as: ATARAX   Maxalt 5 MG tablet Generic drug: rizatriptan   metoprolol succinate 50 MG 24 hr tablet Commonly known as: TOPROL-XL   naproxen sodium 220 MG tablet Commonly known as: ALEVE   ondansetron 4 MG disintegrating tablet Commonly known as: Zofran ODT       TAKE these medications    acetaminophen 325 MG tablet Commonly known as: TYLENOL Take 2 tablets (650 mg total) by mouth 3 (three) times daily as needed for mild pain (pain score 1-3), fever or headache (or Fever >/= 101).   atenolol 25 MG tablet Commonly known as: TENORMIN Take 1 tablet (25 mg total) by mouth at bedtime as needed (SBP >150).   atenolol 25 MG tablet Commonly known as: TENORMIN Take 3 tablets (75 mg total) by mouth daily. Start taking on: April 09, 2023   ibuprofen 200 MG tablet Commonly known as: ADVIL Take 200 mg by mouth every 6 (six) hours as needed for headache or mild pain (pain score 1-3).   methimazole 10 MG tablet Commonly known as: TAPAZOLE Take 2 tablets (20 mg total) by mouth daily. Start taking on: April 09, 2023   promethazine 25 MG tablet Commonly known as: PHENERGAN Take 25 mg by mouth every 6 (six) hours as needed for nausea.   topiramate 25 MG tablet Commonly known as: TOPAMAX Take 25 mg by mouth 2 (two) times daily.   Vitamin D (Ergocalciferol) 1.25 MG (50000 UNIT) Caps capsule Commonly known as: DRISDOL Take 1 capsule (50,000 Units total) by mouth every 7 (seven) days.       No Known Allergies Discharge Instructions     Ambulatory referral to Endocrinology   Complete by: As directed    Ambulatory referral to Endocrinology   Complete  by: As directed    Call MD for:  difficulty breathing, headache or visual disturbances   Complete by: As directed    Call MD for:  extreme fatigue   Complete by: As directed    Call MD for:  persistant dizziness or light-headedness   Complete by: As directed    Call MD for:  persistant nausea and vomiting   Complete by: As directed    Call MD for:  severe uncontrolled pain   Complete by: As directed    Call MD for:  temperature >100.4   Complete by: As directed    Diet - low sodium heart healthy   Complete by: As directed    Discharge instructions   Complete by: As directed    F/u PCP in 1 wk, monitor BP  F/u endocrine in 1-2 weeks   Increase activity slowly   Complete by: As directed        The results of significant  diagnostics from this hospitalization (including imaging, microbiology, ancillary and laboratory) are listed below for reference.    Significant Diagnostic Studies: DG Chest Port 1 View Result Date: 04/05/2023 CLINICAL DATA:  Tachycardia. EXAM: PORTABLE CHEST 1 VIEW COMPARISON:  04/01/2023 FINDINGS: The cardiomediastinal contours are normal. The lungs are clear. Pulmonary vasculature is normal. No consolidation, pleural effusion, or pneumothorax. No acute osseous abnormalities are seen. IMPRESSION: No active disease. Electronically Signed   By: Narda Rutherford M.D.   On: 04/05/2023 17:05   US THYROID Result Date: 04/02/2023 CLINICAL DATA:  Hyperthyroid. EXAM: THYROID ULTRASOUND TECHNIQUE: Ultrasound examination of the thyroid gland and adjacent soft tissues was performed. COMPARISON:  None Available. FINDINGS: Parenchymal Echotexture: Mildly heterogenous Isthmus: 0.3 cm Right lobe: 4.7 x 1.2 x 2.1 cm Left lobe: 4.3 x 1.9 x 1.9 cm _________________________________________________________ Estimated total number of nodules >/= 1 cm: 0 Number of spongiform nodules >/=  2 cm not described below (TR1): 0 Number of mixed cystic and solid nodules >/= 1.5 cm not described below  (TR2): 0 _________________________________________________________ No discrete nodules are seen within the thyroid gland. No enlarged or abnormal appearing lymph nodes are identified. IMPRESSION: Mildly heterogeneous thyroid gland without discrete nodules. Electronically Signed   By: Irish Lack M.D.   On: 04/02/2023 08:10   CT Angio Head Neck W WO CM Result Date: 04/01/2023 CLINICAL DATA:  High blood pressure and tachycardia, numbness in both arms, frequent headaches EXAM: CT ANGIOGRAPHY HEAD AND NECK WITH AND WITHOUT CONTRAST TECHNIQUE: Multidetector CT imaging of the head and neck was performed using the standard protocol during bolus administration of intravenous contrast. Multiplanar CT image reconstructions and MIPs were obtained to evaluate the vascular anatomy. Carotid stenosis measurements (when applicable) are obtained utilizing NASCET criteria, using the distal internal carotid diameter as the denominator. RADIATION DOSE REDUCTION: This exam was performed according to the departmental dose-optimization program which includes automated exposure control, adjustment of the mA and/or kV according to patient size and/or use of iterative reconstruction technique. CONTRAST:  75mL OMNIPAQUE IOHEXOL 350 MG/ML SOLN COMPARISON:  05/31/2007 CT head, no prior CTA FINDINGS: CT HEAD FINDINGS Brain: No evidence of acute infarct, hemorrhage, mass, mass effect, or midline shift. No hydrocephalus or extra-axial fluid collection. Pituitary and craniocervical junction within normal limits. Vascular: No hyperdense vessel. Skull: Negative for fracture or focal lesion. Sinuses/Orbits: No acute finding. Other: The mastoid air cells are well aerated. CTA NECK FINDINGS Aortic arch: Two-vessel arch with a common origin of the brachiocephalic and left common carotid arteries. Imaged portion shows no evidence of aneurysm or dissection. No significant stenosis of the major arch vessel origins. Right carotid system: No evidence of  stenosis, dissection, or occlusion. Left carotid system: No evidence of stenosis, dissection, or occlusion. Vertebral arteries: No evidence of stenosis, dissection, or occlusion. Skeleton: No acute osseous abnormality. Other neck: No acute finding. Upper chest: No focal pulmonary opacity or pleural effusion. Review of the MIP images confirms the above findings CTA HEAD FINDINGS Anterior circulation: Both internal carotid arteries are patent to the termini, without significant stenosis. A1 segments patent. Normal anterior communicating artery. Anterior cerebral arteries are patent to their distal aspects without significant stenosis. No M1 stenosis or occlusion. MCA branches perfused to their distal aspects without significant stenosis. Posterior circulation: Vertebral arteries patent to the vertebrobasilar junction without significant stenosis. The left PICA is patent. The right PICA is not well seen. Basilar patent to its distal aspect without significant stenosis. Superior cerebellar arteries patent proximally. Patent P1 segments. PCAs  perfused to their distal aspects without significant stenosis. The bilateral posterior communicating arteries are diminutive but patent. Venous sinuses: As permitted by contrast timing, patent. Anatomic variants: None significant. No evidence of aneurysm or vascular malformation. Review of the MIP images confirms the above findings IMPRESSION: 1. No acute intracranial process. 2. No intracranial large vessel occlusion or significant stenosis. No evidence of aneurysm or vascular malformation. 3. No hemodynamically significant stenosis in the neck. Electronically Signed   By: Wiliam Ke M.D.   On: 04/01/2023 18:58   DG Chest 2 View Result Date: 04/01/2023 CLINICAL DATA:  43 year old female with tachycardia, hypertension. EXAM: CHEST - 2 VIEW COMPARISON:  Chest radiograph 02/12/2018 and earlier. FINDINGS: PA and lateral views 1105 hours. Low lung volumes. Cardiac and mediastinal  contours remain normal. Visualized tracheal air column is within normal limits. Curvilinear bilateral perihilar opacity most resembling atelectasis. No pneumothorax, pulmonary edema, pleural effusion or other confluent opacity. Stable cholecystectomy clips. No acute osseous abnormality identified. Negative visible bowel gas. IMPRESSION: Low lung volumes with perihilar atelectasis. No other cardiopulmonary abnormality. Electronically Signed   By: Odessa Fleming M.D.   On: 04/01/2023 11:34    Microbiology: No results found for this or any previous visit (from the past 240 hours).   Labs: CBC: Recent Labs  Lab 04/02/23 0449 04/05/23 1625 04/08/23 0417  WBC 9.2 7.0 8.9  NEUTROABS  --   --  4.1  HGB 14.7 14.7 14.5  HCT 44.6 44.3 43.1  MCV 90.7 89.5 87.1  PLT 302 294 290   Basic Metabolic Panel: Recent Labs  Lab 04/02/23 0449 04/05/23 1625 04/06/23 0652 04/07/23 0858 04/08/23 0417  NA 140 142 139  --  139  K 3.3* 3.3* 3.5  --  3.6  CL 111 109 110  --  112*  CO2 19* 20* 19*  --  18*  GLUCOSE 96 124* 97  --  93  BUN 13 17 12   --  16  CREATININE 0.61 0.73 0.61  --  0.53  CALCIUM 8.7* 9.3 8.8*  --  9.2  MG  --   --   --  1.9 1.9  PHOS  --   --   --  2.6 4.7*   Liver Function Tests: Recent Labs  Lab 04/07/23 0858 04/08/23 0417  AST 36 26  ALT 46* 39  ALKPHOS 64 59  BILITOT 0.8 0.5  PROT 7.2 6.6  ALBUMIN 4.0 3.5   No results for input(s): "LIPASE", "AMYLASE" in the last 168 hours. No results for input(s): "AMMONIA" in the last 168 hours. Cardiac Enzymes: No results for input(s): "CKTOTAL", "CKMB", "CKMBINDEX", "TROPONINI" in the last 168 hours. BNP (last 3 results) No results for input(s): "BNP" in the last 8760 hours. CBG: No results for input(s): "GLUCAP" in the last 168 hours.  Time spent: 35 minutes  Signed:  Gillis Santa  [facility] 04/08/2023 11:11 AM

## 2023-04-08 NOTE — Plan of Care (Signed)

## 2023-04-26 ENCOUNTER — Other Ambulatory Visit: Payer: Self-pay

## 2023-04-26 ENCOUNTER — Inpatient Hospital Stay

## 2023-04-26 ENCOUNTER — Inpatient Hospital Stay
Admission: EM | Admit: 2023-04-26 | Discharge: 2023-04-29 | DRG: 394 | Disposition: A | Discharge status: Home / Self Care | Attending: Internal Medicine | Admitting: Internal Medicine

## 2023-04-26 DIAGNOSIS — F419 Anxiety disorder, unspecified: Secondary | ICD-10-CM | POA: Diagnosis present

## 2023-04-26 DIAGNOSIS — R112 Nausea with vomiting, unspecified: Secondary | ICD-10-CM | POA: Diagnosis present

## 2023-04-26 DIAGNOSIS — E860 Dehydration: Secondary | ICD-10-CM | POA: Diagnosis present

## 2023-04-26 DIAGNOSIS — R1115 Cyclical vomiting syndrome unrelated to migraine: Principal | ICD-10-CM | POA: Diagnosis present

## 2023-04-26 DIAGNOSIS — Z1152 Encounter for screening for COVID-19: Secondary | ICD-10-CM | POA: Diagnosis not present

## 2023-04-26 DIAGNOSIS — E039 Hypothyroidism, unspecified: Secondary | ICD-10-CM | POA: Diagnosis present

## 2023-04-26 DIAGNOSIS — Z79899 Other long term (current) drug therapy: Secondary | ICD-10-CM | POA: Diagnosis not present

## 2023-04-26 DIAGNOSIS — Z8349 Family history of other endocrine, nutritional and metabolic diseases: Secondary | ICD-10-CM | POA: Diagnosis not present

## 2023-04-26 DIAGNOSIS — G43909 Migraine, unspecified, not intractable, without status migrainosus: Secondary | ICD-10-CM | POA: Diagnosis present

## 2023-04-26 DIAGNOSIS — E876 Hypokalemia: Secondary | ICD-10-CM | POA: Diagnosis present

## 2023-04-26 DIAGNOSIS — F32A Depression, unspecified: Secondary | ICD-10-CM | POA: Diagnosis present

## 2023-04-26 DIAGNOSIS — Z6841 Body Mass Index (BMI) 40.0 and over, adult: Secondary | ICD-10-CM | POA: Diagnosis not present

## 2023-04-26 DIAGNOSIS — Z8249 Family history of ischemic heart disease and other diseases of the circulatory system: Secondary | ICD-10-CM | POA: Diagnosis not present

## 2023-04-26 DIAGNOSIS — E0590 Thyrotoxicosis, unspecified without thyrotoxic crisis or storm: Secondary | ICD-10-CM | POA: Diagnosis present

## 2023-04-26 DIAGNOSIS — Z87891 Personal history of nicotine dependence: Secondary | ICD-10-CM | POA: Diagnosis not present

## 2023-04-26 DIAGNOSIS — E785 Hyperlipidemia, unspecified: Secondary | ICD-10-CM | POA: Diagnosis present

## 2023-04-26 DIAGNOSIS — E872 Acidosis, unspecified: Secondary | ICD-10-CM | POA: Diagnosis present

## 2023-04-26 DIAGNOSIS — I10 Essential (primary) hypertension: Secondary | ICD-10-CM | POA: Diagnosis present

## 2023-04-26 DIAGNOSIS — R000 Tachycardia, unspecified: Secondary | ICD-10-CM | POA: Diagnosis present

## 2023-04-26 LAB — URINALYSIS, ROUTINE W REFLEX MICROSCOPIC
Bilirubin Urine: NEGATIVE
Glucose, UA: NEGATIVE mg/dL
Ketones, ur: 20 mg/dL — AB
Leukocytes,Ua: NEGATIVE
Nitrite: NEGATIVE
Protein, ur: 100 mg/dL — AB
RBC / HPF: >50 RBC/hpf (ref 0–5)
Specific Gravity, Urine: 1.028 (ref 1.005–1.030)
pH: 5 (ref 5.0–8.0)

## 2023-04-26 LAB — LIPASE, BLOOD: Lipase: 26 U/L (ref 11–51)

## 2023-04-26 LAB — COMPREHENSIVE METABOLIC PANEL
ALT: 33 U/L (ref 0–44)
AST: 28 U/L (ref 15–41)
Albumin: 4.5 g/dL (ref 3.5–5.0)
Alkaline Phosphatase: 84 U/L (ref 38–126)
Anion gap: 11 (ref 5–15)
BUN: 12 mg/dL (ref 6–20)
CO2: 18 mmol/L — ABNORMAL LOW (ref 22–32)
Calcium: 9.8 mg/dL (ref 8.9–10.3)
Chloride: 113 mmol/L — ABNORMAL HIGH (ref 98–111)
Creatinine, Ser: 0.69 mg/dL (ref 0.44–1.00)
GFR, Estimated: >60 mL/min (ref >60)
Glucose, Bld: 157 mg/dL — ABNORMAL HIGH (ref 70–99)
Potassium: 3.9 mmol/L (ref 3.5–5.1)
Sodium: 142 mmol/L (ref 135–145)
Total Bilirubin: 0.9 mg/dL (ref 0.0–1.2)
Total Protein: 8.4 g/dL — ABNORMAL HIGH (ref 6.5–8.1)

## 2023-04-26 LAB — CBC
HCT: 47.9 % — ABNORMAL HIGH (ref 36.0–46.0)
Hemoglobin: 16.4 g/dL — ABNORMAL HIGH (ref 12.0–15.0)
MCH: 29.4 pg (ref 26.0–34.0)
MCHC: 34.2 g/dL (ref 30.0–36.0)
MCV: 86 fL (ref 80.0–100.0)
Platelets: 333 10*3/uL (ref 150–400)
RBC: 5.57 MIL/uL — ABNORMAL HIGH (ref 3.87–5.11)
RDW: 13.2 % (ref 11.5–15.5)
WBC: 10.5 10*3/uL (ref 4.0–10.5)
nRBC: 0 % (ref 0.0–0.2)

## 2023-04-26 LAB — RESP PANEL BY RT-PCR (RSV, FLU A&B, COVID)  RVPGX2
Influenza A by PCR: NEGATIVE
Influenza B by PCR: NEGATIVE
Resp Syncytial Virus by PCR: NEGATIVE
SARS Coronavirus 2 by RT PCR: NEGATIVE

## 2023-04-26 LAB — T4, FREE: Free T4: 1.4 ng/dL — ABNORMAL HIGH (ref 0.61–1.12)

## 2023-04-26 LAB — TSH: TSH: <0.01 u[IU]/mL — ABNORMAL LOW (ref 0.350–4.500)

## 2023-04-26 LAB — POC URINE PREG, ED: Preg Test, Ur: NEGATIVE

## 2023-04-26 LAB — MAGNESIUM: Magnesium: 1.8 mg/dL (ref 1.7–2.4)

## 2023-04-26 MED ORDER — ATENOLOL 50 MG PO TABS
75.0000 mg | ORAL_TABLET | Freq: Every day | ORAL | Status: DC
  Administered 2023-04-27 – 2023-04-29 (×3): 75 mg via ORAL
  Filled 2023-04-26: qty 1
  Filled 2023-04-26: qty 3
  Filled 2023-04-26: qty 1

## 2023-04-26 MED ORDER — ONDANSETRON HCL 4 MG PO TABS: 4.0000 mg | ORAL_TABLET | Freq: Four times a day (QID) | ORAL | Status: DC | PRN

## 2023-04-26 MED ORDER — PROPYLTHIOURACIL 50 MG PO TABS
500.0000 mg | ORAL_TABLET | Freq: Once | ORAL | Status: AC
  Administered 2023-04-26: 500 mg via ORAL
  Filled 2023-04-26: qty 10

## 2023-04-26 MED ORDER — DROPERIDOL 2.5 MG/ML IJ SOLN
2.5000 mg | Freq: Once | INTRAMUSCULAR | Status: DC
  Filled 2023-04-26: qty 2

## 2023-04-26 MED ORDER — ACETAMINOPHEN 650 MG RE SUPP: 650.0000 mg | Freq: Four times a day (QID) | RECTAL | Status: DC | PRN

## 2023-04-26 MED ORDER — HALOPERIDOL LACTATE 5 MG/ML IJ SOLN
2.0000 mg | Freq: Four times a day (QID) | INTRAMUSCULAR | Status: DC | PRN
  Administered 2023-04-27: 2 mg via INTRAMUSCULAR
  Filled 2023-04-26: qty 1

## 2023-04-26 MED ORDER — HALOPERIDOL 1 MG PO TABS: 1.0000 mg | ORAL_TABLET | Freq: Four times a day (QID) | ORAL | Status: DC | PRN

## 2023-04-26 MED ORDER — ONDANSETRON HCL 4 MG/2ML IJ SOLN
4.0000 mg | Freq: Four times a day (QID) | INTRAMUSCULAR | Status: DC | PRN
  Administered 2023-04-27 (×2): 4 mg via INTRAVENOUS
  Filled 2023-04-26 (×2): qty 2

## 2023-04-26 MED ORDER — HALOPERIDOL LACTATE 5 MG/ML IJ SOLN: 2.0000 mg | Freq: Four times a day (QID) | INTRAMUSCULAR | Status: DC | PRN

## 2023-04-26 MED ORDER — LACTATED RINGERS IV SOLN: INTRAVENOUS | Status: DC

## 2023-04-26 MED ORDER — LACTATED RINGERS IV BOLUS
1000.0000 mL | Freq: Once | INTRAVENOUS | Status: AC
  Administered 2023-04-26: 1000 mL via INTRAVENOUS

## 2023-04-26 MED ORDER — TOPIRAMATE 25 MG PO TABS
25.0000 mg | ORAL_TABLET | Freq: Two times a day (BID) | ORAL | Status: DC
  Administered 2023-04-26 – 2023-04-29 (×6): 25 mg via ORAL
  Filled 2023-04-26 (×6): qty 1

## 2023-04-26 MED ORDER — DROPERIDOL 2.5 MG/ML IJ SOLN
2.5000 mg | Freq: Once | INTRAMUSCULAR | Status: AC
  Administered 2023-04-26: 2.5 mg via INTRAMUSCULAR
  Filled 2023-04-26: qty 2

## 2023-04-26 MED ORDER — ACETAMINOPHEN 325 MG PO TABS
650.0000 mg | ORAL_TABLET | Freq: Four times a day (QID) | ORAL | Status: DC | PRN
  Administered 2023-04-29: 650 mg via ORAL
  Filled 2023-04-26 (×2): qty 2

## 2023-04-26 MED ORDER — HALOPERIDOL 2 MG PO TABS: 2.0000 mg | ORAL_TABLET | Freq: Four times a day (QID) | ORAL | Status: DC | PRN

## 2023-04-26 MED ORDER — ATENOLOL 25 MG PO TABS
25.0000 mg | ORAL_TABLET | Freq: Every evening | ORAL | Status: DC | PRN
  Administered 2023-04-28: 25 mg via ORAL
  Filled 2023-04-26 (×2): qty 1

## 2023-04-26 MED ORDER — METHIMAZOLE 10 MG PO TABS
20.0000 mg | ORAL_TABLET | Freq: Every day | ORAL | Status: DC
Start: 2023-04-27 — End: 2023-04-29
  Administered 2023-04-27 – 2023-04-29 (×3): 20 mg via ORAL
  Filled 2023-04-26 (×3): qty 2

## 2023-04-26 MED ORDER — SENNOSIDES-DOCUSATE SODIUM 8.6-50 MG PO TABS: 1.0000 | ORAL_TABLET | Freq: Every evening | ORAL | Status: DC | PRN

## 2023-04-26 MED ORDER — PROMETHAZINE HCL 25 MG RE SUPP: 25.0000 mg | Freq: Four times a day (QID) | RECTAL | 0 refills | Status: DC | PRN

## 2023-04-26 MED ORDER — HEPARIN SODIUM (PORCINE) 5000 UNIT/ML IJ SOLN
5000.0000 [IU] | Freq: Three times a day (TID) | INTRAMUSCULAR | Status: DC
  Administered 2023-04-26 – 2023-04-29 (×8): 5000 [IU] via SUBCUTANEOUS
  Filled 2023-04-26 (×7): qty 1

## 2023-04-26 MED ORDER — DROPERIDOL 2.5 MG/ML IJ SOLN
2.5000 mg | Freq: Once | INTRAMUSCULAR | Status: AC
  Administered 2023-04-26: 2.5 mg via INTRAMUSCULAR

## 2023-04-26 MED ORDER — PROPRANOLOL HCL 20 MG PO TABS
60.0000 mg | ORAL_TABLET | Freq: Once | ORAL | Status: AC
  Administered 2023-04-26: 60 mg via ORAL
  Filled 2023-04-26: qty 3

## 2023-04-26 MED ORDER — HALOPERIDOL 0.5 MG PO TABS: 2.0000 mg | ORAL_TABLET | Freq: Four times a day (QID) | ORAL | Status: DC | PRN

## 2023-04-26 NOTE — Assessment & Plan Note (Signed)
 Secondary to intractable nausea and vomiting send home thyroid medication

## 2023-04-26 NOTE — H&P (Addendum)
 History and Physical   Theresa Carpenter ZOX:[id number] DOB: 1980/03/03 DOA: 04/26/2023  PCP: Theresa Pick, NP  Patient coming from: Home  I have personally briefly reviewed patient's old medical records in [facility] EMR.  Chief Concern: Vomiting and abdominal pain  HPI: [name] is a 43 year old female with history of hypothyroid, cyclic vomiting syndrome, hypertension, hyperlipidemia, who presents emergency department for chief concerns of vomiting and abdominal pain since 4 AM this morning.  Vitals in the ED showed temperature 99.7, respiration of 18, heart rate of 119, blood pressure 153/100, SpO2 100% on room air.  Serum sodium is 142, potassium 3.9, chloride 113, bicarb 18, BUN of 12, serum creatinine 0.69, EGFR greater than 60, nonfasting glucose 157, WBC 10.5, hemoglobin 16.4, platelets of 333.  COVID/influenza A/influenza B/RSV PCR were negative.  UA was negative for leukocytes, nitrates, WBC.  Had rare bacteria.  ED treatment: LR 1 L bolus, propranolol 60 Milligrams one-time dose, droperidol 2.5 mg injection one-time dose, PTU 500 mg p.o. one-time dose. ------------------------------- At bedside, patient is able to tell me her first and last name, age, location, current calendar year.  She reports she has been nausea and not tolerating p.o. medication intake since 04/25/2023.  She reports her vomitus has been bile, green and orange in color and bitter tasting.  She denies blood in her vomitus.  She denies dysuria, hematuria, diarrhea, chest pain, shortness of breath, swelling of her lower extremity, syncope.  Social history: She is at home with her mom.  She denies current tobacco use.  She is a former tobacco user, quitting several years ago.  She denies EtOH and recreational drug use.  ROS: Constitutional: no weight change, no fever ENT/Mouth: no sore throat, no rhinorrhea Eyes: no eye pain, no vision changes Cardiovascular: no chest pain, no dyspnea,  no edema,  + palpitations Respiratory: no cough, no sputum, no wheezing Gastrointestinal: + nausea, + vomiting, no diarrhea, no constipation Genitourinary: no urinary incontinence, no dysuria, no hematuria Musculoskeletal: no arthralgias, no myalgias Skin: no skin lesions, no pruritus, Neuro: + weakness, no loss of consciousness, no syncope Psych: no anxiety, no depression, + decrease appetite Heme/Lymph: no bruising, no bleeding  ED Course: Discussed with EDP, patient requiring hospitalization for chief concerns of intractable nausea and vomiting and missing her home hypothyroid medication.  Assessment/Plan  Principal Problem:   Intractable nausea and vomiting Active Problems:   Essential hypertension   Metabolic acidosis   Sinus tachycardia   Dyslipidemia   Anxiety and depression   Migraine   Hyperthyroidism   Assessment and Plan:  * Intractable nausea and vomiting Symptomatic support Status post LR 1 bolus per EDP Continue with LR infusion at 125 mL/h, 1 day ordered Ondansetron 4 mg IV every 6 hours as needed for nausea, vomiting, 5 days ordered; Haldol 2 mg p.o./IM every 6 hours as needed for intractable nausea and vomiting not relieved with ondansetron IV  Essential hypertension Home atenolol 75 mg daily resumed Home atenolol 25 mg p.o. nightly as needed for SBP greater than 150 resumed  Sinus tachycardia Secondary to intractable nausea and vomiting send home thyroid medication  Metabolic acidosis Secondary to p.o. loss/intractable nausea and vomiting Recheck BMP in the a.m. Treat per above  Hyperthyroidism Home methimazole 20 mg daily resumed Atenolol 75 mg daily resumed Home atenolol 25 mg nightly as needed for SBP greater than 150, resumed  Migraine Home topiramate 25 mg p.o. twice daily resumed  Chart reviewed.   DVT prophylaxis: Heparin  5000 units subcutaneous every 8 hours Code Status: Full code Diet: N.p.o. as patient has intractable nausea and vomiting; with  orders to advance as tolerated to clear liquid Family Communication: A phone call was offered, patient declined Disposition Plan: Pending clinical course Consults called: None at this time Admission status: PCU, inpatient  Past Medical History:  Diagnosis Date   Cyclic vomiting syndrome    Family history of adverse reaction to anesthesia    BROTHER-N/V   Hypertension    Tachycardia    Past Surgical History:  Procedure Laterality Date   CHOLECYSTECTOMY     HEMORRHOID SURGERY N/A 01/27/2017   Procedure: HEMORRHOIDECTOMY;  Surgeon: Carolan Shiver, MD;  Location: [facility] ORS;  Service: General;  Laterality: N/A;   INCISION AND DRAINAGE PERIRECTAL ABSCESS N/A 08/27/2022   Procedure: IRRIGATION AND DEBRIDEMENT PERIRECTAL ABSCESS;  Surgeon: Sung Amabile, DO;  Location: [facility] ORS;  Service: General;  Laterality: N/A;   RECTAL EXAM UNDER ANESTHESIA N/A 01/27/2017   Procedure: RECTAL EXAM UNDER ANESTHESIA;  Surgeon: Carolan Shiver, MD;  Location: [facility] ORS;  Service: General;  Laterality: N/A;   Social History:  reports that she has quit smoking. Her smoking use included cigarettes. She has a 3 pack-year smoking history. She has never used smokeless tobacco. She reports current alcohol use. She reports that she does not currently use drugs after having used the following drugs: Marijuana. Frequency: 2.00 times per week.  No Known Allergies Family History  Problem Relation Age of Onset   Thyroid disease Mother    Hypertension Brother    Family history: Family history reviewed and pertinent for mother with thyroid disease.  Prior to Admission medications   Medication Sig Start Date End Date Taking? Authorizing Provider  promethazine (PHENERGAN) 25 MG suppository Place 1 suppository (25 mg total) rectally every 6 (six) hours as needed for nausea or vomiting. 04/26/23  Yes Merwyn Katos, MD  acetaminophen (TYLENOL) 325 MG tablet Take 2 tablets (650 mg total) by mouth 3 (three) times daily  as needed for mild pain (pain score 1-3), fever or headache (or Fever >/= 101). 04/08/23   Gillis Santa, MD  atenolol (TENORMIN) 25 MG tablet Take 3 tablets (75 mg total) by mouth daily and Take 1 tablet (25 mg total) by mouth at bedtime as needed (SBP >150). 04/09/23 04/08/24  Gillis Santa, MD  atenolol (TENORMIN) 25 MG tablet Take 1 tablet (25 mg total) by mouth at bedtime as needed (SBP >150). 04/08/23 07/07/23  Gillis Santa, MD  ibuprofen (ADVIL) 200 MG tablet Take 200 mg by mouth every 6 (six) hours as needed for headache or mild pain (pain score 1-3).    [provider]  methimazole (TAPAZOLE) 10 MG tablet Take 2 tablets (20 mg total) by mouth daily. 04/09/23 04/08/24  Gillis Santa, MD  promethazine (PHENERGAN) 25 MG tablet Take 25 mg by mouth every 6 (six) hours as needed for nausea. 07/30/16   [provider]  topiramate (TOPAMAX) 25 MG tablet Take 25 mg by mouth 2 (two) times daily. 09/14/16   [provider]  Vitamin D, Ergocalciferol, (DRISDOL) 1.25 MG (50000 UNIT) CAPS capsule Take 1 capsule (50,000 Units total) by mouth every 7 (seven) days. 04/08/23 07/07/23  Gillis Santa, MD   Physical Exam: Vitals:   04/26/23 1201 04/26/23 1824 04/26/23 1929 04/26/23 2046  BP: (!) 164/120 (!) 153/100 (!) 162/105 (!) 161/108  Pulse: (!) 140 (!) 119 (!) 118 (!) 111  Resp: 20 18 20 14   Temp: 99.1 F (  37.3 C) 99.7 F (37.6 C)    TempSrc: Oral Oral    SpO2: 98% 100% 100% 100%   Constitutional: appears appropriate, NAD, calm Eyes: PERRL, lids and conjunctivae normal ENMT: Mucous membranes are moist. Posterior pharynx clear of any exudate or lesions. Age-appropriate dentition. Hearing appropriate Neck: normal, supple, no masses, no thyromegaly Respiratory: clear to auscultation bilaterally, no wheezing, no crackles. Normal respiratory effort. No accessory muscle use.  Cardiovascular: Tachycardiac, with regular rhythm, no murmurs / rubs / gallops. No extremity edema. 2+ pedal  pulses. No carotid bruits.  Abdomen: Obese abdomen, no tenderness, no masses palpated, no hepatosplenomegaly. Bowel sounds positive.  Musculoskeletal: no clubbing / cyanosis. No joint deformity upper and lower extremities. Good ROM, no contractures, no atrophy. Normal muscle tone.  Skin: no rashes, lesions, ulcers. No induration Neurologic: Sensation intact. Strength 5/5 in all 4.  Psychiatric: Normal judgment and insight. Alert and oriented x 3. Normal mood.   EKG: Ordered and pending completion at this time  Chest x-ray on Admission: Ordered and pending completion at this time   Labs on Admission: I have personally reviewed following labs  CBC: Recent Labs  Lab 04/26/23 1204  WBC 10.5  HGB 16.4*  HCT 47.9*  MCV 86.0  PLT 333   Basic Metabolic Panel: Recent Labs  Lab 04/26/23 1204  NA 142  K 3.9  CL 113*  CO2 18*  GLUCOSE 157*  BUN 12  CREATININE 0.69  CALCIUM 9.8  MG 1.8   GFR: CrCl cannot be calculated (Unknown ideal weight.).  Liver Function Tests: Recent Labs  Lab 04/26/23 1204  AST 28  ALT 33  ALKPHOS 84  BILITOT 0.9  PROT 8.4*  ALBUMIN 4.5   Recent Labs  Lab 04/26/23 1204  LIPASE 26   Thyroid Function Tests: Recent Labs    04/26/23 1633  TSH <0.010*  FREET4 1.40*   Urine analysis:    Component Value Date/Time   COLORURINE YELLOW (A) 04/26/2023 1204   APPEARANCEUR TURBID (A) 04/26/2023 1204   APPEARANCEUR Clear 03/26/2014 1240   LABSPEC 1.028 04/26/2023 1204   LABSPEC 1.018 03/26/2014 1240   PHURINE 5.0 04/26/2023 1204   GLUCOSEU NEGATIVE 04/26/2023 1204   GLUCOSEU Negative 03/26/2014 1240   HGBUR LARGE (A) 04/26/2023 1204   BILIRUBINUR NEGATIVE 04/26/2023 1204   BILIRUBINUR Negative 03/26/2014 1240   KETONESUR 20 (A) 04/26/2023 1204   PROTEINUR 100 (A) 04/26/2023 1204   NITRITE NEGATIVE 04/26/2023 1204   LEUKOCYTESUR NEGATIVE 04/26/2023 1204   LEUKOCYTESUR Negative 03/26/2014 1240   This document was prepared using Dragon Voice  Recognition software and may include unintentional dictation errors.  Dr. Sedalia Muta [facility]  If 7PM-7AM, please contact overnight-coverage provider If 7AM-7PM, please contact day attending provider [email or web address]  04/26/2023, 9:03 PM

## 2023-04-26 NOTE — ED Notes (Signed)
 CCMD called and pt placed on cardiac monitoring.

## 2023-04-26 NOTE — Assessment & Plan Note (Addendum)
 Symptomatic support Status post LR 1 bolus per EDP Continue with LR infusion at 125 mL/h, 1 day ordered Ondansetron 4 mg IV every 6 hours as needed for nausea, vomiting, 5 days ordered; Haldol 2 mg p.o./IM every 6 hours as needed for intractable nausea and vomiting not relieved with ondansetron IV

## 2023-04-26 NOTE — ED Notes (Signed)
 This RN with 2 unsuccessful PIV attempts. Primary RN made aware.

## 2023-04-26 NOTE — ED Notes (Signed)
 Attempted to unsuccessful IV attempts.

## 2023-04-26 NOTE — ED Provider Notes (Signed)
 [facility] Provider Note   Event Date/Time   First MD Initiated Contact with Patient 04/26/23 1235     (approximate) History  Emesis  HPI Theresa Carpenter is a 43 y.o. female with stated past medical history of cyclic vomiting syndrome who presents complaining of midepigastric abdominal pain, nausea/vomiting over the last 24 hours.  Patient states that she has been unable to keep down her normal home medication promethazine for the symptoms.  Patient states that normally a hot bath will control her symptoms however she has taken 7 over the past day with no improvement of her symptoms.  Of note as well, patient has also recently been treated for thyroid disease in her hospital but states that she has been taking her thyroid medication on time and as prescribed ROS: Patient currently denies any vision changes, tinnitus, difficulty speaking, facial droop, sore throat, chest pain, shortness of breath, diarrhea, dysuria, or weakness/numbness/paresthesias in any extremity   Physical Exam  Triage Vital Signs: ED Triage Vitals  Encounter Vitals Group     BP 04/26/23 1201 (!) 164/120     Systolic BP Percentile --      Diastolic BP Percentile --      Pulse Rate 04/26/23 1201 (!) 140     Resp 04/26/23 1201 20     Temp 04/26/23 1201 99.1 F (37.3 C)     Temp Source 04/26/23 1201 Oral     SpO2 04/26/23 1201 98 %     Weight --      Height --      Head Circumference --      Peak Flow --      Pain Score 04/26/23 1202 10     Pain Loc --      Pain Education --      Exclude from Growth Chart --    Most recent vital signs: Vitals:   04/26/23 1201  BP: (!) 164/120  Pulse: (!) 140  Resp: 20  Temp: 99.1 F (37.3 C)  SpO2: 98%   General: Awake, oriented x4. CV:  Good peripheral perfusion.  Resp:  Normal effort.  Abd:  No distention.  Other:  Middle-aged obese African-American female resting in stretcher occasionally retching in obvious distress secondary to  nausea ED Results / Procedures / Treatments  Labs (all labs ordered are listed, but only abnormal results are displayed) Labs Reviewed  COMPREHENSIVE METABOLIC PANEL - Abnormal; Notable for the following components:      Result Value   Chloride 113 (*)    CO2 18 (*)    Glucose, Bld 157 (*)    Total Protein 8.4 (*)    All other components within normal limits  CBC - Abnormal; Notable for the following components:   RBC 5.57 (*)    Hemoglobin 16.4 (*)    HCT 47.9 (*)    All other components within normal limits  URINALYSIS, ROUTINE W REFLEX MICROSCOPIC - Abnormal; Notable for the following components:   Color, Urine YELLOW (*)    APPearance TURBID (*)    Hgb urine dipstick LARGE (*)    Ketones, ur 20 (*)    Protein, ur 100 (*)    Bacteria, UA RARE (*)    All other components within normal limits  LIPASE, BLOOD  MAGNESIUM  POC URINE PREG, ED   PROCEDURES: Critical Care performed: No Procedures MEDICATIONS ORDERED IN ED: Medications - No data to display IMPRESSION / MDM / ASSESSMENT AND PLAN / ED COURSE  I reviewed the triage vital signs and the nursing notes.                             The patient is on the cardiac monitor to evaluate for evidence of arrhythmia and/or significant heart rate changes. Patient's presentation is most consistent with acute presentation with potential threat to life or bodily function. Patient presents for acute nausea/vomiting The cause of the patient's symptoms is not clear, but the patient is overall well appearing and is suspected to have a transient course of illness.  Patient has known history of cyclic vomiting syndrome and presents with similar symptoms today.  Given History and Exam there does not appear to be an emergent cause of the symptoms such as small bowel obstruction, coronary syndrome, bowel ischemia, DKA, pancreatitis, appendicitis, other acute abdomen or other emergent problem.  Reassessment: After treatment, the patient is  feeling much better, tolerating PO fluids, and shows no signs of dehydration.   Disposition: Care of this patient will be signed out to the oncoming physician at the end of my shift.  All pertinent patient information conveyed and all questions answered.  All further care and disposition decisions will be made by the oncoming physician.   FINAL CLINICAL IMPRESSION(S) / ED DIAGNOSES   Final diagnoses:  None   Rx / DC Orders   ED Discharge Orders     None      Note:  This document was prepared using Dragon voice recognition software and may include unintentional dictation errors.   Merwyn Katos, MD 04/27/23 [phone]

## 2023-04-26 NOTE — Assessment & Plan Note (Addendum)
 Home atenolol 75 mg daily resumed Home atenolol 25 mg p.o. nightly as needed for SBP greater than 150 resumed

## 2023-04-26 NOTE — ED Provider Notes (Signed)
-----------------------------------------   7:31 PM on 04/26/2023 -----------------------------------------  I took over care of this patient from Dr. Vicente Males.  The patient has received fluids and that her nausea and vomiting had improved for some time.  However, her TSH is still undetectable, T4 is still elevated, and her heart rate still remains in the 120s, which is concerning for ongoing thyrotoxicosis.  The patient then had some recurrent vomiting as well.  I have ordered additional droperidol.  I also ordered propranolol and PTU to treat the hyperthyroidism.  The patient will need admission for further management.  I consulted Dr. Sedalia Muta from the hospitalist service; based on our discussion she agrees to evaluate the patient for admission.   Dionne Bucy, MD 04/26/23 Nadara Mustard   Dionne Bucy, MD 04/26/23 2015

## 2023-04-26 NOTE — Assessment & Plan Note (Signed)
 Home methimazole 20 mg daily resumed Atenolol 75 mg daily resumed Home atenolol 25 mg nightly as needed for SBP greater than 150, resumed

## 2023-04-26 NOTE — Assessment & Plan Note (Signed)
 Home topiramate 25 mg p.o. twice daily resumed

## 2023-04-26 NOTE — Assessment & Plan Note (Addendum)
 Secondary to p.o. loss/intractable nausea and vomiting Recheck BMP in the a.m. Treat per above

## 2023-04-26 NOTE — Hospital Course (Signed)
 Machine a Fisher is a 43 year old female with history of hypothyroid, cyclic vomiting syndrome, hypertension, hyperlipidemia, who presents emergency department for chief concerns of vomiting and abdominal pain since 4 AM this morning.  Vitals in the ED showed temperature 99.7, respiration of 18, heart rate of 119, blood pressure 153/100, SpO2 100% on room air.  Serum sodium is 142, potassium 3.9, chloride 113, bicarb 18, BUN of 12, serum creatinine 0.69, EGFR greater than 60, nonfasting glucose 157, WBC 10.5, hemoglobin 16.4, platelets of 333.  COVID/influenza A/influenza B/RSV PCR were negative.  UA was negative for leukocytes, nitrates, WBC.  Had rare bacteria.  ED treatment: LR 1 L bolus, propranolol 60 Milligrams one-time dose, droperidol 2.5 mg injection one-time dose, PTU 500 mg p.o. one-time dose.

## 2023-04-26 NOTE — ED Notes (Signed)
 Pt reports that they have been vomiting since 0400 this morning and that they have vomited over 20x this morning. Pt states that they have a pain trigger with the emesis syndrome and they had a toothache last night and took OTC tylenol and thinks that the pain is what started this episode. Pt states that they smoke cigarettes and last one was a week ago, hasn't smoked marijuana in the last 30 days. Pt states that hot water baths help sometimes help the pain. Pain in abdomen below the belly button and is a 10/10.

## 2023-04-26 NOTE — ED Triage Notes (Signed)
 Patient reports vomiting and abdominal pain since 0400.

## 2023-04-27 ENCOUNTER — Encounter: Payer: Self-pay | Admitting: Internal Medicine

## 2023-04-27 DIAGNOSIS — R112 Nausea with vomiting, unspecified: Secondary | ICD-10-CM | POA: Diagnosis not present

## 2023-04-27 LAB — BASIC METABOLIC PANEL
Anion gap: 11 (ref 5–15)
BUN: 14 mg/dL (ref 6–20)
CO2: 22 mmol/L (ref 22–32)
Calcium: 9.4 mg/dL (ref 8.9–10.3)
Chloride: 108 mmol/L (ref 98–111)
Creatinine, Ser: 0.69 mg/dL (ref 0.44–1.00)
GFR, Estimated: >60 mL/min (ref >60)
Glucose, Bld: 119 mg/dL — ABNORMAL HIGH (ref 70–99)
Potassium: 3.1 mmol/L — ABNORMAL LOW (ref 3.5–5.1)
Sodium: 141 mmol/L (ref 135–145)

## 2023-04-27 LAB — CBC
HCT: 41.5 % (ref 36.0–46.0)
Hemoglobin: 14.4 g/dL (ref 12.0–15.0)
MCH: 29.9 pg (ref 26.0–34.0)
MCHC: 34.7 g/dL (ref 30.0–36.0)
MCV: 86.3 fL (ref 80.0–100.0)
Platelets: 281 10*3/uL (ref 150–400)
RBC: 4.81 MIL/uL (ref 3.87–5.11)
RDW: 13.2 % (ref 11.5–15.5)
WBC: 15 10*3/uL — ABNORMAL HIGH (ref 4.0–10.5)
nRBC: 0 % (ref 0.0–0.2)

## 2023-04-27 LAB — PHOSPHORUS: Phosphorus: 2.9 mg/dL (ref 2.5–4.6)

## 2023-04-27 MED ORDER — SODIUM CHLORIDE 0.9 % IV SOLN
12.5000 mg | Freq: Four times a day (QID) | INTRAVENOUS | Status: DC | PRN
  Filled 2023-04-27: qty 0.5

## 2023-04-27 MED ORDER — HYDRALAZINE HCL 50 MG PO TABS
25.0000 mg | ORAL_TABLET | Freq: Four times a day (QID) | ORAL | Status: DC | PRN
Start: 2023-04-27 — End: 2023-04-29

## 2023-04-27 MED ORDER — MORPHINE SULFATE (PF) 2 MG/ML IV SOLN
2.0000 mg | INTRAVENOUS | Status: AC | PRN
  Administered 2023-04-27 – 2023-04-28 (×3): 2 mg via INTRAVENOUS
  Filled 2023-04-27 (×3): qty 1

## 2023-04-27 MED ORDER — PROCHLORPERAZINE EDISYLATE 10 MG/2ML IJ SOLN
5.0000 mg | INTRAMUSCULAR | Status: DC | PRN
  Administered 2023-04-27 – 2023-04-28 (×5): 5 mg via INTRAVENOUS
  Filled 2023-04-27 (×7): qty 1

## 2023-04-27 MED ORDER — POTASSIUM CHLORIDE IN NACL 40-0.9 MEQ/L-% IV SOLN
INTRAVENOUS | Status: AC
  Filled 2023-04-27 (×3): qty 1000

## 2023-04-27 NOTE — Progress Notes (Addendum)
 Progress Note    Theresa Carpenter  LKG:[id number] DOB: Oct 17, 1980  DOA: 04/26/2023 PCP: Noralyn Pick, NP      Brief Narrative:    Medical records reviewed and are as summarized below:  Theresa Carpenter is a 43 y.o. female with medical history significant for hyperthyroidism, cyclic vomiting syndrome, hypertension, hyperlipidemia, who presented to the hospital with nausea, vomiting and abdominal pain.  Vitals in the ED showed temperature 99.7, respiration of 18, heart rate of 119, blood pressure 153/100, SpO2 100% on room air.   Serum sodium is 142, potassium 3.9, chloride 113, bicarb 18, BUN of 12, serum creatinine 0.69, EGFR greater than 60, nonfasting glucose 157, WBC 10.5, hemoglobin 16.4, platelets of 333.   COVID/influenza A/influenza B/RSV PCR were negative.  In the ED, she was given initially given 1 L of Ringer's lactate bolus, propylthiouracil 500 mg x 1 dose and propranolol 60 mg x 1 dose.      Assessment/Plan:   Principal Problem:   Intractable nausea and vomiting Active Problems:   Essential hypertension   Metabolic acidosis   Sinus tachycardia   Dyslipidemia   Anxiety and depression   Migraine   Hyperthyroidism   Body mass index is 40.6 kg/m.  (Morbid obesity)    Intractable nausea and vomiting, history of cyclic vomiting syndrome: Discontinue IV Ringer's lactate infusion and start normal saline infusion with KCl for hydration and potassium repletion. She said she had no relief with IV Zofran.  She said Phenergan does not work for her.  Try Haldol as needed for nausea/vomiting.  Will use Compazine as needed if Haldol does not work.  Analgesics as needed for pain. I wonder if she has cannabis hyperemesis syndrome given history of marijuana use and some relief with hot showers/bath. EKG ordered and reviewed by me today showed normal sinus rhythm, short PR interval, QTc interval of 429 She said she last used marijuana about a month ago.  She says  she was using it for anxiety and to help with sleep at nighttime.  She only uses marijuana every now and then.    Hypokalemia: Replete potassium intravenously   Hyperthyroidism: Continue home methimazole and atenolol.   Sinus tachycardia: Probably from combination of dehydration and hyperthyroidism.  Heart rate is better.   Comorbidities include hypertension, anxiety, depression, migraine headaches   Transfer from progressive care unit to MedSurg unit.    Diet Order             Diet NPO time specified Except for: Sips with Meds, Ice Chips  Diet effective now                            Consultants: None  Procedures: None    Medications:    atenolol  75 mg Oral Daily   heparin  5,000 Units Subcutaneous Q8H   methimazole  20 mg Oral Daily   topiramate  25 mg Oral BID   Continuous Infusions:  0.9 % NaCl with KCl 40 mEq / L 100 mL/hr at 04/27/23 1037     Anti-infectives (From admission, onward)    None              Family Communication/Anticipated D/C date and plan/Code Status   DVT prophylaxis: heparin injection 5,000 Units Start: 04/26/23 2200 Place TED hose Start: 04/26/23 1923     Code Status: Full Code  Family Communication: None Disposition Plan: Plan to discharge home in  1 to 2 days   Status is: Inpatient Remains inpatient appropriate because: Intractable nausea and vomiting       Subjective:   Interval events noted.  She complains of abdominal pain and vomiting.  She says she has vomited about 10 times this morning.  Zofran has not helped and Phenergan does not work for her either.  She was taking hot baths at home and that did seemed to help a little bit.  Objective:    Vitals:   04/27/23 0911 04/27/23 1000 04/27/23 1030 04/27/23 1036  BP: (!) 159/90  138/82 138/82  Pulse: 90  84 84  Resp:   16 16  Temp:    98 F (36.7 C)  TempSrc:    Oral  SpO2:  100%  100%  Height:       No data  found.   Intake/Output Summary (Last 24 hours) at 04/27/2023 1103 Last data filed at 04/27/2023 1037 Gross per 24 hour  Intake 1250.64 ml  Output --  Net 1250.64 ml   There were no vitals filed for this visit.  Exam:  GEN: NAD SKIN: Warm and dry EYES: No pallor or icterus ENT: MMM CV: RRR PULM: CTA B ABD: soft, obese, NT, +BS CNS: AAO x 3, non focal EXT: No edema or tenderness        Data Reviewed:   I have personally reviewed following labs and imaging studies:  Labs: Labs show the following:   Basic Metabolic Panel: Recent Labs  Lab 04/26/23 1204 04/27/23 0609  NA 142 141  K 3.9 3.1*  CL 113* 108  CO2 18* 22  GLUCOSE 157* 119*  BUN 12 14  CREATININE 0.69 0.69  CALCIUM 9.8 9.4  MG 1.8  --   PHOS  --  2.9   GFR CrCl cannot be calculated (Unknown ideal weight.). Liver Function Tests: Recent Labs  Lab 04/26/23 1204  AST 28  ALT 33  ALKPHOS 84  BILITOT 0.9  PROT 8.4*  ALBUMIN 4.5   Recent Labs  Lab 04/26/23 1204  LIPASE 26   No results for input(s): "AMMONIA" in the last 168 hours. Coagulation profile No results for input(s): "INR", "PROTIME" in the last 168 hours.  CBC: Recent Labs  Lab 04/26/23 1204 04/27/23 0609  WBC 10.5 15.0*  HGB 16.4* 14.4  HCT 47.9* 41.5  MCV 86.0 86.3  PLT 333 281   Cardiac Enzymes: No results for input(s): "CKTOTAL", "CKMB", "CKMBINDEX", "TROPONINI" in the last 168 hours. BNP (last 3 results) No results for input(s): "PROBNP" in the last 8760 hours. CBG: No results for input(s): "GLUCAP" in the last 168 hours. D-Dimer: No results for input(s): "DDIMER" in the last 72 hours. Hgb A1c: No results for input(s): "HGBA1C" in the last 72 hours. Lipid Profile: No results for input(s): "CHOL", "HDL", "LDLCALC", "TRIG", "CHOLHDL", "LDLDIRECT" in the last 72 hours. Thyroid function studies: Recent Labs    04/26/23 1633  TSH <0.010*   Anemia work up: No results for input(s): "VITAMINB12", "FOLATE",  "FERRITIN", "TIBC", "IRON", "RETICCTPCT" in the last 72 hours. Sepsis Labs: Recent Labs  Lab 04/26/23 1204 04/27/23 0609  WBC 10.5 15.0*    Microbiology Recent Results (from the past 240 hours)  Resp panel by RT-PCR (RSV, Flu A&B, Covid) Anterior Nasal Swab     Status: None   Collection Time: 04/26/23  2:38 PM   Specimen: Anterior Nasal Swab  Result Value Ref Range Status   SARS Coronavirus 2 by RT PCR NEGATIVE NEGATIVE Final  Comment: (NOTE) SARS-CoV-2 target nucleic acids are NOT DETECTED.  The SARS-CoV-2 RNA is generally detectable in upper respiratory specimens during the acute phase of infection. The lowest concentration of SARS-CoV-2 viral copies this assay can detect is 138 copies/mL. A negative result does not preclude SARS-Cov-2 infection and should not be used as the sole basis for treatment or other patient management decisions. A negative result may occur with  improper specimen collection/handling, submission of specimen other than nasopharyngeal swab, presence of viral mutation(s) within the areas targeted by this assay, and inadequate number of viral copies(<138 copies/mL). A negative result must be combined with clinical observations, patient history, and epidemiological information. The expected result is Negative.  Fact Sheet for Patients:  BloggerCourse.com  Fact Sheet for Healthcare Providers:  SeriousBroker.it  This test is no t yet approved or cleared by the [place] FDA and  has been authorized for detection and/or diagnosis of SARS-CoV-2 by FDA under an Emergency Use Authorization (EUA). This EUA will remain  in effect (meaning this test can be used) for the duration of the COVID-19 declaration under Section 564(b)(1) of the Act, 21 U.S.C.section 360bbb-3(b)(1), unless the authorization is terminated  or revoked sooner.       Influenza A by PCR NEGATIVE NEGATIVE Final   Influenza B by PCR  NEGATIVE NEGATIVE Final    Comment: (NOTE) The Xpert Xpress SARS-CoV-2/FLU/RSV plus assay is intended as an aid in the diagnosis of influenza from Nasopharyngeal swab specimens and should not be used as a sole basis for treatment. Nasal washings and aspirates are unacceptable for Xpert Xpress SARS-CoV-2/FLU/RSV testing.  Fact Sheet for Patients: BloggerCourse.com  Fact Sheet for Healthcare Providers: SeriousBroker.it  This test is not yet approved or cleared by the [place] FDA and has been authorized for detection and/or diagnosis of SARS-CoV-2 by FDA under an Emergency Use Authorization (EUA). This EUA will remain in effect (meaning this test can be used) for the duration of the COVID-19 declaration under Section 564(b)(1) of the Act, 21 U.S.C. section 360bbb-3(b)(1), unless the authorization is terminated or revoked.     Resp Syncytial Virus by PCR NEGATIVE NEGATIVE Final    Comment: (NOTE) Fact Sheet for Patients: BloggerCourse.com  Fact Sheet for Healthcare Providers: SeriousBroker.it  This test is not yet approved or cleared by the [place] FDA and has been authorized for detection and/or diagnosis of SARS-CoV-2 by FDA under an Emergency Use Authorization (EUA). This EUA will remain in effect (meaning this test can be used) for the duration of the COVID-19 declaration under Section 564(b)(1) of the Act, 21 U.S.C. section 360bbb-3(b)(1), unless the authorization is terminated or revoked.  Performed at [facility], [address] [place], [place] [address]     Procedures and diagnostic studies:  DG Chest [facility] 1 View Result Date: 04/27/2023 CLINICAL DATA:  Nausea and vomiting EXAM: PORTABLE CHEST 1 VIEW COMPARISON:  04/05/2023 FINDINGS: The heart size and mediastinal contours are within normal limits. Both lungs are clear. The visualized skeletal  structures are unremarkable. IMPRESSION: No active disease. Electronically Signed   By: Alcide Clever M.D.   On: 04/27/2023 00:21               LOS: 1 day   Dolph Tavano  [facility]   Pager on [email or web address]. If 7PM-7AM, please contact night-coverage at [email or web address]     04/27/2023, 11:03 AM

## 2023-04-28 DIAGNOSIS — R112 Nausea with vomiting, unspecified: Secondary | ICD-10-CM | POA: Diagnosis not present

## 2023-04-28 LAB — BASIC METABOLIC PANEL
Anion gap: 10 (ref 5–15)
BUN: 16 mg/dL (ref 6–20)
CO2: 20 mmol/L — ABNORMAL LOW (ref 22–32)
Calcium: 9 mg/dL (ref 8.9–10.3)
Chloride: 107 mmol/L (ref 98–111)
Creatinine, Ser: 0.61 mg/dL (ref 0.44–1.00)
GFR, Estimated: >60 mL/min (ref >60)
Glucose, Bld: 86 mg/dL (ref 70–99)
Potassium: 3.3 mmol/L — ABNORMAL LOW (ref 3.5–5.1)
Sodium: 137 mmol/L (ref 135–145)

## 2023-04-28 LAB — CBC WITH DIFFERENTIAL/PLATELET
Abs Immature Granulocytes: 0.03 10*3/uL (ref 0.00–0.07)
Basophils Absolute: 0 10*3/uL (ref 0.0–0.1)
Basophils Relative: 0 %
Eosinophils Absolute: 0 10*3/uL (ref 0.0–0.5)
Eosinophils Relative: 0 %
HCT: 42.5 % (ref 36.0–46.0)
Hemoglobin: 14.2 g/dL (ref 12.0–15.0)
Immature Granulocytes: 0 %
Lymphocytes Relative: 23 %
Lymphs Abs: 2.9 10*3/uL (ref 0.7–4.0)
MCH: 29.6 pg (ref 26.0–34.0)
MCHC: 33.4 g/dL (ref 30.0–36.0)
MCV: 88.7 fL (ref 80.0–100.0)
Monocytes Absolute: 0.8 10*3/uL (ref 0.1–1.0)
Monocytes Relative: 6 %
Neutro Abs: 8.7 10*3/uL — ABNORMAL HIGH (ref 1.7–7.7)
Neutrophils Relative %: 71 %
Platelets: 224 10*3/uL (ref 150–400)
RBC: 4.79 MIL/uL (ref 3.87–5.11)
RDW: 13.4 % (ref 11.5–15.5)
WBC: 12.5 10*3/uL — ABNORMAL HIGH (ref 4.0–10.5)
nRBC: 0 % (ref 0.0–0.2)

## 2023-04-28 LAB — PHOSPHORUS: Phosphorus: 2.9 mg/dL (ref 2.5–4.6)

## 2023-04-28 LAB — MAGNESIUM: Magnesium: 1.9 mg/dL (ref 1.7–2.4)

## 2023-04-28 MED ORDER — SODIUM CHLORIDE 0.9 % IV SOLN
12.5000 mg | Freq: Four times a day (QID) | INTRAVENOUS | Status: DC
  Administered 2023-04-28 – 2023-04-29 (×4): 12.5 mg via INTRAVENOUS
  Filled 2023-04-28 (×4): qty 12.5
  Filled 2023-04-28: qty 0.5

## 2023-04-28 MED ORDER — FAMOTIDINE IN NACL 20-0.9 MG/50ML-% IV SOLN
20.0000 mg | Freq: Two times a day (BID) | INTRAVENOUS | Status: DC
  Administered 2023-04-28 – 2023-04-29 (×3): 20 mg via INTRAVENOUS
  Filled 2023-04-28 (×3): qty 50

## 2023-04-28 NOTE — Plan of Care (Signed)
  Problem: Activity: Goal: Risk for activity intolerance will decrease Outcome: Progressing   Problem: Elimination: Goal: Will not experience complications related to bowel motility Outcome: Progressing Goal: Will not experience complications related to urinary retention Outcome: Progressing   Problem: Pain Managment: Goal: General experience of comfort will improve and/or be controlled Outcome: Progressing   Problem: Safety: Goal: Ability to remain free from injury will improve Outcome: Progressing   Problem: Skin Integrity: Goal: Risk for impaired skin integrity will decrease Outcome: Progressing

## 2023-04-28 NOTE — Progress Notes (Signed)
 PROGRESS NOTE    Theresa Carpenter  ZOX:[id number] DOB: 06-06-1980 DOA: 04/26/2023 PCP: Noralyn Pick, NP    Brief Narrative:   Theresa Carpenter is a 43 y.o. female with medical history significant for hyperthyroidism, cyclic vomiting syndrome, hypertension, hyperlipidemia, who presented to the hospital with nausea, vomiting and abdominal pain.   Vitals in the ED showed temperature 99.7, respiration of 18, heart rate of 119, blood pressure 153/100, SpO2 100% on room air.   Serum sodium is 142, potassium 3.9, chloride 113, bicarb 18, BUN of 12, serum creatinine 0.69, EGFR greater than 60, nonfasting glucose 157, WBC 10.5, hemoglobin 16.4, platelets of 333.   COVID/influenza A/influenza B/RSV PCR were negative.   In the ED, she was given initially given 1 L of Ringer's lactate bolus, propylthiouracil 500 mg x 1 dose and propranolol 60 mg x 1 dose.  Assessment & Plan:   Principal Problem:   Intractable nausea and vomiting Active Problems:   Essential hypertension   Metabolic acidosis   Sinus tachycardia   Dyslipidemia   Anxiety and depression   Migraine   Hyperthyroidism   Intractable nausea and vomiting, history of cyclic vomiting syndrome: Patient was started recurrent episodes of cyclic vomiting syndrome.  At home relieved with the use of oral promethazine and hot showers.  Patient denies any recent marijuana use making cannabis hyperemesis syndrome less likely etiology.   Plan:  Schedule promethazine  IV Pepcid twice daily  IV fluids  Advance cautiously to full liquids    Hypokalemia: Monitor and replace as needed     Hyperthyroidism: Appears to be undertreated but difficult to interpret in the setting of recent start of methimazole.  For now we will continue home methimazole and atenolol.     Sinus tachycardia: Improved.  Likely secondary to intravascular volume depletion.  Possible contribution from hypothyroidism.     Comorbidities include hypertension, anxiety,  depression, migraine headaches   DVT prophylaxis: Lovenox Code Status: Full Family Communication: None today Disposition Plan: Status is: Inpatient Remains inpatient appropriate because: Intractable nausea and vomiting.  Unable to tolerate p.o.   Level of care: Med-Surg  Consultants:  None  Procedures:  None  Antimicrobials: None   Subjective: Seen and examined.  Reports feeling "miserable" reports persistent nausea, vomiting/dry heaving.  Objective: Vitals:   04/27/23 1829 04/27/23 2030 04/28/23 0422 04/28/23 0748  BP: (!) 160/93 136/85 (!) 143/72 (!) 170/100  Pulse: 95 100 91 96  Resp:  20 18 20   Temp:  98.8 F (37.1 C) 98.5 F (36.9 C) 98.3 F (36.8 C)  TempSrc:   Oral Oral  SpO2: 97% 100% 99% 100%  Height:        Intake/Output Summary (Last 24 hours) at 04/28/2023 1540 Last data filed at 04/28/2023 1017 Gross per 24 hour  Intake 120 ml  Output --  Net 120 ml   There were no vitals filed for this visit.  Examination:  General exam: Appears fatigued Respiratory system: Clear to auscultation. Respiratory effort normal. Cardiovascular system: S1-S2, RRR, no murmurs, no pedal edema Gastrointestinal system: Soft, NT/ND, normal bowel sounds Central nervous system: Alert and oriented. No focal neurological deficits. Extremities: Symmetric 5 x 5 power. Skin: No rashes, lesions or ulcers Psychiatry: Judgement and insight appear normal. Mood & affect appropriate.     Data Reviewed: I have personally reviewed following labs and imaging studies  CBC: Recent Labs  Lab 04/26/23 1204 04/27/23 0609 04/28/23 0508  WBC 10.5 15.0* 12.5*  NEUTROABS  --   --  8.7*  HGB 16.4* 14.4 14.2  HCT 47.9* 41.5 42.5  MCV 86.0 86.3 88.7  PLT 333 281 224   Basic Metabolic Panel: Recent Labs  Lab 04/26/23 1204 04/27/23 0609 04/28/23 0508  NA 142 141 137  K 3.9 3.1* 3.3*  CL 113* 108 107  CO2 18* 22 20*  GLUCOSE 157* 119* 86  BUN 12 14 16   CREATININE 0.69 0.69 0.61   CALCIUM 9.8 9.4 9.0  MG 1.8  --  1.9  PHOS  --  2.9 2.9   GFR: CrCl cannot be calculated (Unknown ideal weight.). Liver Function Tests: Recent Labs  Lab 04/26/23 1204  AST 28  ALT 33  ALKPHOS 84  BILITOT 0.9  PROT 8.4*  ALBUMIN 4.5   Recent Labs  Lab 04/26/23 1204  LIPASE 26   No results for input(s): "AMMONIA" in the last 168 hours. Coagulation Profile: No results for input(s): "INR", "PROTIME" in the last 168 hours. Cardiac Enzymes: No results for input(s): "CKTOTAL", "CKMB", "CKMBINDEX", "TROPONINI" in the last 168 hours. BNP (last 3 results) No results for input(s): "PROBNP" in the last 8760 hours. HbA1C: No results for input(s): "HGBA1C" in the last 72 hours. CBG: No results for input(s): "GLUCAP" in the last 168 hours. Lipid Profile: No results for input(s): "CHOL", "HDL", "LDLCALC", "TRIG", "CHOLHDL", "LDLDIRECT" in the last 72 hours. Thyroid Function Tests: Recent Labs    04/26/23 1633  TSH <0.010*  FREET4 1.40*   Anemia Panel: No results for input(s): "VITAMINB12", "FOLATE", "FERRITIN", "TIBC", "IRON", "RETICCTPCT" in the last 72 hours. Sepsis Labs: No results for input(s): "PROCALCITON", "LATICACIDVEN" in the last 168 hours.  Recent Results (from the past 240 hours)  Resp panel by RT-PCR (RSV, Flu A&B, Covid) Anterior Nasal Swab     Status: None   Collection Time: 04/26/23  2:38 PM   Specimen: Anterior Nasal Swab  Result Value Ref Range Status   SARS Coronavirus 2 by RT PCR NEGATIVE NEGATIVE Final    Comment: (NOTE) SARS-CoV-2 target nucleic acids are NOT DETECTED.  The SARS-CoV-2 RNA is generally detectable in upper respiratory specimens during the acute phase of infection. The lowest concentration of SARS-CoV-2 viral copies this assay can detect is 138 copies/mL. A negative result does not preclude SARS-Cov-2 infection and should not be used as the sole basis for treatment or other patient management decisions. A negative result may occur  with  improper specimen collection/handling, submission of specimen other than nasopharyngeal swab, presence of viral mutation(s) within the areas targeted by this assay, and inadequate number of viral copies(<138 copies/mL). A negative result must be combined with clinical observations, patient history, and epidemiological information. The expected result is Negative.  Fact Sheet for Patients:  BloggerCourse.com  Fact Sheet for Healthcare Providers:  SeriousBroker.it  This test is no t yet approved or cleared by the [place] FDA and  has been authorized for detection and/or diagnosis of SARS-CoV-2 by FDA under an Emergency Use Authorization (EUA). This EUA will remain  in effect (meaning this test can be used) for the duration of the COVID-19 declaration under Section 564(b)(1) of the Act, 21 U.S.C.section 360bbb-3(b)(1), unless the authorization is terminated  or revoked sooner.       Influenza A by PCR NEGATIVE NEGATIVE Final   Influenza B by PCR NEGATIVE NEGATIVE Final    Comment: (NOTE) The Xpert Xpress SARS-CoV-2/FLU/RSV plus assay is intended as an aid in the diagnosis of influenza from Nasopharyngeal swab specimens and should not be used as a  sole basis for treatment. Nasal washings and aspirates are unacceptable for Xpert Xpress SARS-CoV-2/FLU/RSV testing.  Fact Sheet for Patients: BloggerCourse.com  Fact Sheet for Healthcare Providers: SeriousBroker.it  This test is not yet approved or cleared by the [place] FDA and has been authorized for detection and/or diagnosis of SARS-CoV-2 by FDA under an Emergency Use Authorization (EUA). This EUA will remain in effect (meaning this test can be used) for the duration of the COVID-19 declaration under Section 564(b)(1) of the Act, 21 U.S.C. section 360bbb-3(b)(1), unless the authorization is terminated  or revoked.     Resp Syncytial Virus by PCR NEGATIVE NEGATIVE Final    Comment: (NOTE) Fact Sheet for Patients: BloggerCourse.com  Fact Sheet for Healthcare Providers: SeriousBroker.it  This test is not yet approved or cleared by the [place] FDA and has been authorized for detection and/or diagnosis of SARS-CoV-2 by FDA under an Emergency Use Authorization (EUA). This EUA will remain in effect (meaning this test can be used) for the duration of the COVID-19 declaration under Section 564(b)(1) of the Act, 21 U.S.C. section 360bbb-3(b)(1), unless the authorization is terminated or revoked.  Performed at [facility], [address] [place], [place] [address]          Radiology Studies: DG Chest [place] 1 View Result Date: 04/27/2023 CLINICAL DATA:  Nausea and vomiting EXAM: PORTABLE CHEST 1 VIEW COMPARISON:  04/05/2023 FINDINGS: The heart size and mediastinal contours are within normal limits. Both lungs are clear. The visualized skeletal structures are unremarkable. IMPRESSION: No active disease. Electronically Signed   By: Alcide Clever M.D.   On: 04/27/2023 00:21        Scheduled Meds:  atenolol  75 mg Oral Daily   heparin  5,000 Units Subcutaneous Q8H   methimazole  20 mg Oral Daily   topiramate  25 mg Oral BID   Continuous Infusions:  famotidine (PEPCID) IV 20 mg (04/28/23 1252)   promethazine (PHENERGAN) injection (IM or IVPB) 12.5 mg (04/28/23 1218)     LOS: 2 days     Theresa Moore, MD [facility]   If 7PM-7AM, please contact night-coverage  04/28/2023, 3:40 PM

## 2023-04-29 DIAGNOSIS — R112 Nausea with vomiting, unspecified: Secondary | ICD-10-CM | POA: Diagnosis not present

## 2023-04-29 MED ORDER — LABETALOL HCL 5 MG/ML IV SOLN
10.0000 mg | INTRAVENOUS | Status: DC | PRN
  Administered 2023-04-29 (×2): 10 mg via INTRAVENOUS
  Filled 2023-04-29 (×2): qty 4

## 2023-04-29 MED ORDER — FAMOTIDINE 20 MG PO TABS: 20.0000 mg | ORAL_TABLET | Freq: Two times a day (BID) | ORAL | Status: DC

## 2023-04-29 MED ORDER — FAMOTIDINE 20 MG PO TABS: 20.0000 mg | ORAL_TABLET | Freq: Two times a day (BID) | ORAL | 0 refills | Status: AC

## 2023-04-29 MED ORDER — PROMETHAZINE HCL 25 MG PO TABS
25.0000 mg | ORAL_TABLET | Freq: Four times a day (QID) | ORAL | Status: DC
  Administered 2023-04-29: 25 mg via ORAL
  Filled 2023-04-29 (×2): qty 1

## 2023-04-29 MED ORDER — PROMETHAZINE HCL 25 MG PO TABS: 25.0000 mg | ORAL_TABLET | Freq: Four times a day (QID) | ORAL | 0 refills | Status: AC

## 2023-04-29 NOTE — Discharge Summary (Signed)
 Physician Discharge Summary  Theresa Carpenter HYW:[id number] DOB: 09/24/1980 DOA: 04/26/2023  PCP: Noralyn Pick, NP  Admit date: 04/26/2023 Discharge date: 04/29/2023  Admitted From: Home Disposition: Home  Recommendations for Outpatient Follow-up:  Follow up with PCP in 1-2 weeks   Home Health: No Equipment/Devices: None  Discharge Condition: Stable CODE STATUS: Full Diet recommendation: Full liquid initially progressing to soft  Brief/Interim Summary:  Theresa Carpenter is a 43 y.o. female with medical history significant for hyperthyroidism, cyclic vomiting syndrome, hypertension, hyperlipidemia, who presented to the hospital with nausea, vomiting and abdominal pain.   Vitals in the ED showed temperature 99.7, respiration of 18, heart rate of 119, blood pressure 153/100, SpO2 100% on room air.   Serum sodium is 142, potassium 3.9, chloride 113, bicarb 18, BUN of 12, serum creatinine 0.69, EGFR greater than 60, nonfasting glucose 157, WBC 10.5, hemoglobin 16.4, platelets of 333.   COVID/influenza A/influenza B/RSV PCR were negative.   In the ED, she was given initially given 1 L of Ringer's lactate bolus, propylthiouracil 500 mg x 1 dose and propranolol 60 mg x 1 dose.   Symptoms gradually improved with the combination of Pepcid, promethazine, n.p.o. progressing to full liquid diet.  Symptoms have improved but not yet at baseline at time of discharge.  Patient is prescribed oral promethazine, oral Pepcid and advised extensively on dietary recommendations.  I recommend full liquid diet for 2 to 3 days following discharge followed by slow advancement to a soft diet.  Recommend soft bland foods, frequent meals and avoidance of known triggers nausea and vomiting.  Patient expresses understanding.  Discharged home.    Discharge Diagnoses:  Principal Problem:   Intractable nausea and vomiting Active Problems:   Essential hypertension   Metabolic acidosis   Sinus tachycardia    Dyslipidemia   Anxiety and depression   Migraine   Hyperthyroidism    Intractable nausea and vomiting, history of cyclic vomiting syndrome: Patient was started recurrent episodes of cyclic vomiting syndrome.  At home relieved with the use of oral promethazine and hot showers.  Patient denies any recent marijuana use making cannabis hyperemesis syndrome less likely etiology.   Plan:  Scheduled promethazine 25 mg every 6 hours.  Scheduled Pepcid 20 mg twice daily.  Cautiously advance diet as tolerated.  No other changes made at home medication regimen.  Discharge Instructions  Discharge Instructions     Diet - low sodium heart healthy   Complete by: As directed    Increase activity slowly   Complete by: As directed       Allergies as of 04/29/2023   No Known Allergies      Medication List     TAKE these medications    acetaminophen 325 MG tablet Commonly known as: TYLENOL Take 2 tablets (650 mg total) by mouth 3 (three) times daily as needed for mild pain (pain score 1-3), fever or headache (or Fever >/= 101).   atenolol 25 MG tablet Commonly known as: TENORMIN Take 1 tablet (25 mg total) by mouth at bedtime as needed (SBP >150).   atenolol 25 MG tablet Commonly known as: TENORMIN Take 3 tablets (75 mg total) by mouth daily and Take 1 tablet (25 mg total) by mouth at bedtime as needed (SBP >150).   famotidine 20 MG tablet Commonly known as: PEPCID Take 1 tablet (20 mg total) by mouth 2 (two) times daily for 14 days.   ibuprofen 200 MG tablet Commonly known as: ADVIL Take 200  mg by mouth every 6 (six) hours as needed for headache or mild pain (pain score 1-3).   methimazole 10 MG tablet Commonly known as: TAPAZOLE Take 2 tablets (20 mg total) by mouth daily.   promethazine 25 MG tablet Commonly known as: PHENERGAN Take 1 tablet (25 mg total) by mouth every 6 (six) hours. What changed:  when to take this reasons to take this   topiramate 25 MG tablet Commonly  known as: TOPAMAX Take 25 mg by mouth 2 (two) times daily.   Vitamin D (Ergocalciferol) 1.25 MG (50000 UNIT) Caps capsule Commonly known as: DRISDOL Take 1 capsule (50,000 Units total) by mouth every 7 (seven) days.        Follow-up Information     Noralyn Pick, NP .   Contact information: [address] [place] [address] [phone]                No Known Allergies  Consultations: None   Procedures/Studies: DG Chest Port 1 View Result Date: 04/27/2023 CLINICAL DATA:  Nausea and vomiting EXAM: PORTABLE CHEST 1 VIEW COMPARISON:  04/05/2023 FINDINGS: The heart size and mediastinal contours are within normal limits. Both lungs are clear. The visualized skeletal structures are unremarkable. IMPRESSION: No active disease. Electronically Signed   By: Alcide Clever M.D.   On: 04/27/2023 00:21   DG Chest Port 1 View Result Date: 04/05/2023 CLINICAL DATA:  Tachycardia. EXAM: PORTABLE CHEST 1 VIEW COMPARISON:  04/01/2023 FINDINGS: The cardiomediastinal contours are normal. The lungs are clear. Pulmonary vasculature is normal. No consolidation, pleural effusion, or pneumothorax. No acute osseous abnormalities are seen. IMPRESSION: No active disease. Electronically Signed   By: Narda Rutherford M.D.   On: 04/05/2023 17:05   US THYROID Result Date: 04/02/2023 CLINICAL DATA:  Hyperthyroid. EXAM: THYROID ULTRASOUND TECHNIQUE: Ultrasound examination of the thyroid gland and adjacent soft tissues was performed. COMPARISON:  None Available. FINDINGS: Parenchymal Echotexture: Mildly heterogenous Isthmus: 0.3 cm Right lobe: 4.7 x 1.2 x 2.1 cm Left lobe: 4.3 x 1.9 x 1.9 cm _________________________________________________________ Estimated total number of nodules >/= 1 cm: 0 Number of spongiform nodules >/=  2 cm not described below (TR1): 0 Number of mixed cystic and solid nodules >/= 1.5 cm not described below (TR2): 0 _________________________________________________________ No discrete  nodules are seen within the thyroid gland. No enlarged or abnormal appearing lymph nodes are identified. IMPRESSION: Mildly heterogeneous thyroid gland without discrete nodules. Electronically Signed   By: Irish Lack M.D.   On: 04/02/2023 08:10   CT Angio Head Neck W WO CM Result Date: 04/01/2023 CLINICAL DATA:  High blood pressure and tachycardia, numbness in both arms, frequent headaches EXAM: CT ANGIOGRAPHY HEAD AND NECK WITH AND WITHOUT CONTRAST TECHNIQUE: Multidetector CT imaging of the head and neck was performed using the standard protocol during bolus administration of intravenous contrast. Multiplanar CT image reconstructions and MIPs were obtained to evaluate the vascular anatomy. Carotid stenosis measurements (when applicable) are obtained utilizing NASCET criteria, using the distal internal carotid diameter as the denominator. RADIATION DOSE REDUCTION: This exam was performed according to the departmental dose-optimization program which includes automated exposure control, adjustment of the mA and/or kV according to patient size and/or use of iterative reconstruction technique. CONTRAST:  75mL OMNIPAQUE IOHEXOL 350 MG/ML SOLN COMPARISON:  05/31/2007 CT head, no prior CTA FINDINGS: CT HEAD FINDINGS Brain: No evidence of acute infarct, hemorrhage, mass, mass effect, or midline shift. No hydrocephalus or extra-axial fluid collection. Pituitary and craniocervical junction within normal  limits. Vascular: No hyperdense vessel. Skull: Negative for fracture or focal lesion. Sinuses/Orbits: No acute finding. Other: The mastoid air cells are well aerated. CTA NECK FINDINGS Aortic arch: Two-vessel arch with a common origin of the brachiocephalic and left common carotid arteries. Imaged portion shows no evidence of aneurysm or dissection. No significant stenosis of the major arch vessel origins. Right carotid system: No evidence of stenosis, dissection, or occlusion. Left carotid system: No evidence of  stenosis, dissection, or occlusion. Vertebral arteries: No evidence of stenosis, dissection, or occlusion. Skeleton: No acute osseous abnormality. Other neck: No acute finding. Upper chest: No focal pulmonary opacity or pleural effusion. Review of the MIP images confirms the above findings CTA HEAD FINDINGS Anterior circulation: Both internal carotid arteries are patent to the termini, without significant stenosis. A1 segments patent. Normal anterior communicating artery. Anterior cerebral arteries are patent to their distal aspects without significant stenosis. No M1 stenosis or occlusion. MCA branches perfused to their distal aspects without significant stenosis. Posterior circulation: Vertebral arteries patent to the vertebrobasilar junction without significant stenosis. The left PICA is patent. The right PICA is not well seen. Basilar patent to its distal aspect without significant stenosis. Superior cerebellar arteries patent proximally. Patent P1 segments. PCAs perfused to their distal aspects without significant stenosis. The bilateral posterior communicating arteries are diminutive but patent. Venous sinuses: As permitted by contrast timing, patent. Anatomic variants: None significant. No evidence of aneurysm or vascular malformation. Review of the MIP images confirms the above findings IMPRESSION: 1. No acute intracranial process. 2. No intracranial large vessel occlusion or significant stenosis. No evidence of aneurysm or vascular malformation. 3. No hemodynamically significant stenosis in the neck. Electronically Signed   By: Wiliam Ke M.D.   On: 04/01/2023 18:58   DG Chest 2 View Result Date: 04/01/2023 CLINICAL DATA:  43 year old female with tachycardia, hypertension. EXAM: CHEST - 2 VIEW COMPARISON:  Chest radiograph 02/12/2018 and earlier. FINDINGS: PA and lateral views 1105 hours. Low lung volumes. Cardiac and mediastinal contours remain normal. Visualized tracheal air column is within normal  limits. Curvilinear bilateral perihilar opacity most resembling atelectasis. No pneumothorax, pulmonary edema, pleural effusion or other confluent opacity. Stable cholecystectomy clips. No acute osseous abnormality identified. Negative visible bowel gas. IMPRESSION: Low lung volumes with perihilar atelectasis. No other cardiopulmonary abnormality. Electronically Signed   By: Odessa Fleming M.D.   On: 04/01/2023 11:34      Subjective: Seen and examined on day of discharge.  Stable no distress.  Appropriate for discharge home.  Discharge Exam: Vitals:   04/29/23 0554 04/29/23 0749  BP: (!) 166/107 (!) 152/102  Pulse: 93 97  Resp:  20  Temp:  98.5 F (36.9 C)  SpO2:  100%   Vitals:   04/29/23 0111 04/29/23 0524 04/29/23 0554 04/29/23 0749  BP: (!) 158/87 (!) 180/113 (!) 166/107 (!) 152/102  Pulse: 80 91 93 97  Resp:  16  20  Temp:  98.7 F (37.1 C)  98.5 F (36.9 C)  TempSrc:  Oral  Oral  SpO2:  100%  100%  Height:        General: Pt is alert, awake, not in acute distress Cardiovascular: RRR, S1/S2 +, no rubs, no gallops Respiratory: CTA bilaterally, no wheezing, no rhonchi Abdominal: Soft, NT, ND, bowel sounds + Extremities: no edema, no cyanosis    The results of significant diagnostics from this hospitalization (including imaging, microbiology, ancillary and laboratory) are listed below for reference.     Microbiology: Recent Results (from  the past 240 hours)  Resp panel by RT-PCR (RSV, Flu A&B, Covid) Anterior Nasal Swab     Status: None   Collection Time: 04/26/23  2:38 PM   Specimen: Anterior Nasal Swab  Result Value Ref Range Status   SARS Coronavirus 2 by RT PCR NEGATIVE NEGATIVE Final    Comment: (NOTE) SARS-CoV-2 target nucleic acids are NOT DETECTED.  The SARS-CoV-2 RNA is generally detectable in upper respiratory specimens during the acute phase of infection. The lowest concentration of SARS-CoV-2 viral copies this assay can detect is 138 copies/mL. A negative  result does not preclude SARS-Cov-2 infection and should not be used as the sole basis for treatment or other patient management decisions. A negative result may occur with  improper specimen collection/handling, submission of specimen other than nasopharyngeal swab, presence of viral mutation(s) within the areas targeted by this assay, and inadequate number of viral copies(<138 copies/mL). A negative result must be combined with clinical observations, patient history, and epidemiological information. The expected result is Negative.  Fact Sheet for Patients:  BloggerCourse.com  Fact Sheet for Healthcare Providers:  SeriousBroker.it  This test is no t yet approved or cleared by the [place] FDA and  has been authorized for detection and/or diagnosis of SARS-CoV-2 by FDA under an Emergency Use Authorization (EUA). This EUA will remain  in effect (meaning this test can be used) for the duration of the COVID-19 declaration under Section 564(b)(1) of the Act, 21 U.S.C.section 360bbb-3(b)(1), unless the authorization is terminated  or revoked sooner.       Influenza A by PCR NEGATIVE NEGATIVE Final   Influenza B by PCR NEGATIVE NEGATIVE Final    Comment: (NOTE) The Xpert Xpress SARS-CoV-2/FLU/RSV plus assay is intended as an aid in the diagnosis of influenza from Nasopharyngeal swab specimens and should not be used as a sole basis for treatment. Nasal washings and aspirates are unacceptable for Xpert Xpress SARS-CoV-2/FLU/RSV testing.  Fact Sheet for Patients: BloggerCourse.com  Fact Sheet for Healthcare Providers: SeriousBroker.it  This test is not yet approved or cleared by the [place] FDA and has been authorized for detection and/or diagnosis of SARS-CoV-2 by FDA under an Emergency Use Authorization (EUA). This EUA will remain in effect (meaning this test can be used)  for the duration of the COVID-19 declaration under Section 564(b)(1) of the Act, 21 U.S.C. section 360bbb-3(b)(1), unless the authorization is terminated or revoked.     Resp Syncytial Virus by PCR NEGATIVE NEGATIVE Final    Comment: (NOTE) Fact Sheet for Patients: BloggerCourse.com  Fact Sheet for Healthcare Providers: SeriousBroker.it  This test is not yet approved or cleared by the [place] FDA and has been authorized for detection and/or diagnosis of SARS-CoV-2 by FDA under an Emergency Use Authorization (EUA). This EUA will remain in effect (meaning this test can be used) for the duration of the COVID-19 declaration under Section 564(b)(1) of the Act, 21 U.S.C. section 360bbb-3(b)(1), unless the authorization is terminated or revoked.  Performed at [facility], [address] [place], [place] [address]      Labs: BNP (last 3 results) No results for input(s): "BNP" in the last 8760 hours. Basic Metabolic Panel: Recent Labs  Lab 04/26/23 1204 04/27/23 0609 04/28/23 0508  NA 142 141 137  K 3.9 3.1* 3.3*  CL 113* 108 107  CO2 18* 22 20*  GLUCOSE 157* 119* 86  BUN 12 14 16   CREATININE 0.69 0.69 0.61  CALCIUM 9.8 9.4 9.0  MG 1.8  --  1.9  PHOS  --  2.9 2.9   Liver Function Tests: Recent Labs  Lab 04/26/23 1204  AST 28  ALT 33  ALKPHOS 84  BILITOT 0.9  PROT 8.4*  ALBUMIN 4.5   Recent Labs  Lab 04/26/23 1204  LIPASE 26   No results for input(s): "AMMONIA" in the last 168 hours. CBC: Recent Labs  Lab 04/26/23 1204 04/27/23 0609 04/28/23 0508  WBC 10.5 15.0* 12.5*  NEUTROABS  --   --  8.7*  HGB 16.4* 14.4 14.2  HCT 47.9* 41.5 42.5  MCV 86.0 86.3 88.7  PLT 333 281 224   Cardiac Enzymes: No results for input(s): "CKTOTAL", "CKMB", "CKMBINDEX", "TROPONINI" in the last 168 hours. BNP: Invalid input(s): "POCBNP" CBG: No results for input(s): "GLUCAP" in the last 168  hours. D-Dimer No results for input(s): "DDIMER" in the last 72 hours. Hgb A1c No results for input(s): "HGBA1C" in the last 72 hours. Lipid Profile No results for input(s): "CHOL", "HDL", "LDLCALC", "TRIG", "CHOLHDL", "LDLDIRECT" in the last 72 hours. Thyroid function studies Recent Labs    04/26/23 1633  TSH <0.010*   Anemia work up No results for input(s): "VITAMINB12", "FOLATE", "FERRITIN", "TIBC", "IRON", "RETICCTPCT" in the last 72 hours. Urinalysis    Component Value Date/Time   COLORURINE YELLOW (A) 04/26/2023 1204   APPEARANCEUR TURBID (A) 04/26/2023 1204   APPEARANCEUR Clear 03/26/2014 1240   LABSPEC 1.028 04/26/2023 1204   LABSPEC 1.018 03/26/2014 1240   PHURINE 5.0 04/26/2023 1204   GLUCOSEU NEGATIVE 04/26/2023 1204   GLUCOSEU Negative 03/26/2014 1240   HGBUR LARGE (A) 04/26/2023 1204   BILIRUBINUR NEGATIVE 04/26/2023 1204   BILIRUBINUR Negative 03/26/2014 1240   KETONESUR 20 (A) 04/26/2023 1204   PROTEINUR 100 (A) 04/26/2023 1204   NITRITE NEGATIVE 04/26/2023 1204   LEUKOCYTESUR NEGATIVE 04/26/2023 1204   LEUKOCYTESUR Negative 03/26/2014 1240   Sepsis Labs Recent Labs  Lab 04/26/23 1204 04/27/23 0609 04/28/23 0508  WBC 10.5 15.0* 12.5*   Microbiology Recent Results (from the past 240 hours)  Resp panel by RT-PCR (RSV, Flu A&B, Covid) Anterior Nasal Swab     Status: None   Collection Time: 04/26/23  2:38 PM   Specimen: Anterior Nasal Swab  Result Value Ref Range Status   SARS Coronavirus 2 by RT PCR NEGATIVE NEGATIVE Final    Comment: (NOTE) SARS-CoV-2 target nucleic acids are NOT DETECTED.  The SARS-CoV-2 RNA is generally detectable in upper respiratory specimens during the acute phase of infection. The lowest concentration of SARS-CoV-2 viral copies this assay can detect is 138 copies/mL. A negative result does not preclude SARS-Cov-2 infection and should not be used as the sole basis for treatment or other patient management decisions. A  negative result may occur with  improper specimen collection/handling, submission of specimen other than nasopharyngeal swab, presence of viral mutation(s) within the areas targeted by this assay, and inadequate number of viral copies(<138 copies/mL). A negative result must be combined with clinical observations, patient history, and epidemiological information. The expected result is Negative.  Fact Sheet for Patients:  BloggerCourse.com  Fact Sheet for Healthcare Providers:  SeriousBroker.it  This test is no t yet approved or cleared by the [place] FDA and  has been authorized for detection and/or diagnosis of SARS-CoV-2 by FDA under an Emergency Use Authorization (EUA). This EUA will remain  in effect (meaning this test can be used) for the duration of the COVID-19 declaration under Section 564(b)(1) of the Act, 21 U.S.C.section 360bbb-3(b)(1), unless the authorization  is terminated  or revoked sooner.       Influenza A by PCR NEGATIVE NEGATIVE Final   Influenza B by PCR NEGATIVE NEGATIVE Final    Comment: (NOTE) The Xpert Xpress SARS-CoV-2/FLU/RSV plus assay is intended as an aid in the diagnosis of influenza from Nasopharyngeal swab specimens and should not be used as a sole basis for treatment. Nasal washings and aspirates are unacceptable for Xpert Xpress SARS-CoV-2/FLU/RSV testing.  Fact Sheet for Patients: BloggerCourse.com  Fact Sheet for Healthcare Providers: SeriousBroker.it  This test is not yet approved or cleared by the [place] FDA and has been authorized for detection and/or diagnosis of SARS-CoV-2 by FDA under an Emergency Use Authorization (EUA). This EUA will remain in effect (meaning this test can be used) for the duration of the COVID-19 declaration under Section 564(b)(1) of the Act, 21 U.S.C. section 360bbb-3(b)(1), unless the authorization  is terminated or revoked.     Resp Syncytial Virus by PCR NEGATIVE NEGATIVE Final    Comment: (NOTE) Fact Sheet for Patients: BloggerCourse.com  Fact Sheet for Healthcare Providers: SeriousBroker.it  This test is not yet approved or cleared by the [place] FDA and has been authorized for detection and/or diagnosis of SARS-CoV-2 by FDA under an Emergency Use Authorization (EUA). This EUA will remain in effect (meaning this test can be used) for the duration of the COVID-19 declaration under Section 564(b)(1) of the Act, 21 U.S.C. section 360bbb-3(b)(1), unless the authorization is terminated or revoked.  Performed at [facility], [address], [place], [place] [address]      Time coordinating discharge: Over 30 minutes  SIGNED:   Tresa Moore, MD  [facility] 04/29/2023, 2:09 PM Pager   If 7PM-7AM, please contact night-coverage

## 2023-04-29 NOTE — Plan of Care (Signed)
  Problem: Education: Goal: Knowledge of General Education information will improve Description: Including pain rating scale, medication(s)/side effects and non-pharmacologic comfort measures Outcome: Progressing   Problem: Activity: Goal: Risk for activity intolerance will decrease Outcome: Progressing   Problem: Elimination: Goal: Will not experience complications related to urinary retention Outcome: Progressing   Problem: Safety: Goal: Ability to remain free from injury will improve Outcome: Progressing   Problem: Skin Integrity: Goal: Risk for impaired skin integrity will decrease Outcome: Progressing   

## 2023-07-30 ENCOUNTER — Emergency Department

## 2023-07-30 ENCOUNTER — Other Ambulatory Visit: Payer: Self-pay

## 2023-07-30 ENCOUNTER — Emergency Department
Admission: EM | Admit: 2023-07-30 | Discharge: 2023-07-30 | Disposition: A | Discharge status: Home / Self Care | Attending: Emergency Medicine | Admitting: Emergency Medicine

## 2023-07-30 DIAGNOSIS — R1115 Cyclical vomiting syndrome unrelated to migraine: Secondary | ICD-10-CM | POA: Diagnosis not present

## 2023-07-30 DIAGNOSIS — I10 Essential (primary) hypertension: Secondary | ICD-10-CM | POA: Diagnosis not present

## 2023-07-30 DIAGNOSIS — R000 Tachycardia, unspecified: Secondary | ICD-10-CM | POA: Insufficient documentation

## 2023-07-30 DIAGNOSIS — D72829 Elevated white blood cell count, unspecified: Secondary | ICD-10-CM | POA: Insufficient documentation

## 2023-07-30 DIAGNOSIS — R112 Nausea with vomiting, unspecified: Secondary | ICD-10-CM | POA: Diagnosis present

## 2023-07-30 LAB — CBC WITH DIFFERENTIAL/PLATELET
Abs Immature Granulocytes: 0.15 10*3/uL — ABNORMAL HIGH (ref 0.00–0.07)
Basophils Absolute: 0 10*3/uL (ref 0.0–0.1)
Basophils Relative: 0 %
Eosinophils Absolute: 0 10*3/uL (ref 0.0–0.5)
Eosinophils Relative: 0 %
HCT: 44.9 % (ref 36.0–46.0)
Hemoglobin: 15.2 g/dL — ABNORMAL HIGH (ref 12.0–15.0)
Immature Granulocytes: 1 %
Lymphocytes Relative: 7 %
Lymphs Abs: 1.4 10*3/uL (ref 0.7–4.0)
MCH: 30.8 pg (ref 26.0–34.0)
MCHC: 33.9 g/dL (ref 30.0–36.0)
MCV: 91.1 fL (ref 80.0–100.0)
Monocytes Absolute: 0.4 10*3/uL (ref 0.1–1.0)
Monocytes Relative: 2 %
Neutro Abs: 18 10*3/uL — ABNORMAL HIGH (ref 1.7–7.7)
Neutrophils Relative %: 90 %
Platelets: 368 10*3/uL (ref 150–400)
RBC: 4.93 MIL/uL (ref 3.87–5.11)
RDW: 14.5 % (ref 11.5–15.5)
WBC: 19.9 10*3/uL — ABNORMAL HIGH (ref 4.0–10.5)
nRBC: 0 % (ref 0.0–0.2)

## 2023-07-30 LAB — TSH: TSH: 0.02 u[IU]/mL — ABNORMAL LOW (ref 0.350–4.500)

## 2023-07-30 LAB — COMPREHENSIVE METABOLIC PANEL WITH GFR
ALT: 23 U/L (ref 0–44)
AST: 19 U/L (ref 15–41)
Albumin: 4.6 g/dL (ref 3.5–5.0)
Alkaline Phosphatase: 89 U/L (ref 38–126)
Anion gap: 15 (ref 5–15)
BUN: 16 mg/dL (ref 6–20)
CO2: 16 mmol/L — ABNORMAL LOW (ref 22–32)
Calcium: 9.5 mg/dL (ref 8.9–10.3)
Chloride: 110 mmol/L (ref 98–111)
Creatinine, Ser: 0.71 mg/dL (ref 0.44–1.00)
GFR, Estimated: >60 mL/min (ref >60)
Glucose, Bld: 152 mg/dL — ABNORMAL HIGH (ref 70–99)
Potassium: 3.3 mmol/L — ABNORMAL LOW (ref 3.5–5.1)
Sodium: 141 mmol/L (ref 135–145)
Total Bilirubin: 1 mg/dL (ref 0.0–1.2)
Total Protein: 8.2 g/dL — ABNORMAL HIGH (ref 6.5–8.1)

## 2023-07-30 LAB — LIPASE, BLOOD: Lipase: 25 U/L (ref 11–51)

## 2023-07-30 LAB — T4, FREE: Free T4: 1.28 ng/dL — ABNORMAL HIGH (ref 0.61–1.12)

## 2023-07-30 LAB — HCG, QUANTITATIVE, PREGNANCY: hCG, Beta Chain, Quant, S: <1 m[IU]/mL (ref <5)

## 2023-07-30 MED ORDER — METOCLOPRAMIDE HCL 10 MG PO TABS: 10.0000 mg | ORAL_TABLET | Freq: Three times a day (TID) | ORAL | 0 refills | Status: AC | PRN

## 2023-07-30 MED ORDER — PROCHLORPERAZINE EDISYLATE 10 MG/2ML IJ SOLN
5.0000 mg | Freq: Once | INTRAMUSCULAR | Status: AC
  Administered 2023-07-30: 5 mg via INTRAVENOUS
  Filled 2023-07-30: qty 2

## 2023-07-30 MED ORDER — LACTATED RINGERS IV BOLUS
1000.0000 mL | Freq: Once | INTRAVENOUS | Status: AC
  Administered 2023-07-30: 1000 mL via INTRAVENOUS

## 2023-07-30 MED ORDER — PROPRANOLOL HCL 20 MG PO TABS
60.0000 mg | ORAL_TABLET | Freq: Once | ORAL | Status: AC
  Administered 2023-07-30: 60 mg via ORAL
  Filled 2023-07-30: qty 3

## 2023-07-30 MED ORDER — IOHEXOL 300 MG/ML  SOLN
100.0000 mL | Freq: Once | INTRAMUSCULAR | Status: AC | PRN
  Administered 2023-07-30: 100 mL via INTRAVENOUS

## 2023-07-30 MED ORDER — DROPERIDOL 2.5 MG/ML IJ SOLN
1.2500 mg | Freq: Once | INTRAMUSCULAR | Status: AC
  Administered 2023-07-30: 1.25 mg via INTRAVENOUS
  Filled 2023-07-30: qty 2

## 2023-07-30 NOTE — ED Triage Notes (Signed)
 BIBEMS, vomiting, Pt taking phenergan  and maxalt without relief. PMH: Schuenemann and cyclic vomiting syndrome. 22G R thumb. HR 150s with EMS, now 120-130s.

## 2023-07-30 NOTE — Discharge Instructions (Addendum)
 You may try the Reglan  (metoclopramide ) instead of the promethazine .  Follow-up with your primary care provider.  Return to the ER for new, worsening, or persistent severe vomiting, abdominal pain, elevated heart rate, weakness or lightheadedness, or any other new or worsening symptoms that concern you.

## 2023-07-30 NOTE — ED Provider Notes (Signed)
-----------------------------------------   6:00 PM on 07/30/2023 -----------------------------------------  I took over care on this patient from Dr. Karlynn Oyster.  On reassessment, the patient states she is feeling significantly better.  The nausea has improved and she is now tolerating p.o.  The heart rate has also improved and is now in the 90s.  I did offer admission to the patient for further treatment, however she would strongly prefer to go home.  Given the clinical improvement and her p.o. tolerance, she is appropriate for discharge.  I gave strict return precautions, she expressed understanding.    Lind Repine, MD 07/30/23 [phone]

## 2023-07-30 NOTE — ED Notes (Signed)
 Unable to obtain capture on monitor for ECG tracing despite trouble shooting. Sec to place IT ticket for repair. MD notified as well.

## 2023-07-30 NOTE — ED Triage Notes (Signed)
 First nurse note: Pt to ED via [facility] from home. Pt called for N/V with blood tinge. HR 150 on EMS arrival. Hx of [place] disease.   22g left thumb 500NS  4mg  zofran   BP 136/94 97% RA  99.2 oral  After fluid HR 130s

## 2023-07-30 NOTE — ED Provider Notes (Signed)
 [facility] Provider Note    Event Date/Time   First MD Initiated Contact with Patient 07/30/23 1147     (approximate)   History   Emesis   HPI Theresa Carpenter is a 43 y.o. female with history of HTN, Jech' disease, cyclic vomiting syndrome presenting today for vomiting.  Patient states over the past 2 days she has had recurrence of vomiting symptoms.  Has not been able to keep anything down.  Has taken her home medications without relief.  Received Zofran  en route with EMS with no relief.  Notes generalized abdominal pain that has been worsening.  Otherwise denies cough, congestion, chest pain, shortness of breath, diarrhea, constipation, dysuria.  Chart review: Multiple prior ED visits as well as admissions for cyclic vomiting syndrome.  Also had 1 admission recently for thyrotoxicosis.     Physical Exam   Triage Vital Signs: ED Triage Vitals  Encounter Vitals Group     BP 07/30/23 1138 (!) 199/123     Systolic BP Percentile --      Diastolic BP Percentile --      Pulse Rate 07/30/23 1137 (!) 136     Resp 07/30/23 1137 16     Temp 07/30/23 1132 98.6 F (37 C)     Temp Source 07/30/23 1132 Oral     SpO2 07/30/23 1137 97 %     Weight 07/30/23 1137 204 lb (92.5 kg)     Height 07/30/23 1137 5\' 3"  (1.6 m)     Head Circumference --      Peak Flow --      Pain Score 07/30/23 1137 10     Pain Loc --      Pain Education --      Exclude from Growth Chart --     Most recent vital signs: Vitals:   07/30/23 1230 07/30/23 1539  BP: (!) 181/100 (!) 166/113  Pulse: (!) 101 (!) 137  Resp:    Temp:    SpO2: 97%    Physical Exam: I have reviewed the vital signs and nursing notes. General: Awake, alert, no acute distress.  Nontoxic appearing. Head:  Atraumatic, normocephalic.   ENT:  EOM intact, PERRL. Oral mucosa is pink and moist with no lesions. Neck: Neck is supple with full range of motion, No meningeal signs. Cardiovascular:  RRR, No murmurs.  Peripheral pulses palpable and equal bilaterally. Respiratory:  Symmetrical chest wall expansion.  No rhonchi, rales, or wheezes.  Good air movement throughout.  No use of accessory muscles.   Musculoskeletal:  No cyanosis or edema. Moving extremities with full ROM Abdomen:  Soft, generalized tenderness palpation throughout the abdomen, nondistended. Neuro:  GCS 15, moving all four extremities, interacting appropriately. Speech clear. Psych:  Calm, appropriate.   Skin:  Warm, dry, no rash.    ED Results / Procedures / Treatments   Labs (all labs ordered are listed, but only abnormal results are displayed) Labs Reviewed  CBC WITH DIFFERENTIAL/PLATELET - Abnormal; Notable for the following components:      Result Value   WBC 19.9 (*)    Hemoglobin 15.2 (*)    Neutro Abs 18.0 (*)    Abs Immature Granulocytes 0.15 (*)    All other components within normal limits  COMPREHENSIVE METABOLIC PANEL WITH GFR - Abnormal; Notable for the following components:   Potassium 3.3 (*)    CO2 16 (*)    Glucose, Bld 152 (*)    Total Protein 8.2 (*)  All other components within normal limits  TSH - Abnormal; Notable for the following components:   TSH 0.020 (*)    All other components within normal limits  T4, FREE - Abnormal; Notable for the following components:   Free T4 1.28 (*)    All other components within normal limits  LIPASE, BLOOD  HCG, QUANTITATIVE, PREGNANCY  URINALYSIS, ROUTINE W REFLEX MICROSCOPIC     EKG    RADIOLOGY Independently interpreted CT abdomen/pelvis with no acute pathology   PROCEDURES:  Critical Care performed: No  Procedures   MEDICATIONS ORDERED IN ED: Medications  lactated ringers  bolus 1,000 mL (has no administration in time range)  droperidol  (INAPSINE ) 2.5 MG/ML injection 1.25 mg (1.25 mg Intravenous Given 07/30/23 1208)  lactated ringers  bolus 1,000 mL (0 mLs Intravenous Stopped 07/30/23 1545)  iohexol  (OMNIPAQUE ) 300 MG/ML solution 100 mL (100  mLs Intravenous Contrast Given 07/30/23 1409)  prochlorperazine  (COMPAZINE ) injection 5 mg (5 mg Intravenous Given 07/30/23 1541)  propranolol  (INDERAL ) tablet 60 mg (60 mg Oral Given 07/30/23 1539)     IMPRESSION / MDM / ASSESSMENT AND PLAN / ED COURSE  I reviewed the triage vital signs and the nursing notes.                              Differential diagnosis includes, but is not limited to, cyclic vomiting syndrome, SBO, appendicitis, pancreatitis, cholecystitis, enteritis, dehydration, thyrotoxicosis  Patient's presentation is most consistent with acute presentation with potential threat to life or bodily function.  Patient is a 43 year old female presenting today for nausea and vomiting x 2 days with generalized abdominal pain.  Overall she looks pretty well on exam and does not appear in any acute distress.  She does notably have significant tachycardia between 100 and 130 along with hypertension.  No fever or tachypnea.  Has been taking all her thyroid  medications as prescribed.  Given 1 L fluids and droperidol  on arrival.  Laboratory workup with leukocytosis at 19.9.  CMP most notable for hypocarbia at 16 likely from her dehydration and vomiting.  Otherwise lipase normal and LFTs normal.  Pregnancy test negative.  CT abdomen/pelvis shows no acute intra-abdominal pathology.  Her TSH was slightly low and T4 slightly elevated.  Although, not with significant elevations to obviously point to thyrotoxicosis as a source of today's symptoms and she has been taking her thyroid  medications as prescribed.  She was having ongoing tachycardia and nausea so given additional 1 L fluids, Compazine  as well as 1 dose of propranolol .  Patient signed out oncoming provider pending reevaluation.  If she does not have improvement in symptoms or tachycardia, will likely require admission at that time which she is agreeable with.  The patient is on the cardiac monitor to evaluate for evidence of arrhythmia and/or  significant heart rate changes. Clinical Course as of 07/30/23 1554  Fri Jul 30, 2023  1416 T4 only very mildly elevated with slightly low TSH.  Not overtly indicative of thyrotoxicosis at this time. [DW]  1428 No ongoing vomiting but still having nausea symptoms.  Will give additional liter of fluids as well as Compazine  [DW]  1430 CT ABDOMEN PELVIS W CONTRAST No acute pathology [DW]    Clinical Course User Index [DW] Kandee Orion, MD     FINAL CLINICAL IMPRESSION(S) / ED DIAGNOSES   Final diagnoses:  Cyclic vomiting syndrome  Tachycardia     Rx / DC Orders   ED Discharge  Orders     None        Note:  This document was prepared using Dragon voice recognition software and may include unintentional dictation errors.   Kandee Orion, MD 07/30/23 [phone]

## 2023-08-02 ENCOUNTER — Other Ambulatory Visit: Payer: Self-pay

## 2023-08-02 ENCOUNTER — Emergency Department
Admission: EM | Admit: 2023-08-02 | Discharge: 2023-08-02 | Disposition: A | Discharge status: Home / Self Care | Attending: Emergency Medicine | Admitting: Emergency Medicine

## 2023-08-02 ENCOUNTER — Other Ambulatory Visit (HOSPITAL_COMMUNITY): Payer: Self-pay

## 2023-08-02 DIAGNOSIS — R000 Tachycardia, unspecified: Secondary | ICD-10-CM | POA: Insufficient documentation

## 2023-08-02 DIAGNOSIS — R1115 Cyclical vomiting syndrome unrelated to migraine: Secondary | ICD-10-CM | POA: Insufficient documentation

## 2023-08-02 DIAGNOSIS — Z8679 Personal history of other diseases of the circulatory system: Secondary | ICD-10-CM

## 2023-08-02 DIAGNOSIS — I10 Essential (primary) hypertension: Secondary | ICD-10-CM | POA: Diagnosis not present

## 2023-08-02 DIAGNOSIS — E876 Hypokalemia: Secondary | ICD-10-CM | POA: Insufficient documentation

## 2023-08-02 DIAGNOSIS — R112 Nausea with vomiting, unspecified: Secondary | ICD-10-CM | POA: Diagnosis present

## 2023-08-02 LAB — COMPREHENSIVE METABOLIC PANEL WITH GFR
ALT: 34 U/L (ref 0–44)
AST: 31 U/L (ref 15–41)
Albumin: 4.6 g/dL (ref 3.5–5.0)
Alkaline Phosphatase: 86 U/L (ref 38–126)
Anion gap: 13 (ref 5–15)
BUN: 25 mg/dL — ABNORMAL HIGH (ref 6–20)
CO2: 21 mmol/L — ABNORMAL LOW (ref 22–32)
Calcium: 9.8 mg/dL (ref 8.9–10.3)
Chloride: 106 mmol/L (ref 98–111)
Creatinine, Ser: 0.94 mg/dL (ref 0.44–1.00)
GFR, Estimated: >60 mL/min (ref >60)
Glucose, Bld: 138 mg/dL — ABNORMAL HIGH (ref 70–99)
Potassium: 2.9 mmol/L — ABNORMAL LOW (ref 3.5–5.1)
Sodium: 140 mmol/L (ref 135–145)
Total Bilirubin: 1.8 mg/dL — ABNORMAL HIGH (ref 0.0–1.2)
Total Protein: 8.3 g/dL — ABNORMAL HIGH (ref 6.5–8.1)

## 2023-08-02 LAB — T4, FREE: Free T4: 1.02 ng/dL (ref 0.61–1.12)

## 2023-08-02 LAB — URINE DRUG SCREEN, QUALITATIVE (ARMC ONLY)
Amphetamines, Ur Screen: NOT DETECTED
Barbiturates, Ur Screen: NOT DETECTED
Benzodiazepine, Ur Scrn: NOT DETECTED
Cannabinoid 50 Ng, Ur ~~LOC~~: POSITIVE — AB
Cocaine Metabolite,Ur ~~LOC~~: NOT DETECTED
MDMA (Ecstasy)Ur Screen: NOT DETECTED
Methadone Scn, Ur: NOT DETECTED
Opiate, Ur Screen: NOT DETECTED
Phencyclidine (PCP) Ur S: NOT DETECTED
Tricyclic, Ur Screen: POSITIVE — AB

## 2023-08-02 LAB — URINALYSIS, ROUTINE W REFLEX MICROSCOPIC
Bacteria, UA: NONE SEEN
Bilirubin Urine: NEGATIVE
Glucose, UA: NEGATIVE mg/dL
Hgb urine dipstick: NEGATIVE
Ketones, ur: 80 mg/dL — AB
Leukocytes,Ua: NEGATIVE
Nitrite: NEGATIVE
Protein, ur: 30 mg/dL — AB
Specific Gravity, Urine: 1.031 — ABNORMAL HIGH (ref 1.005–1.030)
pH: 6 (ref 5.0–8.0)

## 2023-08-02 LAB — CBC
HCT: 49.3 % — ABNORMAL HIGH (ref 36.0–46.0)
Hemoglobin: 16.8 g/dL — ABNORMAL HIGH (ref 12.0–15.0)
MCH: 30.7 pg (ref 26.0–34.0)
MCHC: 34.1 g/dL (ref 30.0–36.0)
MCV: 90 fL (ref 80.0–100.0)
Platelets: 359 10*3/uL (ref 150–400)
RBC: 5.48 MIL/uL — ABNORMAL HIGH (ref 3.87–5.11)
RDW: 13.6 % (ref 11.5–15.5)
WBC: 11.4 10*3/uL — ABNORMAL HIGH (ref 4.0–10.5)
nRBC: 0 % (ref 0.0–0.2)

## 2023-08-02 LAB — LIPASE, BLOOD: Lipase: 27 U/L (ref 11–51)

## 2023-08-02 LAB — TSH: TSH: 0.039 u[IU]/mL — ABNORMAL LOW (ref 0.350–4.500)

## 2023-08-02 LAB — MAGNESIUM: Magnesium: 2.6 mg/dL — ABNORMAL HIGH (ref 1.7–2.4)

## 2023-08-02 MED ORDER — METOPROLOL TARTRATE 5 MG/5ML IV SOLN
5.0000 mg | INTRAVENOUS | Status: AC
  Administered 2023-08-02: 5 mg via INTRAVENOUS
  Filled 2023-08-02: qty 5

## 2023-08-02 MED ORDER — PROMETHAZINE HCL 25 MG RE SUPP
25.0000 mg | Freq: Four times a day (QID) | RECTAL | 0 refills | Status: AC | PRN
  Filled 2023-08-02: qty 12, 3d supply, fill #0

## 2023-08-02 MED ORDER — FAMOTIDINE IN NACL 20-0.9 MG/50ML-% IV SOLN
20.0000 mg | Freq: Once | INTRAVENOUS | Status: AC
  Administered 2023-08-02: 20 mg via INTRAVENOUS
  Filled 2023-08-02: qty 50

## 2023-08-02 MED ORDER — POTASSIUM CHLORIDE 10 MEQ/100ML IV SOLN
10.0000 meq | Freq: Once | INTRAVENOUS | Status: AC
  Administered 2023-08-02: 10 meq via INTRAVENOUS
  Filled 2023-08-02: qty 100

## 2023-08-02 MED ORDER — ONDANSETRON 4 MG PO TBDP
4.0000 mg | ORAL_TABLET | Freq: Once | ORAL | Status: AC | PRN
  Administered 2023-08-02: 4 mg via ORAL
  Filled 2023-08-02: qty 1

## 2023-08-02 MED ORDER — SODIUM CHLORIDE 0.9 % IV BOLUS (SEPSIS)
1000.0000 mL | Freq: Once | INTRAVENOUS | Status: AC
  Administered 2023-08-02: 1000 mL via INTRAVENOUS

## 2023-08-02 MED ORDER — PROCHLORPERAZINE EDISYLATE 10 MG/2ML IJ SOLN
10.0000 mg | INTRAMUSCULAR | Status: AC
  Administered 2023-08-02: 10 mg via INTRAVENOUS
  Filled 2023-08-02: qty 2

## 2023-08-02 MED ORDER — PROCHLORPERAZINE MALEATE 10 MG PO TABS
10.0000 mg | ORAL_TABLET | Freq: Four times a day (QID) | ORAL | 0 refills | Status: AC | PRN
Start: 2023-08-02 — End: ?
  Filled 2023-08-02: qty 30, 8d supply, fill #0

## 2023-08-02 NOTE — ED Triage Notes (Addendum)
 Pt to ed from home via [facility] for vomiting and some abd pain. ST on monitor.  EMS vitals: 154/120 99% R A 92 HR  Per EMS "she didn't have no veins for me to try". Pt has been seen here for same in the last 3 days and diagnosed with cyclic vomiting.

## 2023-08-02 NOTE — ED Provider Notes (Signed)
-----------------------------------------   9:13 AM on 08/02/2023 ----------------------------------------- Patient is awake alert.  States she is feeling much better at the moment.  States the Compazine  seem to work well for her.  We will prescribe oral Compazine  as well as Phenergan  suppositories.  I discussed with the patient that her nausea medications all works very similarly and she is only to take 1 every 6 hours regardless of the type of nausea medication including her home nausea medications as this can be dangerous to take more than this amount.  Patient understands.  Will discharge with oral Compazine  as well as rectal Phenergan  to be used if needed.   Ruth Cove, MD 08/02/23 [phone]

## 2023-08-02 NOTE — ED Provider Notes (Signed)
 [facility] Provider Note    Event Date/Time   First MD Initiated Contact with Patient 08/02/23 0518     (approximate)   History   Emesis   HPI  Theresa Carpenter is a 43 y.o. female  with history hyperthyroidism, cyclic vomiting syndrome, hypertension   Patient relates that about a week ago she began having episodes of severe nausea and vomiting.  She was seen at the ER and discharged 3 days ago.  She reports she felt like she was doing okay but by the time she got home she was already vomiting once again.  Reports it has happened to her several times in the past she is not able to keep her medications down feels nauseated due to vomit.  No fevers.  She typically takes medication for her thyroid  and for blood pressure but is not able to keep anything down for the last 2 to 3 days.  She denies pregnancy.  Last pregnancy test was negative just a couple days ago  She did have a CT scan in the ER time as well.  No chest pain or trouble breathing     Physical Exam   Triage Vital Signs: ED Triage Vitals  Encounter Vitals Group     BP 08/02/23 0231 (!) 179/139     Systolic BP Percentile --      Diastolic BP Percentile --      Pulse Rate 08/02/23 0231 95     Resp 08/02/23 0231 16     Temp 08/02/23 0231 98.5 F (36.9 C)     Temp Source 08/02/23 0231 Oral     SpO2 08/02/23 0231 97 %     Weight 08/02/23 0229 204 lb (92.5 kg)     Height 08/02/23 0229 5\' 3"  (1.6 m)     Head Circumference --      Peak Flow --      Pain Score 08/02/23 0229 4     Pain Loc --      Pain Education --      Exclude from Growth Chart --     Most recent vital signs: Vitals:   08/02/23 0700 08/02/23 0729  BP: (!) 186/120   Pulse: 66   Resp:    Temp:  98 F (36.7 C)  SpO2: 96%      General: Awake, no distress.  Patient is with emesis bag in hand.  Appears nauseated.  She is pleasant but appears also somewhat fatigued CV:  Good peripheral perfusion.  Very mild  tachycardia normal times Resp:  Normal effort.  Clear bilateral normal work of breathing Abd:  No distention.  No focal abdominal tenderness, reports more of a nauseated feeling.  No obvious tenderness to examination Other:  Fully alert and well oriented   ED Results / Procedures / Treatments   Labs (all labs ordered are listed, but only abnormal results are displayed) Labs Reviewed  COMPREHENSIVE METABOLIC PANEL WITH GFR - Abnormal; Notable for the following components:      Result Value   Potassium 2.9 (*)    CO2 21 (*)    Glucose, Bld 138 (*)    BUN 25 (*)    Total Protein 8.3 (*)    Total Bilirubin 1.8 (*)    All other components within normal limits  CBC - Abnormal; Notable for the following components:   WBC 11.4 (*)    RBC 5.48 (*)    Hemoglobin 16.8 (*)    HCT 49.3 (*)  All other components within normal limits  URINALYSIS, ROUTINE W REFLEX MICROSCOPIC - Abnormal; Notable for the following components:   Color, Urine YELLOW (*)    APPearance HAZY (*)    Specific Gravity, Urine 1.031 (*)    Ketones, ur 80 (*)    Protein, ur 30 (*)    All other components within normal limits  TSH - Abnormal; Notable for the following components:   TSH 0.039 (*)    All other components within normal limits  MAGNESIUM  - Abnormal; Notable for the following components:   Magnesium  2.6 (*)    All other components within normal limits  LIPASE, BLOOD  T4, FREE  URINE DRUG SCREEN, QUALITATIVE ([facility] ONLY)  POC URINE PREG, ED   Labs are notable for hypokalemia.  CO2 21 improved from previous evaluation and slightly elevated total bilirubin with LFTs.  Slight leukocytosis with elevated hemoglobin suspicious for hemoconcentration.  TSH barely slightly higher then prior with normal free T4.   EKG  Interpreted by me.  Normal sinus rhythm heart rate 90.  Normal QRS.  There is a very slight T wave abnormality throughout.  No associated chest pain I would suspect this is unlikely related to  ACS, underlying electrolyte abnormality would be considered   RADIOLOGY  No noted indication for repeat imaging.  Patient had CT scan for same symptoms less than a week ago as below reviewed  CT ABDOMEN PELVIS W CONTRAST Result Date: 07/30/2023 CLINICAL DATA:  Abdominal pain, nausea vomiting. EXAM: CT ABDOMEN AND PELVIS WITH CONTRAST TECHNIQUE: Multidetector CT imaging of the abdomen and pelvis was performed using the standard protocol following bolus administration of intravenous contrast. RADIATION DOSE REDUCTION: This exam was performed according to the departmental dose-optimization program which includes automated exposure control, adjustment of the mA and/or kV according to patient size and/or use of iterative reconstruction technique. CONTRAST:  OMNIPAQUE  IOHEXOL  300 MG/ML  SOLN COMPARISON:  CT abdomen pelvis dated 08/27/2022. FINDINGS: Lower chest: The visualized lung bases are clear. No intra-abdominal free air or free fluid. Hepatobiliary: The liver is unremarkable. No biliary dilatation. Cholecystectomy. Pancreas: Unremarkable. No pancreatic ductal dilatation or surrounding inflammatory changes. Spleen: Normal in size without focal abnormality. Adrenals/Urinary Tract: The right adrenal gland is unremarkable. Indeterminate 15 mm left adrenal nodule similar to prior CT. There is no hydronephrosis on either side. The visualized ureters appear unremarkable. The urinary bladder is collapsed. Stomach/Bowel: There is no bowel obstruction or active inflammation. The appendix is normal. Vascular/Lymphatic: Mild atherosclerotic calcification of the abdominal aorta. The IVC is unremarkable. No portal venous gas. There is no adenopathy. Reproductive: The uterus is grossly unremarkable. No suspicious adnexal masses. Other: None Musculoskeletal: No acute or significant osseous findings. IMPRESSION: 1. No acute intra-abdominal or pelvic pathology. 2.  Aortic Atherosclerosis (ICD10-I70.0). Electronically  Signed   By: Angus Bark M.D.   On: 07/30/2023 14:27      PROCEDURES:  Critical Care performed: No  Procedures   MEDICATIONS ORDERED IN ED: Medications  ondansetron  (ZOFRAN -ODT) disintegrating tablet 4 mg (4 mg Oral Given 08/02/23 0236)  sodium chloride  0.9 % bolus 1,000 mL (0 mLs Intravenous Stopped 08/02/23 0530)  prochlorperazine  (COMPAZINE ) injection 10 mg (10 mg Intravenous Given 08/02/23 0554)  metoprolol  tartrate (LOPRESSOR ) injection 5 mg (5 mg Intravenous Given 08/02/23 0553)  famotidine  (PEPCID ) IVPB 20 mg premix (0 mg Intravenous Stopped 08/02/23 0624)  potassium chloride  10 mEq in 100 mL IVPB (0 mEq Intravenous Stopped 08/02/23 0723)     IMPRESSION / MDM /  ASSESSMENT AND PLAN / ED COURSE  I reviewed the triage vital signs and the nursing notes.                              Differential diagnosis includes, but is not limited to, recurrent cyclical vomiting, viral syndrome, gastritis, electrolyte abnormality dehydration, etc.  Based on the patient's previous evaluation just a couple days ago reassuring CT imaging and her exam today I suspect this is most likely a recurrent cyclical vomiting picture.  She is hypertensive but carries a history and has not been able to [employer].  Additionally she has a history of hyperthyroidism though her laboratory evaluation and trend in free T4 and TSH is reassuring.  She has no central neurologic features no headache.  Provide multiple antiemetics, IV fluid, replete potassium with at least 1 IV run until she is able to take by mouth etc.  The patient has a somewhat difficult case of intractable nausea and vomiting.  She has been treated for similar in the past and at this point I doubt acute changes that would include concerning or catastrophic causes.  However I am concerned about dehydration hypokalemia, mild T wave abnormality etc.    Patient's presentation is most consistent with acute complicated illness / injury requiring diagnostic  workup.     Ongoing care assigned to Dr. Azalee Bolds.  Patient with recurrent nausea vomiting receiving multiple antiemetics, also treating hypertension.  Plan to reevaluate after treatments, disposition pending patient evaluation and symptomatology. May require admission given recent ED visit and recurrence, but not yet elucidated.      FINAL CLINICAL IMPRESSION(S) / ED DIAGNOSES   Final diagnoses:  Persistent recurrent vomiting  Hypokalemia  History of uncontrolled hypertension     Rx / DC Orders   ED Discharge Orders     None        Note:  This document was prepared using Dragon voice recognition software and may include unintentional dictation errors.   Iver Marker, MD 08/02/23 [phone]

## 2023-08-02 NOTE — ED Notes (Signed)
 Patient was unable to keep down her blood pressure medications last night and unable to keep down nausea medications.

## 2023-08-23 ENCOUNTER — Telehealth: Payer: Self-pay | Admitting: *Deleted

## 2023-08-23 DIAGNOSIS — I10 Essential (primary) hypertension: Secondary | ICD-10-CM

## 2023-08-23 NOTE — Progress Notes (Unsigned)
 Complex Care Management Note Care Guide Note  08/23/2023 Name: ALAYZA PIEPER MRN: [id number] DOB: 12/26/1980   Complex Care Management Outreach Attempts: An unsuccessful telephone outreach was attempted today to offer the patient information about available complex care management services.  Follow Up Plan:  Additional outreach attempts will be made to offer the patient complex care management information and services.   Encounter Outcome:  No Answer  Harlene Satterfield  [facility]  [facility], [facility] Guide  Direct Dial: [phone]  Fax [phone]

## 2023-08-24 NOTE — Progress Notes (Signed)
 Complex Care Management Note  Care Guide Note 08/24/2023 Name: ALYSSE RATHE MRN: [id number] DOB: 10/13/1980  Jesusa LULLA Gavel is a 43 y.o. year old female who sees Geralene Levorn ORN, NP for primary care. I reached out to Jesusa LULLA Gavel by phone today to offer complex care management services.  Ms. Wileman was given information about Complex Care Management services today including:   The Complex Care Management services include support from the care team which includes your Nurse Care Manager, Clinical Social Worker, or Pharmacist.  The Complex Care Management team is here to help remove barriers to the health concerns and goals most important to you. Complex Care Management services are voluntary, and the patient may decline or stop services at any time by request to their care team member.   Complex Care Management Consent Status: Patient agreed to services and verbal consent obtained.   Follow up plan:  Telephone appointment with complex care management team member scheduled for:  7/24  Encounter Outcome:  Patient Scheduled  Harlene Satterfield  [facility]  [facility], [facility] Guide  Direct Dial: [phone]  Fax [phone]

## 2023-08-24 NOTE — Progress Notes (Signed)
 Complex Care Management Note Care Guide Note  08/24/2023 Name: EMERIE VANDERKOLK MRN: [id number] DOB: 1980/05/15   Complex Care Management Outreach Attempts: A second unsuccessful outreach was attempted today to offer the patient with information about available complex care management services.  Follow Up Plan:  Additional outreach attempts will be made to offer the patient complex care management information and services.   Encounter Outcome:  No Answer  Harlene Satterfield  [facility]  [facility], [facility] Guide  Direct Dial: [phone]  Fax [phone]

## 2023-09-09 ENCOUNTER — Telehealth: Payer: Self-pay | Admitting: *Deleted

## 2023-09-09 NOTE — Progress Notes (Signed)
 Complex Care Management Care Guide Note  09/09/2023 Name: [name] MRN: [id number] DOB: 04-Sep-1980  Theresa Carpenter is a 43 y.o. year old female who is a primary care patient of Geralene Levorn ORN, NP and is actively engaged with the care management team. I reached out to Theresa Carpenter by phone today to assist with canceling   with the RN Case Manager. Patient wishes not to reschedule she will call back if needed. No further outreach attempts will be made at this time.  Follow up plan: None   Harlene Satterfield  [facility], [facility] Guide  Direct Dial: [phone]  Fax [phone]

## 2023-09-16 ENCOUNTER — Telehealth

## 2023-09-23 ENCOUNTER — Ambulatory Visit: Admitting: "Endocrinology

## 2023-12-31 ENCOUNTER — Other Ambulatory Visit: Payer: Self-pay

## 2023-12-31 DIAGNOSIS — Z1231 Encounter for screening mammogram for malignant neoplasm of breast: Secondary | ICD-10-CM

## 2024-01-14 ENCOUNTER — Ambulatory Visit
Admission: RE | Admit: 2024-01-14 | Discharge: 2024-01-14 | Disposition: A | Discharge status: Home / Self Care | Source: Ambulatory Visit

## 2024-01-14 DIAGNOSIS — Z1231 Encounter for screening mammogram for malignant neoplasm of breast: Secondary | ICD-10-CM | POA: Insufficient documentation

## 2024-01-19 ENCOUNTER — Other Ambulatory Visit: Payer: Self-pay

## 2024-01-19 DIAGNOSIS — I10 Essential (primary) hypertension: Secondary | ICD-10-CM

## 2024-01-25 ENCOUNTER — Ambulatory Visit: Admission: RE | Admit: 2024-01-25 | Discharge: 2024-01-25

## 2024-01-25 DIAGNOSIS — I10 Essential (primary) hypertension: Secondary | ICD-10-CM | POA: Insufficient documentation

## 2024-01-27 ENCOUNTER — Other Ambulatory Visit: Payer: Self-pay

## 2024-01-27 DIAGNOSIS — R928 Other abnormal and inconclusive findings on diagnostic imaging of breast: Secondary | ICD-10-CM

## 2024-02-01 ENCOUNTER — Other Ambulatory Visit

## 2024-02-01 ENCOUNTER — Inpatient Hospital Stay: Admission: RE | Admit: 2024-02-01

## 2024-02-03 ENCOUNTER — Inpatient Hospital Stay: Admission: RE | Admit: 2024-02-03 | Discharge: 2024-02-03

## 2024-02-03 DIAGNOSIS — R928 Other abnormal and inconclusive findings on diagnostic imaging of breast: Secondary | ICD-10-CM | POA: Diagnosis present

## 2024-02-09 ENCOUNTER — Other Ambulatory Visit: Payer: Self-pay

## 2024-02-09 DIAGNOSIS — N6489 Other specified disorders of breast: Secondary | ICD-10-CM

## 2024-02-09 DIAGNOSIS — N63 Unspecified lump in unspecified breast: Secondary | ICD-10-CM

## 2024-04-03 ENCOUNTER — Ambulatory Visit: Admission: RE | Admit: 2024-04-03 | Source: Home / Self Care

## 2024-04-03 ENCOUNTER — Encounter: Admission: RE | Payer: Self-pay | Source: Home / Self Care

## 2024-08-09 ENCOUNTER — Other Ambulatory Visit

## 2024-08-09 ENCOUNTER — Encounter
